# Patient Record
Sex: Female | Born: 1960
Health system: Southern US, Community
[De-identification: ages and names within clinical notes are randomized; demographics above are authoritative.]

## PROBLEM LIST (undated history)

## (undated) DIAGNOSIS — F418 Other specified anxiety disorders: Secondary | ICD-10-CM

## (undated) DIAGNOSIS — K219 Gastro-esophageal reflux disease without esophagitis: Secondary | ICD-10-CM

## (undated) DIAGNOSIS — M069 Rheumatoid arthritis, unspecified: Secondary | ICD-10-CM

## (undated) DIAGNOSIS — J309 Allergic rhinitis, unspecified: Secondary | ICD-10-CM

## (undated) DIAGNOSIS — L719 Rosacea, unspecified: Secondary | ICD-10-CM

## (undated) DIAGNOSIS — I4510 Unspecified right bundle-branch block: Secondary | ICD-10-CM

## (undated) HISTORY — PX: BREAST EXCISIONAL BIOPSY: SUR124

## (undated) HISTORY — DX: Gastro-esophageal reflux disease without esophagitis: K21.9

## (undated) HISTORY — PX: OTHER SURGICAL HISTORY: SHX169

## (undated) HISTORY — DX: Rheumatoid arthritis, unspecified: M06.9

## (undated) HISTORY — DX: Rosacea, unspecified: L71.9

## (undated) HISTORY — DX: Unspecified right bundle-branch block: I45.10

## (undated) HISTORY — DX: Allergic rhinitis, unspecified: J30.9

## (undated) HISTORY — DX: Other specified anxiety disorders: F41.8

---

## 1998-09-10 ENCOUNTER — Other Ambulatory Visit: Admission: RE | Admit: 1998-09-10 | Discharge: 1998-09-10 | Payer: Self-pay | Admitting: Obstetrics and Gynecology

## 1999-06-29 ENCOUNTER — Ambulatory Visit (HOSPITAL_COMMUNITY): Admission: RE | Admit: 1999-06-29 | Discharge: 1999-06-29 | Payer: Self-pay | Admitting: Family Medicine

## 1999-06-29 ENCOUNTER — Encounter: Payer: Self-pay | Admitting: Family Medicine

## 1999-11-19 ENCOUNTER — Ambulatory Visit (HOSPITAL_COMMUNITY): Admission: RE | Admit: 1999-11-19 | Discharge: 1999-11-19 | Payer: Self-pay | Admitting: Obstetrics and Gynecology

## 1999-11-19 ENCOUNTER — Encounter (INDEPENDENT_AMBULATORY_CARE_PROVIDER_SITE_OTHER): Payer: Self-pay

## 2000-08-30 ENCOUNTER — Other Ambulatory Visit: Admission: RE | Admit: 2000-08-30 | Discharge: 2000-08-30 | Payer: Self-pay | Admitting: Obstetrics and Gynecology

## 2002-02-19 ENCOUNTER — Other Ambulatory Visit: Admission: RE | Admit: 2002-02-19 | Discharge: 2002-02-19 | Payer: Self-pay | Admitting: Obstetrics and Gynecology

## 2003-01-24 ENCOUNTER — Ambulatory Visit (HOSPITAL_COMMUNITY): Admission: RE | Admit: 2003-01-24 | Discharge: 2003-01-24 | Payer: Self-pay | Admitting: Obstetrics and Gynecology

## 2003-01-24 ENCOUNTER — Encounter: Payer: Self-pay | Admitting: Obstetrics and Gynecology

## 2003-03-14 ENCOUNTER — Inpatient Hospital Stay (HOSPITAL_COMMUNITY): Admission: AD | Admit: 2003-03-14 | Discharge: 2003-03-14 | Payer: Self-pay | Admitting: Obstetrics and Gynecology

## 2003-03-24 ENCOUNTER — Encounter: Payer: Self-pay | Admitting: Obstetrics and Gynecology

## 2003-03-24 ENCOUNTER — Ambulatory Visit (HOSPITAL_COMMUNITY): Admission: RE | Admit: 2003-03-24 | Discharge: 2003-03-24 | Payer: Self-pay | Admitting: Obstetrics and Gynecology

## 2003-05-01 ENCOUNTER — Inpatient Hospital Stay (HOSPITAL_COMMUNITY): Admission: AD | Admit: 2003-05-01 | Discharge: 2003-05-01 | Payer: Self-pay | Admitting: Obstetrics and Gynecology

## 2003-05-01 ENCOUNTER — Encounter: Payer: Self-pay | Admitting: Obstetrics and Gynecology

## 2003-06-07 ENCOUNTER — Inpatient Hospital Stay (HOSPITAL_COMMUNITY): Admission: AD | Admit: 2003-06-07 | Discharge: 2003-06-09 | Payer: Self-pay | Admitting: Obstetrics and Gynecology

## 2003-10-06 ENCOUNTER — Other Ambulatory Visit: Admission: RE | Admit: 2003-10-06 | Discharge: 2003-10-06 | Payer: Self-pay | Admitting: Obstetrics and Gynecology

## 2004-11-30 ENCOUNTER — Other Ambulatory Visit: Admission: RE | Admit: 2004-11-30 | Discharge: 2004-11-30 | Payer: Self-pay | Admitting: Obstetrics and Gynecology

## 2004-12-21 ENCOUNTER — Ambulatory Visit (HOSPITAL_COMMUNITY): Admission: RE | Admit: 2004-12-21 | Discharge: 2004-12-21 | Payer: Self-pay | Admitting: Obstetrics and Gynecology

## 2006-02-21 ENCOUNTER — Other Ambulatory Visit: Admission: RE | Admit: 2006-02-21 | Discharge: 2006-02-21 | Payer: Self-pay | Admitting: Obstetrics and Gynecology

## 2006-03-22 ENCOUNTER — Ambulatory Visit (HOSPITAL_COMMUNITY): Admission: RE | Admit: 2006-03-22 | Discharge: 2006-03-22 | Payer: Self-pay | Admitting: Obstetrics and Gynecology

## 2006-10-06 ENCOUNTER — Ambulatory Visit (HOSPITAL_COMMUNITY): Admission: RE | Admit: 2006-10-06 | Discharge: 2006-10-06 | Payer: Self-pay | Admitting: *Deleted

## 2006-10-25 ENCOUNTER — Ambulatory Visit (HOSPITAL_COMMUNITY): Admission: RE | Admit: 2006-10-25 | Discharge: 2006-10-25 | Payer: Self-pay | Admitting: Gastroenterology

## 2006-11-06 ENCOUNTER — Encounter (INDEPENDENT_AMBULATORY_CARE_PROVIDER_SITE_OTHER): Payer: Self-pay | Admitting: *Deleted

## 2006-11-06 ENCOUNTER — Ambulatory Visit (HOSPITAL_COMMUNITY): Admission: RE | Admit: 2006-11-06 | Discharge: 2006-11-06 | Payer: Self-pay | Admitting: Gastroenterology

## 2008-02-08 ENCOUNTER — Ambulatory Visit (HOSPITAL_COMMUNITY): Admission: RE | Admit: 2008-02-08 | Discharge: 2008-02-08 | Payer: Self-pay | Admitting: Obstetrics and Gynecology

## 2009-12-29 ENCOUNTER — Ambulatory Visit (HOSPITAL_COMMUNITY): Admission: RE | Admit: 2009-12-29 | Discharge: 2009-12-29 | Payer: Self-pay | Admitting: Obstetrics and Gynecology

## 2010-01-03 ENCOUNTER — Emergency Department (HOSPITAL_COMMUNITY): Admission: EM | Admit: 2010-01-03 | Discharge: 2010-01-03 | Payer: Self-pay | Admitting: Emergency Medicine

## 2010-01-12 ENCOUNTER — Encounter: Admission: RE | Admit: 2010-01-12 | Discharge: 2010-01-12 | Payer: Self-pay | Admitting: Obstetrics and Gynecology

## 2010-02-09 ENCOUNTER — Ambulatory Visit (HOSPITAL_COMMUNITY): Admission: RE | Admit: 2010-02-09 | Discharge: 2010-02-09 | Payer: Self-pay | Admitting: Family Medicine

## 2010-11-21 ENCOUNTER — Encounter: Payer: Self-pay | Admitting: Obstetrics and Gynecology

## 2011-03-18 NOTE — Op Note (Signed)
NAMEAMELIA, BURGARD               ACCOUNT NO.:  [id number]   MEDICAL RECORD NO.:  [id number]          PATIENT TYPE:  AMB   LOCATION:  ENDO                         FACILITY:  [facility]   PHYSICIAN:  Petra Kuba, M.D.    DATE OF BIRTH:  1961/09/06   DATE OF PROCEDURE:  11/06/2006  DATE OF DISCHARGE:                               OPERATIVE REPORT   SURGEON:  Petra Kuba, M.D.   PROCEDURE:  Esophagogastroduodenoscopy with biopsy.   INDICATIONS:  Longstanding reflux.   CONSENT:  Consent was signed after risks, benefits, methods and options  were thoroughly discussed in the office.   MEDICINES USED:  Fentanyl 50, Versed 5.   DESCRIPTION OF PROCEDURE:  The video endoscope was inserted by direct  vision.  The esophagus was normal.  Maybe she had a tiny hiatal hernia,  but the esophagus was normal.  The scope passed into the stomach,  advanced through a normal antrum, normal pylorus into a normal duodenal  bulb and around the C loop to a normal second portion of the duodenum.  The scope was withdrawn back to the bulb, and a good look there ruled  out abnormalities in that location.  The scope was withdrawn back into  the stomach and retroflexed.  The cardia, fundus, angularis, lesser and  greater curve were all normal, except for a few fundic gland polyps or  hyperplastic-appearing polyps in the proximal stomach, all of which were  cold biopsied.  I believe there were 2 small and 3 tiny.  Straight  visualization of the stomach did not reveal any additional findings.  Air was suctioned.  The scope was slowly withdrawn, and, again, a good  look at the esophagus was normal.  The scope was removed.  The patient  tolerated the procedure well.  There was no obvious immediate  complication.   ENDOSCOPIC DIAGNOSES:  1. Questionable tiny hiatal hernia.  2. A few proximal gastric proximal, status post biopsy.  3. Otherwise, normal esophagogastroduodenoscopy.   PLAN:  1. Continue Protonix,  and change her to p.m. dosing, since most of her      symptoms are first thing in the morning.  2. Await pathology.  3. Call me p.r.n., otherwise, follow up in 2 months.  4. Possibly a nighttime dose of Reglan only would be hopeful.  5. Have her call me soon p.r.n.           ______________________________  Petra Kuba, M.D.     MEM/MEDQ  D:  11/06/2006  T:  11/06/2006  Job:  [id number]   cc:   Holley Bouche, M.D.

## 2011-03-18 NOTE — H&P (Signed)
NAME:  Gina Mitchell, Gina Mitchell                         ACCOUNT NO.:  [id number]   MEDICAL RECORD NO.:  [id number]                   PATIENT TYPE:  INP   LOCATION:  9162                                 FACILITY:  [facility]   PHYSICIAN:  Hal Morales, M.D.             DATE OF BIRTH:  09/03/1961   DATE OF ADMISSION:  06/07/2003  DATE OF DISCHARGE:                                HISTORY & PHYSICAL   HISTORY OF PRESENT ILLNESS:  Ms. Dutson is a 50 year old, married, Asian  female, G4, P2-0-1-2 at 38-5/7 weeks by ultrasound who presents complaining  of uterine contractions every five minutes throughout the night.  She denies  any leaking or vaginal bleeding.  She reports positive fetal movement.  She  denies any nausea, vomiting, headaches or visual disturbances.  Her  pregnancy has been followed at [facility] by the M.D. service  and has been at risk for advanced maternal age with no amniocentesis,  history of macrosomia with a previous delivery and a history of cervical  polyp.  Her Group B Streptococcus is negative with this pregnancy.  She  desires an epidural for labor with this pregnancy.   PAST OBSTETRICAL HISTORY:  She is a G4, P2-0-1-2 who delivered a viable  female infant in May 1996, who weighed 7 pounds 14 ounces at 40 weeks'  gestation following a 24-hour labor.  In December 1997, she delivered  another viable female infant that weighed 9 pounds 6 ounces at 40 weeks'  gestation following an 18-hour labor.  It is noted that she had some  shoulder dystocia with that delivery, but the patient does not remember  anything being a problem with the delivery.  In January 2001, she reports a  miscarriage with no complications.   PAST GYNECOLOGICAL HISTORY:  She has a history of cervical polyps.  No other  issues.   ALLERGIES:  CRAB gives her swelling.  IBUPROFEN gives her a rash.   PAST MEDICAL HISTORY:  1. She reports having had the usual childhood diseases.  2. She has  occasional urinary tract infection.  3. History of reflux and hiatal hernia.  4. Hospitalizations have been for childbirth only.   PAST SURGICAL HISTORY:  1. Right breast cyst removed in 1993.  2. D&E in 2001.   FAMILY HISTORY:  Significant for father with MI.  Mother with chronic  hypertension and varicosities.  Sister with thyroid dysfunction, otherwise  negative.   GENETIC HISTORY:  Negative, except the patient is age 59 with this  pregnancy.   PRENATAL LABORATORY DATA:  Blood type B positive, antibody screen negative.  Syphilis nonreactive.  Rubella immune.  Hepatitis B surface antigen is  negative.  GC and Chlamydia are both negative.  Her one-hour Glucola was  140.  Her three-hour GTT was within normal range.  Her 36-week beta strep  was negative.   PHYSICAL EXAMINATION:  VITAL SIGNS:  Stable.  She is afebrile.  HEENT:  Grossly within normal limits.  HEART:  Regular rate and rhythm.  CHEST:  Clear.  BREASTS:  Soft and nontender.  ABDOMEN:  Gravid with uterine contractions every five to six minutes.  Her  fetal heart rate is reactive and reassuring.  PELVIC:  Her pelvic exam is 4, 90%, vertex, -1 and bulging membranes.  EXTREMITIES:  Within normal limits.   ASSESSMENT:  1. Intrauterine pregnancy at term.  2. Early active labor.  3. Negative Group B Streptococcus.  4. The patient plans epidural for labor.   PLAN:  1. Admit to labor and delivery to follow routine physician orders.  2. Dr. Pennie Rushing has been notified of the patient's admission and will follow     the patient.  3. The patient may have an epidural when she desires.     Concha Pyo. Duplantis, C.N.M.              Hal Morales, M.D.    SJD/MEDQ  D:  06/07/2003  T:  06/07/2003  Job:  [id number]

## 2011-03-18 NOTE — Op Note (Signed)
[facility]  Patient:    Gina Mitchell                         MRN: [id number] Proc. Date: 11/19/99 Adm. Date:  [id number] Disc. Date: [id number] Attending:  Leonard Schwartz                           Operative Report  PREOPERATIVE DIAGNOSIS:       Missed abortion at 9 weeks.  POSTOPERATIVE DIAGNOSIS:      Missed abortion at 9 weeks.  PROCEDURE:                    Suction, dilatation and evacuation.  SURGEON:                      Janine Limbo, M.D.  ANESTHESIA:                   IV sedation and paracervical block.  DISPOSITION:                  Ms. Berhe is a 50 year old who presents at 9-weeks gestation with abnormal quantitative beta hCG values and with no IUP on ultrasound. Patient understands the indications for her procedure and she accepts the risks of, but not limited to, anesthetic complications, bleeding, infections and possible  damage to the surrounding organs.  FINDINGS:                     The patient is Rh-positive.  A moderate amount of  products of conception were removed from within a 10-week-size uterus.  DESCRIPTION OF PROCEDURE:     The patient was taken to the operating room where she was given medication through her IV line.  The patients abdomen, perineum and vagina were prepped with multiple layers of Betadine.  She was sterilely draped. Examination under anesthesia was performed.  A paracervical block was placed using 10 cc of 1% Xylocaine.  The uterus sounded to 11 cm.  The cervix was dilated. he uterine cavity was evacuated using a #10 suction curette and then a medium sharp curette.  The cavity was felt to be clean at the end of our procedure.  The patient tolerated her procedure well.  There was some bleeding from the cervix and a figure-of-eight suture of 0 Vicryl was placed.  Hemostasis was then felt to be adequate.  Sponge, needle and instrument counts were correct.  The  estimated blood loss was 10 cc.  Patient tolerated her procedure well.  She was taken to the recovery room in stable condition. DD:  11/19/99 TD:  11/23/99 Job: [id number]

## 2011-04-12 ENCOUNTER — Other Ambulatory Visit (HOSPITAL_COMMUNITY): Payer: Self-pay | Admitting: Obstetrics and Gynecology

## 2011-04-12 DIAGNOSIS — Z1231 Encounter for screening mammogram for malignant neoplasm of breast: Secondary | ICD-10-CM

## 2011-04-20 ENCOUNTER — Ambulatory Visit (HOSPITAL_COMMUNITY)
Admission: RE | Admit: 2011-04-20 | Discharge: 2011-04-20 | Disposition: A | Discharge status: Home / Self Care | Payer: Commercial Managed Care - PPO | Source: Ambulatory Visit | Attending: Obstetrics and Gynecology | Admitting: Obstetrics and Gynecology

## 2011-04-20 DIAGNOSIS — Z1231 Encounter for screening mammogram for malignant neoplasm of breast: Secondary | ICD-10-CM

## 2011-05-24 ENCOUNTER — Ambulatory Visit (HOSPITAL_COMMUNITY)
Admission: RE | Admit: 2011-05-24 | Discharge: 2011-05-24 | Disposition: A | Discharge status: Home / Self Care | Payer: 59 | Source: Ambulatory Visit | Attending: Family Medicine | Admitting: Family Medicine

## 2011-05-24 ENCOUNTER — Other Ambulatory Visit (HOSPITAL_COMMUNITY): Payer: Self-pay | Admitting: Family Medicine

## 2011-05-24 DIAGNOSIS — D259 Leiomyoma of uterus, unspecified: Secondary | ICD-10-CM | POA: Insufficient documentation

## 2011-05-24 DIAGNOSIS — N83209 Unspecified ovarian cyst, unspecified side: Secondary | ICD-10-CM | POA: Insufficient documentation

## 2011-05-24 DIAGNOSIS — R1032 Left lower quadrant pain: Secondary | ICD-10-CM | POA: Insufficient documentation

## 2011-05-24 DIAGNOSIS — N926 Irregular menstruation, unspecified: Secondary | ICD-10-CM | POA: Insufficient documentation

## 2012-09-24 ENCOUNTER — Other Ambulatory Visit: Payer: Self-pay | Admitting: Obstetrics and Gynecology

## 2012-09-24 DIAGNOSIS — Z1231 Encounter for screening mammogram for malignant neoplasm of breast: Secondary | ICD-10-CM

## 2012-10-01 ENCOUNTER — Ambulatory Visit (HOSPITAL_COMMUNITY)
Admission: RE | Admit: 2012-10-01 | Discharge: 2012-10-01 | Disposition: A | Discharge status: Home / Self Care | Payer: 59 | Source: Ambulatory Visit | Attending: Obstetrics and Gynecology | Admitting: Obstetrics and Gynecology

## 2012-10-01 DIAGNOSIS — Z1231 Encounter for screening mammogram for malignant neoplasm of breast: Secondary | ICD-10-CM | POA: Insufficient documentation

## 2012-10-04 ENCOUNTER — Encounter: Payer: Self-pay | Admitting: Obstetrics and Gynecology

## 2012-12-15 ENCOUNTER — Other Ambulatory Visit: Payer: Self-pay

## 2013-09-05 ENCOUNTER — Other Ambulatory Visit: Payer: Self-pay

## 2013-11-25 ENCOUNTER — Encounter: Payer: Self-pay | Admitting: Sports Medicine

## 2013-11-25 ENCOUNTER — Ambulatory Visit (INDEPENDENT_AMBULATORY_CARE_PROVIDER_SITE_OTHER): Payer: Commercial Managed Care - PPO | Admitting: Sports Medicine

## 2013-11-25 VITALS — BP 126/87 | HR 57 | Wt 119.0 lb

## 2013-11-25 DIAGNOSIS — M722 Plantar fascial fibromatosis: Secondary | ICD-10-CM

## 2013-11-25 MED ORDER — MELOXICAM 15 MG PO TABS: ORAL_TABLET | ORAL | Status: DC

## 2013-11-25 NOTE — Assessment & Plan Note (Signed)
We will start conservatively with Mobic, home rehabilitation, she will come back for custom orthotics.

## 2013-11-25 NOTE — Progress Notes (Signed)
  Subjective:    CC: Bilateral heel pain  HPI:  For the past several weeks this pleasant 52 year old female has noted pain that she localizes at both heels, worse in the morning, moderate, persistent without radiation. No trauma.  Past medical history, Surgical history, Family history not pertinant except as noted below, Social history, Allergies, and medications have been entered into the medical record, reviewed, and no changes needed.   Review of Systems: No headache, visual changes, nausea, vomiting, diarrhea, constipation, dizziness, abdominal pain, skin rash, fevers, chills, night sweats, swollen lymph nodes, weight loss, chest pain, body aches, joint swelling, muscle aches, shortness of breath, mood changes, visual or auditory hallucinations.  Objective:    General: Well Developed, well nourished, and in no acute distress.  Neuro: Alert and oriented x3, extra-ocular muscles intact, sensation grossly intact.  HEENT: Normocephalic, atraumatic, pupils equal round reactive to light, neck supple, no masses, no lymphadenopathy, thyroid nonpalpable.  Skin: Warm and dry, no rashes noted.  Cardiac: Regular rate and rhythm, no murmurs rubs or gallops.  Respiratory: Clear to auscultation bilaterally. Not using accessory muscles, speaking in full sentences.  Abdominal: Soft, nontender, nondistended, positive bowel sounds, no masses, no organomegaly.  Bilateral Foot: No visible erythema or swelling. Range of motion is full in all directions. Strength is 5/5 in all directions. No hallux valgus. No pes cavus or pes planus. Bilateral pes cavus.  No abnormal callus noted. No pain over the navicular prominence, or base of fifth metatarsal. Tender to palpation of the calcaneal insertion of plantar fascia. No pain at the Achilles insertion. No pain over the calcaneal bursa. No pain of the retrocalcaneal bursa. No tenderness to palpation over the tarsals, metatarsals, or phalanges. No hallux  rigidus or limitus. No tenderness palpation over interphalangeal joints. No pain with compression of the metatarsal heads. Neurovascularly intact distally.   Impression and Recommendations:    The patient was counselled, risk factors were discussed, anticipatory guidance given.

## 2013-11-27 ENCOUNTER — Other Ambulatory Visit: Payer: Self-pay | Admitting: Obstetrics and Gynecology

## 2013-11-27 DIAGNOSIS — Z1231 Encounter for screening mammogram for malignant neoplasm of breast: Secondary | ICD-10-CM

## 2013-12-02 ENCOUNTER — Ambulatory Visit (HOSPITAL_COMMUNITY): Payer: 59

## 2013-12-05 ENCOUNTER — Ambulatory Visit (HOSPITAL_COMMUNITY)
Admission: RE | Admit: 2013-12-05 | Discharge: 2013-12-05 | Disposition: A | Discharge status: Home / Self Care | Payer: 59 | Source: Ambulatory Visit | Attending: Obstetrics and Gynecology | Admitting: Obstetrics and Gynecology

## 2013-12-05 DIAGNOSIS — Z1231 Encounter for screening mammogram for malignant neoplasm of breast: Secondary | ICD-10-CM

## 2013-12-06 ENCOUNTER — Ambulatory Visit (INDEPENDENT_AMBULATORY_CARE_PROVIDER_SITE_OTHER): Payer: 59 | Admitting: Sports Medicine

## 2013-12-06 ENCOUNTER — Encounter: Payer: Self-pay | Admitting: Sports Medicine

## 2013-12-06 VITALS — BP 114/75 | HR 58 | Ht 62.0 in | Wt 118.0 lb

## 2013-12-06 DIAGNOSIS — M722 Plantar fascial fibromatosis: Secondary | ICD-10-CM

## 2013-12-06 NOTE — Progress Notes (Signed)
    Patient was fitted for a : standard, cushioned, semi-rigid orthotic. The orthotic was heated and afterward the patient stood on the orthotic blank positioned on the orthotic stand. The patient was positioned in subtalar neutral position and 10 degrees of ankle dorsiflexion in a weight bearing stance. After completion of molding, a stable base was applied to the orthotic blank. The blank was ground to a stable position for weight bearing. Size:6 Base: Blue EVA Additional Posting and Padding: None The patient ambulated these, and they were very comfortable.  I spent 40 minutes with this patient, greater than 50% was face-to-face time counseling regarding the below diagnosis.   

## 2013-12-06 NOTE — Assessment & Plan Note (Signed)
Custom orthotics as above. Return to see me in about a month, continue rehabilitation exercises and NSAIDs for plantar fasciitis.

## 2014-01-06 ENCOUNTER — Ambulatory Visit: Payer: 59 | Admitting: Sports Medicine

## 2014-04-03 ENCOUNTER — Ambulatory Visit (INDEPENDENT_AMBULATORY_CARE_PROVIDER_SITE_OTHER): Payer: 59 | Admitting: Cardiovascular Disease

## 2014-04-03 ENCOUNTER — Encounter: Payer: Self-pay | Admitting: Cardiovascular Disease

## 2014-04-03 VITALS — BP 142/92 | HR 60 | Ht 62.0 in | Wt 114.0 lb

## 2014-04-03 DIAGNOSIS — F411 Generalized anxiety disorder: Secondary | ICD-10-CM

## 2014-04-03 DIAGNOSIS — F419 Anxiety disorder, unspecified: Secondary | ICD-10-CM

## 2014-04-03 DIAGNOSIS — R079 Chest pain, unspecified: Secondary | ICD-10-CM

## 2014-04-03 MED ORDER — ALPRAZOLAM 0.25 MG PO TABS: 0.2500 mg | ORAL_TABLET | Freq: Four times a day (QID) | ORAL | Status: DC | PRN

## 2014-04-03 NOTE — Progress Notes (Signed)
     History of Present Illness: 53 yo female with history of GERD here today for evaluation of chest pain. She tells me that she has had chest pain for the last 3 weeks. She saw Dr. Kenton Kingfisher 03/11/14 and her EKG was ok. She describes a pressure in her chest, centrally located. This has also occurred at night while lying in bed. No exertional chest pain. Mild dyspnea with exertion. No near syncope or syncope. She feels lightheaded at times. She has been under much stress. Her father died several days ago. Her sister recently had a diagnosis of breast cancer. No prior heart disease. No FH of CAD.   Primary Care Physician: Kenton Kingfisher  Past Medical History  Diagnosis Date  . Esophageal reflux     Past Surgical History  Procedure Laterality Date  . Breast mass removal      Current Outpatient Prescriptions  Medication Sig Dispense Refill  . amoxicillin (AMOXIL) 875 MG tablet Take 875 mg by mouth 2 (two) times daily. hasn't started yet      . Multiple Vitamins-Minerals (MULTIVITAL PO) Take by mouth.      . pantoprazole (PROTONIX) 40 MG tablet Take 40 mg by mouth daily.       No current facility-administered medications for this visit.    Allergies  Allergen Reactions  . Crab [Shellfish Allergy]     History   Social History  . Marital Status: Married    Spouse Name: N/A    Number of Children: 3  . Years of Education: N/A   Occupational History  . RN [employer]   Social History Main Topics  . Smoking status: Never Smoker   . Smokeless tobacco: Not on file  . Alcohol Use: No  . Drug Use: No  . Sexual Activity: Not on file   Other Topics Concern  . Not on file   Social History Narrative  . No narrative on file    Family History  Problem Relation Age of Onset  . Prostate cancer Father   . Breast cancer Sister   . Hypertension Mother     Review of Systems:  As stated in the HPI and otherwise negative.   BP 142/92  Pulse 60  Ht 5\' 2"  (1.575 m)  Wt 114 lb (51.71 kg)   BMI 20.85 kg/m2  Physical Examination: General: Well developed, well nourished, NAD HEENT: OP clear, mucus membranes moist SKIN: warm, dry. No rashes. Neuro: No focal deficits Musculoskeletal: Muscle strength 5/5 all ext Psychiatric: Mood and affect normal Neck: No JVD, no carotid bruits, no thyromegaly, no lymphadenopathy. Lungs:Clear bilaterally, no wheezes, rhonci, crackles Cardiovascular: Regular rate and rhythm. No murmurs, gallops or rubs. Abdomen:Soft. Bowel sounds present. Non-tender.  Extremities: No lower extremity edema. Pulses are 2 + in the bilateral DP/PT.  EKG: Sinus, rate 60 bpm. Lead III with small R wave. No obvious Qwaves  Assessment and Plan:   1. Chest pain: Her pain is atypical. She also has dizziness. She is very anxious with the death of her father and pending funeral. I think her chest pain is likely related to her GERD. Will get echo to assess LV function, wall motion, exclude pericardial effusion. I do not think ischemic testing is necessary. She will continue Protonix and will start Pepcid as well for likely GERD.   2. Anxiety: Will give her Xanax to use prn for her flight to the [place].

## 2014-04-03 NOTE — Patient Instructions (Signed)
Your physician recommends that you schedule a follow-up appointment in: about 8 weeks.   Your physician has requested that you have an echocardiogram. Echocardiography is a painless test that uses sound waves to create images of your heart. It provides your doctor with information about the size and shape of your heart and how well your heart's chambers and valves are working. This procedure takes approximately one hour. There are no restrictions for this procedure. Scheduled for April 04, 2014 at 8:00 at the [facility] office.

## 2014-04-04 ENCOUNTER — Telehealth: Payer: Self-pay | Admitting: Cardiovascular Disease

## 2014-04-04 ENCOUNTER — Ambulatory Visit (HOSPITAL_COMMUNITY)
Admission: RE | Admit: 2014-04-04 | Discharge: 2014-04-04 | Disposition: A | Discharge status: Home / Self Care | Payer: 59 | Source: Ambulatory Visit | Attending: Cardiovascular Disease | Admitting: Cardiovascular Disease

## 2014-04-04 DIAGNOSIS — R079 Chest pain, unspecified: Secondary | ICD-10-CM | POA: Insufficient documentation

## 2014-04-04 DIAGNOSIS — I369 Nonrheumatic tricuspid valve disorder, unspecified: Secondary | ICD-10-CM

## 2014-04-04 NOTE — Progress Notes (Signed)
2D Echocardiogram Complete.  04/04/2014   Staley Budzinski [place], [place]

## 2014-04-04 NOTE — Telephone Encounter (Signed)
Spoke with pt and answered medication question and reviewed echo results with her.

## 2014-04-04 NOTE — Telephone Encounter (Signed)
New Prob    Pt has some questions regarding medications. Please call.

## 2014-05-05 ENCOUNTER — Institutional Professional Consult (permissible substitution): Payer: 59 | Admitting: Interventional Cardiology

## 2014-06-10 ENCOUNTER — Ambulatory Visit (INDEPENDENT_AMBULATORY_CARE_PROVIDER_SITE_OTHER): Payer: 59 | Admitting: Cardiovascular Disease

## 2014-06-10 ENCOUNTER — Encounter: Payer: Self-pay | Admitting: Cardiovascular Disease

## 2014-06-10 VITALS — BP 144/94 | HR 54 | Ht 61.0 in | Wt 115.1 lb

## 2014-06-10 DIAGNOSIS — R079 Chest pain, unspecified: Secondary | ICD-10-CM

## 2014-06-10 NOTE — Patient Instructions (Signed)
Your physician recommends that you schedule a follow-up appointment  As needed with Dr. Angelena Form  Your physician has requested that you have an exercise tolerance test. For further information please visit [email or web address]. Please also follow instruction sheet, as given.

## 2014-06-10 NOTE — Progress Notes (Signed)
History of Present Illness: 53 yo female with history of GERD here today for cardiac follow up. She was seen as a new patient 04/03/14 for evaluation of chest pain. She saw Dr. Kenton Kingfisher 03/11/14 and her EKG was ok. She described a pressure in her chest, centrally located. This has also occurred at night while lying in bed. No exertional chest pain. Mild dyspnea with exertion. No near syncope or syncope. She feels lightheaded at times. She has been under much stress. Her father died just before her first visit here. Her sister recently had a diagnosis of breast cancer. No prior heart disease. No FH of CAD. I arranged an echo on 04/04/14 which showed normal LV size and function, no significant valve issues. She was continued on PPI for possible GERD.   She is here today for follow up. Still having chest pain when lying in bed at night. Described as mild pressure. No SOB. Anxiety is better. Still having hard time dealing with loss of her father. Just went back to work. Some dizziness.   Primary Care Physician: Kenton Kingfisher  Past Medical History  Diagnosis Date  . Esophageal reflux     Past Surgical History  Procedure Laterality Date  . Breast mass removal      Current Outpatient Prescriptions  Medication Sig Dispense Refill  . famotidine (PEPCID AC) 10 MG chewable tablet Chew 10 mg by mouth daily as needed for heartburn.      . pantoprazole (PROTONIX) 40 MG tablet Take 40 mg by mouth daily.       No current facility-administered medications for this visit.    Allergies  Allergen Reactions  . Crab [Shellfish Allergy]     History   Social History  . Marital Status: Married    Spouse Name: N/A    Number of Children: 3  . Years of Education: N/A   Occupational History  . RN [employer]   Social History Main Topics  . Smoking status: Never Smoker   . Smokeless tobacco: Not on file  . Alcohol Use: No  . Drug Use: No  . Sexual Activity: Not on file   Other Topics Concern  . Not on  file   Social History Narrative  . No narrative on file    Family History  Problem Relation Age of Onset  . Prostate cancer Father   . Breast cancer Sister   . Hypertension Mother     Review of Systems:  As stated in the HPI and otherwise negative.   BP 144/94  Pulse 54  Ht 5\' 1"  (1.549 m)  Wt 115 lb 1.9 oz (52.218 kg)  BMI 21.76 kg/m2  Physical Examination: General: Well developed, well nourished, NAD HEENT: OP clear, mucus membranes moist SKIN: warm, dry. No rashes. Neuro: No focal deficits Musculoskeletal: Muscle strength 5/5 all ext Psychiatric: Mood and affect normal Neck: No JVD, no carotid bruits, no thyromegaly, no lymphadenopathy. Lungs:Clear bilaterally, no wheezes, rhonci, crackles Cardiovascular: Regular rate and rhythm. No murmurs, gallops or rubs. Abdomen:Soft. Bowel sounds present. Non-tender.  Extremities: No lower extremity edema. Pulses are 2 + in the bilateral DP/PT.  Echo 04/04/14: Left ventricle: The cavity size was normal. Wall thickness was normal. Systolic function was normal. The estimated ejection fraction was in the range of 55% to 60%. Wall motion was normal; there were no regional wall motion abnormalities. Left ventricular diastolic function parameters were normal. Impressions: Normal LV function; mild TR.  Assessment and Plan:   1. Chest  pain: Her pain is atypical. I think her chest pain is likely related to her GERD as it occurs mostly at night while lying in bed. Echo with normal LV size and function. Since she is still having chest pain, will arrange exercise stress test to exclude ischemia. Continue Protonix.

## 2014-06-30 ENCOUNTER — Ambulatory Visit (INDEPENDENT_AMBULATORY_CARE_PROVIDER_SITE_OTHER): Payer: 59 | Admitting: Physician Assistant

## 2014-06-30 DIAGNOSIS — R079 Chest pain, unspecified: Secondary | ICD-10-CM

## 2014-06-30 NOTE — Progress Notes (Signed)
thanks

## 2014-06-30 NOTE — Progress Notes (Signed)
Exercise Treadmill Test  Pre-Exercise Testing Evaluation Rhythm: normal sinus  Rate: 65     Test  Exercise Tolerance Test Ordering MD: Murvin Natal, MD  Interpreting MD: Estella Husk, PA-C  Unique Test No: 1  Treadmill:  1  Indication for ETT: chest pain - rule out ischemia  Contraindication to ETT: No   Stress Modality: exercise - treadmill  Cardiac Imaging Performed: non   Protocol: standard Bruce - maximal  Max BP:  211/117 (199/124)  Max MPHR (bpm):  168 85% MPR (bpm):  143  MPHR obtained (bpm):  153 % MPHR obtained:  87  Reached 85% MPHR (min:sec):  8:15 Total Exercise Time (min-sec):  9:00  Workload in METS: 10.1 Borg Scale: 15  Reason ETT Terminated:  hypertension    ST Segment Analysis At Rest: normal ST segments - no evidence of significant ST depression With Exercise: no evidence of significant ST depression  Other Information Arrhythmia:  No Angina during ETT:  absent (0) Quality of ETT:  diagnostic  ETT Interpretation:  normal - no evidence of ischemia by ST analysis  Comments: Mildly hypertensive to start but BP went very high with exercise. Patient thinks it's anxiety. No Chest pain. Some dyspnea on exertion. Good exercise tolerance. Could have gone longer but stopped because of elevated BP. Came down quickly after stopping.  Recommendations: Follow up with primary for HTN.

## 2014-10-08 ENCOUNTER — Ambulatory Visit: Payer: 59 | Admitting: Physician Assistant

## 2014-10-20 ENCOUNTER — Telehealth: Payer: Self-pay | Admitting: *Deleted

## 2014-10-20 NOTE — Telephone Encounter (Signed)
msg left to request records.Marland Kitchen

## 2014-10-20 NOTE — Telephone Encounter (Signed)
Second request via vm for records.Marland KitchenMarland Kitchen

## 2014-10-23 ENCOUNTER — Ambulatory Visit: Payer: 59 | Admitting: Physician Assistant

## 2014-11-06 ENCOUNTER — Ambulatory Visit (INDEPENDENT_AMBULATORY_CARE_PROVIDER_SITE_OTHER): Payer: 59 | Admitting: Cardiology

## 2014-11-06 ENCOUNTER — Encounter: Payer: Self-pay | Admitting: Cardiology

## 2014-11-06 VITALS — BP 118/86 | HR 54 | Ht 61.0 in | Wt 120.1 lb

## 2014-11-06 DIAGNOSIS — R079 Chest pain, unspecified: Secondary | ICD-10-CM

## 2014-11-06 DIAGNOSIS — R9431 Abnormal electrocardiogram [ECG] [EKG]: Secondary | ICD-10-CM

## 2014-11-06 NOTE — Assessment & Plan Note (Signed)
This sounds atypical for angina

## 2014-11-06 NOTE — Progress Notes (Signed)
    11/06/2014 Gina Mitchell   January 24, 1961  [id number]  Primary Physician Shirline Frees, MD Primary Cardiologist: Dr Julianne Handler  HPI:  54 y/o female, works at [employer], referred by her PCP for an abnormal EKG. The pt has been evaluated by Dr Julianne Handler previously for chest pain. She had a normal echo in June and a negative treadmill in August. She gives a history of intermittent chest pain, non exertional, improved with PPI (which she has subsequently stopped). She works out three times a week for an hour- cycling, Zumba, weights. She has no chest pain with these activities. Her EKG looks unchanged from previous tracings.    Current Outpatient Prescriptions  Medication Sig Dispense Refill  . famotidine (PEPCID AC) 10 MG chewable tablet Chew 10 mg by mouth daily as needed for heartburn.    . pantoprazole (PROTONIX) 40 MG tablet Take 40 mg by mouth daily.    Marland Kitchen ALPRAZolam (XANAX) 0.5 MG tablet Take 1 tablet by mouth as needed.  0   No current facility-administered medications for this visit.    Allergies  Allergen Reactions  . Crab [Shellfish Allergy]     History   Social History  . Marital Status: Married    Spouse Name: N/A    Number of Children: 3  . Years of Education: N/A   Occupational History  . RN [employer]   Social History Main Topics  . Smoking status: Never Smoker   . Smokeless tobacco: Not on file  . Alcohol Use: No  . Drug Use: No  . Sexual Activity: Not on file   Other Topics Concern  . Not on file   Social History Narrative     Review of Systems: General: negative for chills, fever, night sweats or weight changes.  Cardiovascular: negative for chest pain, dyspnea on exertion, edema, orthopnea, palpitations, paroxysmal nocturnal dyspnea or shortness of breath Dermatological: negative for rash Respiratory: negative for cough or wheezing Urologic: negative for hematuria Abdominal: negative for nausea, vomiting, diarrhea, bright red blood per rectum, melena,  or hematemesis Neurologic: negative for visual changes, syncope, or dizziness All other systems reviewed and are otherwise negative except as noted above.    Blood pressure 118/86, pulse 54, height 5\' 1"  (1.549 m), weight 120 lb 1.6 oz (54.477 kg).  General appearance: alert, cooperative, no distress and anxious Lungs: clear to auscultation bilaterally Heart: regular rate and rhythm  EKG NSR Q V2  ASSESSMENT AND PLAN:   Abnormal EKG Referred by her PCP for an abnormal EKG  Chest pain This sounds atypical for angina   PLAN  Reassurance. She will let us know if her symptoms change.  [facility] KPA-C 11/06/2014 4:17 PM

## 2014-11-06 NOTE — Assessment & Plan Note (Signed)
Referred by her PCP for an abnormal EKG

## 2014-11-06 NOTE — Patient Instructions (Signed)
Call us if your symptoms change or become more exertional

## 2014-11-07 ENCOUNTER — Ambulatory Visit: Payer: 59 | Admitting: Nurse Practitioner

## 2014-11-10 ENCOUNTER — Encounter: Payer: Self-pay | Admitting: Cardiology

## 2014-11-10 ENCOUNTER — Encounter: Payer: Self-pay | Admitting: *Deleted

## 2015-02-02 ENCOUNTER — Other Ambulatory Visit (HOSPITAL_COMMUNITY): Payer: Self-pay | Admitting: Obstetrics and Gynecology

## 2015-02-02 DIAGNOSIS — Z1231 Encounter for screening mammogram for malignant neoplasm of breast: Secondary | ICD-10-CM

## 2015-02-09 ENCOUNTER — Ambulatory Visit (HOSPITAL_COMMUNITY)
Admission: RE | Admit: 2015-02-09 | Discharge: 2015-02-09 | Disposition: A | Discharge status: Home / Self Care | Payer: 59 | Source: Ambulatory Visit | Attending: Obstetrics and Gynecology | Admitting: Obstetrics and Gynecology

## 2015-02-09 DIAGNOSIS — Z1231 Encounter for screening mammogram for malignant neoplasm of breast: Secondary | ICD-10-CM | POA: Diagnosis present

## 2015-11-19 MED FILL — PANTOPRAZOLE SOD DR 40 MG T: 40 | 90 days supply | Qty: 90 | Fill #0 | Status: TO

## 2015-11-27 MED FILL — AMOXICILLIN 500 MG CAPSULE: 500 | 7 days supply | Qty: 30 | Fill #0

## 2015-12-08 ENCOUNTER — Other Ambulatory Visit (HOSPITAL_COMMUNITY): Payer: Self-pay | Admitting: Family Medicine

## 2015-12-08 DIAGNOSIS — Z0000 Encounter for general adult medical examination without abnormal findings: Secondary | ICD-10-CM | POA: Diagnosis not present

## 2015-12-08 DIAGNOSIS — R1013 Epigastric pain: Secondary | ICD-10-CM | POA: Diagnosis not present

## 2015-12-08 DIAGNOSIS — E7800 Pure hypercholesterolemia, unspecified: Secondary | ICD-10-CM | POA: Diagnosis not present

## 2015-12-08 DIAGNOSIS — K219 Gastro-esophageal reflux disease without esophagitis: Secondary | ICD-10-CM | POA: Diagnosis not present

## 2015-12-08 DIAGNOSIS — Z1211 Encounter for screening for malignant neoplasm of colon: Secondary | ICD-10-CM | POA: Diagnosis not present

## 2015-12-10 ENCOUNTER — Ambulatory Visit (HOSPITAL_COMMUNITY)
Admission: RE | Admit: 2015-12-10 | Discharge: 2015-12-10 | Disposition: A | Discharge status: Home / Self Care | Payer: 59 | Source: Ambulatory Visit | Attending: Family Medicine | Admitting: Family Medicine

## 2015-12-10 DIAGNOSIS — R1013 Epigastric pain: Secondary | ICD-10-CM | POA: Diagnosis not present

## 2016-02-08 DIAGNOSIS — H11121 Conjunctival concretions, right eye: Secondary | ICD-10-CM | POA: Diagnosis not present

## 2016-03-10 MED FILL — PANTOPRAZOLE SOD DR 40 MG T: 40 | 90 days supply | Qty: 90 | Fill #0

## 2016-04-19 ENCOUNTER — Ambulatory Visit (INDEPENDENT_AMBULATORY_CARE_PROVIDER_SITE_OTHER): Payer: 59

## 2016-04-19 ENCOUNTER — Encounter: Payer: Self-pay | Admitting: Sports Medicine

## 2016-04-19 ENCOUNTER — Ambulatory Visit (INDEPENDENT_AMBULATORY_CARE_PROVIDER_SITE_OTHER): Payer: 59 | Admitting: Sports Medicine

## 2016-04-19 VITALS — BP 125/83 | HR 69 | Wt 119.0 lb

## 2016-04-19 DIAGNOSIS — M79672 Pain in left foot: Secondary | ICD-10-CM | POA: Diagnosis not present

## 2016-04-19 DIAGNOSIS — M25572 Pain in left ankle and joints of left foot: Secondary | ICD-10-CM

## 2016-04-19 DIAGNOSIS — M84375A Stress fracture, left foot, initial encounter for fracture: Secondary | ICD-10-CM

## 2016-04-19 NOTE — Progress Notes (Signed)
   Subjective:    I'm seeing this patient as a consultation for:  Dr. 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 For the past several weeks she's had increasing pain and swelling at she localizes on the dorsal forefoot on the left side, specifically over the third metatarsal shaft distally. Pain is moderate, persistent without radiation. Worse with weightbearing.  Past medical history, Surgical history, Family history not pertinant except as noted below, Social history, Allergies, and medications have been entered into the medical record, reviewed, and no changes needed.   Review of Systems: No headache, visual changes, nausea, vomiting, diarrhea, constipation, dizziness, abdominal pain, skin rash, fevers, chills, night sweats, weight loss, swollen lymph nodes, body aches, joint swelling, muscle aches, chest pain, shortness of breath, mood changes, visual or auditory hallucinations.   Objective:   General: Well Developed, well nourished, and in no acute distress.  Neuro/Psych: Alert and oriented x3, extra-ocular muscles intact, able to move all 4 extremities, sensation grossly intact. Skin: Warm and dry, no rashes noted.  Respiratory: Not using accessory muscles, speaking in full sentences, trachea midline.  Cardiovascular: Pulses palpable, no extremity edema. Abdomen: Does not appear distended. Left Foot: Visibly swollen over the forefoot with loss of landmarks Range of motion is full in all directions. Strength is 5/5 in all directions. No hallux valgus. No pes cavus or pes planus. No abnormal callus noted. No pain over the navicular prominence, or base of fifth metatarsal. No tenderness to palpation of the calcaneal insertion of plantar fascia. No pain at the Achilles insertion. No pain over the calcaneal bursa. No pain of the retrocalcaneal bursa. Tender to palpation over  the distal third metatarsal No hallux rigidus or limitus. No tenderness palpation over interphalangeal joints. No pain with compression of the metatarsal heads. Neurovascularly intact distally.  Impression and Recommendations:   This case required medical decision making of moderate complexity.

## 2016-04-19 NOTE — Assessment & Plan Note (Signed)
Pain over the left third metatarsal shafts distally. There is swelling over the dorsal forefoot highly suggestive of stress injury. 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 CAM boot, x-rays, calcium and vitamin D supplement, return to see me in one month, MRI if no better.

## 2016-04-21 ENCOUNTER — Telehealth: Payer: Self-pay

## 2016-04-21 NOTE — Telephone Encounter (Signed)
Gina Mitchell is not interested in the knee scooter. She states there are no nonweightbearing jobs she can do. She would like to be written out of work for the rest of the week.

## 2016-04-21 NOTE — Telephone Encounter (Signed)
Letter is in box. 

## 2016-04-21 NOTE — Telephone Encounter (Signed)
Patient advised of recommendations.  

## 2016-04-21 NOTE — Telephone Encounter (Signed)
There is likely a stress fracture, if she still has pain in the boot then she should be nonweightbearing, if she can work with a rolling knee scooter I can order one of those, otherwise she should do seated duty for the next few weeks.

## 2016-04-21 NOTE — Telephone Encounter (Signed)
Gina Mitchell called and ask if it is ok to work 12 hour shifts on her feet. She is still having a lot of pain. Please advise.

## 2016-04-25 ENCOUNTER — Telehealth: Payer: Self-pay

## 2016-04-25 NOTE — Telephone Encounter (Signed)
I am going to switch to a postop shoe, she should return in a nurse visit for the postop shoe.

## 2016-04-25 NOTE — Telephone Encounter (Signed)
Gina Mitchell called and left a message stating the cam boot is causing her to have knee soreness. Is there a different boot/wrap she could use?

## 2016-04-26 ENCOUNTER — Ambulatory Visit (INDEPENDENT_AMBULATORY_CARE_PROVIDER_SITE_OTHER): Payer: 59 | Admitting: Sports Medicine

## 2016-04-26 VITALS — BP 141/91 | HR 63

## 2016-04-26 DIAGNOSIS — M84375A Stress fracture, left foot, initial encounter for fracture: Secondary | ICD-10-CM

## 2016-04-26 NOTE — Progress Notes (Signed)
Patient came into clinic today for fitting of post op shoe. Pt was fitted comfortably in XS post op shoe, no further questions/concerns. Pt advised to keep follow up appt as scheduled.

## 2016-04-26 NOTE — Telephone Encounter (Signed)
Patient advised and scheduled.  

## 2016-05-02 ENCOUNTER — Telehealth: Payer: Self-pay

## 2016-05-02 NOTE — Telephone Encounter (Signed)
I have added a tentative date of 05/19/2016 for return to work, we can always change this at a follow-up visit if needed.

## 2016-05-02 NOTE — Telephone Encounter (Signed)
Pt left VM asking for a definitive date on her FMLA paperwork for her stress fracture. Please assist.

## 2016-05-04 NOTE — Telephone Encounter (Signed)
Pt.notified

## 2016-05-09 ENCOUNTER — Other Ambulatory Visit: Payer: Self-pay | Admitting: Obstetrics and Gynecology

## 2016-05-09 DIAGNOSIS — Z1231 Encounter for screening mammogram for malignant neoplasm of breast: Secondary | ICD-10-CM

## 2016-05-13 ENCOUNTER — Encounter: Payer: Self-pay | Admitting: Sports Medicine

## 2016-05-13 ENCOUNTER — Ambulatory Visit (INDEPENDENT_AMBULATORY_CARE_PROVIDER_SITE_OTHER): Payer: 59 | Admitting: Sports Medicine

## 2016-05-13 VITALS — BP 132/84 | HR 64 | Resp 18 | Wt 120.7 lb

## 2016-05-13 DIAGNOSIS — M84375D Stress fracture, left foot, subsequent encounter for fracture with routine healing: Secondary | ICD-10-CM | POA: Diagnosis not present

## 2016-05-13 NOTE — Progress Notes (Signed)
  Subjective:    CC: Paperwork  HPI: This is a pleasant 55 year old female with a left metatarsal stress fracture, she's been in a postop shoe now for 3 weeks and is approximately 80% better, she does have some metatarsalgia type symptoms.  Past medical history, Surgical history, Family history not pertinant except as noted below, Social history, Allergies, and medications have been entered into the medical record, reviewed, and no changes needed.   Review of Systems: No fevers, chills, night sweats, weight loss, chest pain, or shortness of breath.   Objective:    General: Well Developed, well nourished, and in no acute distress.  Neuro: Alert and oriented x3, extra-ocular muscles intact, sensation grossly intact.  HEENT: Normocephalic, atraumatic, pupils equal round reactive to light, neck supple, no masses, no lymphadenopathy, thyroid nonpalpable.  Skin: Warm and dry, no rashes. Cardiac: Regular rate and rhythm, no murmurs rubs or gallops, no lower extremity edema.  Respiratory: Clear to auscultation bilaterally. Not using accessory muscles, speaking in full sentences.  Metatarsal pad placed in postop shoe.  A great deal of time was spent filling out short-term disability paperwork  Impression and Recommendations:    I spent 25 minutes with this patient, greater than 50% was face-to-face time counseling regarding the above diagnoses

## 2016-05-13 NOTE — Assessment & Plan Note (Signed)
80% better, added metatarsal pad, she was having some metatarsalgia. Continue postop shoe she will return to work on July 24 in the seated duty for another 3 weeks. Short-term disability forms filled out today.  Return to see me in one month.

## 2016-05-17 ENCOUNTER — Ambulatory Visit: Payer: 59 | Admitting: Sports Medicine

## 2016-05-18 ENCOUNTER — Ambulatory Visit
Admission: RE | Admit: 2016-05-18 | Discharge: 2016-05-18 | Disposition: A | Discharge status: Home / Self Care | Payer: 59 | Source: Ambulatory Visit | Attending: Obstetrics and Gynecology | Admitting: Obstetrics and Gynecology

## 2016-05-18 DIAGNOSIS — Z1231 Encounter for screening mammogram for malignant neoplasm of breast: Secondary | ICD-10-CM

## 2016-05-24 DIAGNOSIS — Z6822 Body mass index (BMI) 22.0-22.9, adult: Secondary | ICD-10-CM | POA: Diagnosis not present

## 2016-05-24 DIAGNOSIS — M5412 Radiculopathy, cervical region: Secondary | ICD-10-CM | POA: Diagnosis not present

## 2016-05-24 DIAGNOSIS — F419 Anxiety disorder, unspecified: Secondary | ICD-10-CM | POA: Diagnosis not present

## 2016-05-24 DIAGNOSIS — F411 Generalized anxiety disorder: Secondary | ICD-10-CM | POA: Diagnosis not present

## 2016-05-24 DIAGNOSIS — M545 Low back pain: Secondary | ICD-10-CM | POA: Diagnosis not present

## 2016-05-24 DIAGNOSIS — R5383 Other fatigue: Secondary | ICD-10-CM | POA: Diagnosis not present

## 2016-05-24 DIAGNOSIS — Z124 Encounter for screening for malignant neoplasm of cervix: Secondary | ICD-10-CM | POA: Diagnosis not present

## 2016-05-24 DIAGNOSIS — M542 Cervicalgia: Secondary | ICD-10-CM | POA: Diagnosis not present

## 2016-05-24 DIAGNOSIS — Z01419 Encounter for gynecological examination (general) (routine) without abnormal findings: Secondary | ICD-10-CM | POA: Diagnosis not present

## 2016-05-24 MED FILL — ALPRAZolam 0.25 MG TABS: 0.25 | 10 days supply | Qty: 10 | Fill #0

## 2016-05-24 MED FILL — MELOXICAM 15 MG TABLET: 15 | 30 days supply | Qty: 30 | Fill #0

## 2016-05-25 DIAGNOSIS — Z124 Encounter for screening for malignant neoplasm of cervix: Secondary | ICD-10-CM | POA: Diagnosis not present

## 2016-06-10 ENCOUNTER — Encounter: Payer: Self-pay | Admitting: Sports Medicine

## 2016-06-10 ENCOUNTER — Ambulatory Visit (INDEPENDENT_AMBULATORY_CARE_PROVIDER_SITE_OTHER): Payer: 59 | Admitting: Sports Medicine

## 2016-06-10 DIAGNOSIS — M8580 Other specified disorders of bone density and structure, unspecified site: Secondary | ICD-10-CM | POA: Diagnosis not present

## 2016-06-10 DIAGNOSIS — M84375D Stress fracture, left foot, subsequent encounter for fracture with routine healing: Secondary | ICD-10-CM | POA: Diagnosis not present

## 2016-06-10 NOTE — Progress Notes (Signed)
  Subjective:    CC: Follow-up  HPI: Gina Mitchell returns, she's been on seated duty for almost 2 months now, any postop shoe. She tells me her pain is essentially gone. She would like to go back to work.  Past medical history, Surgical history, Family history not pertinant except as noted below, Social history, Allergies, and medications have been entered into the medical record, reviewed, and no changes needed.   Review of Systems: No fevers, chills, night sweats, weight loss, chest pain, or shortness of breath.   Objective:    General: Well Developed, well nourished, and in no acute distress.  Neuro: Alert and oriented x3, extra-ocular muscles intact, sensation grossly intact.  HEENT: Normocephalic, atraumatic, pupils equal round reactive to light, neck supple, no masses, no lymphadenopathy, thyroid nonpalpable.  Skin: Warm and dry, no rashes. Cardiac: Regular rate and rhythm, no murmurs rubs or gallops, no lower extremity edema.  Respiratory: Clear to auscultation bilaterally. Not using accessory muscles, speaking in full sentences. Left Foot: Only minimally swollen Range of motion is full in all directions. Strength is 5/5 in all directions. No hallux valgus. No pes cavus or pes planus. No abnormal callus noted. No pain over the navicular prominence, or base of fifth metatarsal. No tenderness to palpation of the calcaneal insertion of plantar fascia. No pain at the Achilles insertion. No pain over the calcaneal bursa. No pain of the retrocalcaneal bursa. No longer tender over the metatarsals No hallux rigidus or limitus. No tenderness palpation over interphalangeal joints. No pain with compression of the metatarsal heads. Neurovascularly intact distally.  Impression and Recommendations:    Metatarsal stress fracture of left foot Essentially pain-free now after almost 2 months in the postop shoe. She will transition back into her regular shoe with orthotics and return to  work. I have recommended a calcium and vitamin D supplement twice a day.  I spent 25 minutes with this patient, greater than 50% was face-to-face time counseling regarding the above diagnoses

## 2016-06-10 NOTE — Assessment & Plan Note (Signed)
Essentially pain-free now after almost 2 months in the postop shoe. She will transition back into her regular shoe with orthotics and return to work. I have recommended a calcium and vitamin D supplement twice a day.

## 2016-06-22 DIAGNOSIS — H11131 Conjunctival pigmentations, right eye: Secondary | ICD-10-CM | POA: Diagnosis not present

## 2016-08-01 ENCOUNTER — Encounter: Payer: Self-pay | Admitting: Sports Medicine

## 2016-08-01 ENCOUNTER — Ambulatory Visit (INDEPENDENT_AMBULATORY_CARE_PROVIDER_SITE_OTHER): Payer: 59 | Admitting: Sports Medicine

## 2016-08-01 DIAGNOSIS — M7742 Metatarsalgia, left foot: Secondary | ICD-10-CM | POA: Diagnosis not present

## 2016-08-01 DIAGNOSIS — M7740 Metatarsalgia, unspecified foot: Secondary | ICD-10-CM | POA: Insufficient documentation

## 2016-08-01 MED ORDER — CITRACAL PLUS PO TABS: 1.0000 | ORAL_TABLET | Freq: Two times a day (BID) | ORAL | 11 refills | Status: DC

## 2016-08-01 MED ORDER — MELOXICAM 15 MG PO TABS: ORAL_TABLET | ORAL | 3 refills | Status: DC

## 2016-08-01 NOTE — Progress Notes (Signed)
  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 Pain is localized over the plantar aspect of the second and third metatarsophalangeal joints. Moderate, persistent without radiation.  Past medical history:  Negative.  See flowsheet/record as well for more information.  Surgical history: Negative.  See flowsheet/record as well for more information.  Family history: Negative.  See flowsheet/record as well for more information.  Social history: Negative.  See flowsheet/record as well for more information.  Allergies, and medications have been entered into the medical record, reviewed, and no changes needed.   Review of Systems: No fevers, chills, night sweats, weight loss, chest pain, or shortness of breath.   Objective:    General: Well Developed, well nourished, and in no acute distress.  Neuro: Alert and oriented x3, extra-ocular muscles intact, sensation grossly intact.  HEENT: Normocephalic, atraumatic, pupils equal round reactive to light, neck supple, no masses, no lymphadenopathy, thyroid nonpalpable.  Skin: Warm and dry, no rashes. Cardiac: Regular rate and rhythm, no murmurs rubs or gallops, no lower extremity edema.  Respiratory: Clear to auscultation bilaterally. Not using accessory muscles, speaking in full sentences. Left foot: Foot inspection and palpation reveals breakdown of the transverse arch and a drop of MT heads. Abnormal callus is present between the second, third, and fourth metatarsal heads. Hammer toes are present. TTP under the metatarsal heads.  Impression and Recommendations:    Metatarsalgia Stress fracture has healed. Now with metatarsalgia, metatarsal pads placed, she  was given another pad to place in her orthotic, she will take meloxicam every day for 2 weeks, and avoid walking barefoot at home.

## 2016-08-01 NOTE — Assessment & Plan Note (Signed)
Stress fracture has healed. Now with metatarsalgia, metatarsal pads placed, she was given another pad to place in her orthotic, she will take meloxicam every day for 2 weeks, and avoid walking barefoot at home.

## 2016-08-19 DIAGNOSIS — M7541 Impingement syndrome of right shoulder: Secondary | ICD-10-CM | POA: Diagnosis not present

## 2016-08-19 DIAGNOSIS — M7581 Other shoulder lesions, right shoulder: Secondary | ICD-10-CM | POA: Diagnosis not present

## 2016-08-19 MED FILL — DICLOFENAC SODIUM 1% GEL: 1 | 30 days supply | Qty: 100 | Fill #0

## 2016-08-20 DIAGNOSIS — M7521 Bicipital tendinitis, right shoulder: Secondary | ICD-10-CM | POA: Diagnosis not present

## 2016-08-20 DIAGNOSIS — M7581 Other shoulder lesions, right shoulder: Secondary | ICD-10-CM | POA: Diagnosis not present

## 2016-08-26 ENCOUNTER — Encounter: Payer: Self-pay | Admitting: Sports Medicine

## 2016-08-26 ENCOUNTER — Ambulatory Visit (INDEPENDENT_AMBULATORY_CARE_PROVIDER_SITE_OTHER): Payer: 59 | Admitting: Sports Medicine

## 2016-08-26 DIAGNOSIS — M19031 Primary osteoarthritis, right wrist: Secondary | ICD-10-CM

## 2016-08-26 DIAGNOSIS — M67911 Unspecified disorder of synovium and tendon, right shoulder: Secondary | ICD-10-CM

## 2016-08-26 DIAGNOSIS — M7742 Metatarsalgia, left foot: Secondary | ICD-10-CM | POA: Diagnosis not present

## 2016-08-26 NOTE — Assessment & Plan Note (Signed)
Injection as above, continue meloxicam, return in one month.

## 2016-08-26 NOTE — Progress Notes (Signed)
Subjective:    I'm seeing this patient as a consultation for:  Dr. 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  For the past several weeks she's had pain in her right shoulder, localized over the deltoid and worse with overhead activities. She was given an unguided subacromial and bicipital sheath injection, unfortunately her pain has improved significantly.  Also has some pain that she localizes at the base of the right thumb, severe, persistent, no radiation. And has not been effective so far.  Past medical history:  Negative.  See flowsheet/record as well for more information.  Surgical history: Negative.  See flowsheet/record as well for more information.  Family history: Negative.  See flowsheet/record as well for more information.  Social history: Negative.  See flowsheet/record as well for more information.  Allergies, and medications have been entered into the medical record, reviewed, and no changes needed.   Review of Systems: No headache, visual changes, nausea, vomiting, diarrhea, constipation, dizziness, abdominal pain, skin rash, fevers, chills, night sweats, weight loss, swollen lymph nodes, body aches, joint swelling, muscle aches, chest pain, shortness of breath, mood changes, visual or auditory hallucinations.   Objective:   General: Well Developed, well nourished, and in no acute distress.  Neuro/Psych: Alert and oriented x3, extra-ocular muscles intact, able to move all 4 extremities, sensation grossly intact. Skin: Warm and dry, no rashes noted.  Respiratory: Not using accessory muscles, speaking in full sentences, trachea midline.  Cardiovascular: Pulses palpable, no extremity edema. Abdomen: Does not appear distended. Right Shoulder: Inspection reveals no abnormalities,  atrophy or asymmetry. Palpation is normal with no tenderness over AC joint or bicipital groove. ROM is full in all planes. Rotator cuff strength weak abduction No signs of impingement with negative Neer and Hawkin's tests, empty can. Speeds and Yergason's tests normal. No labral pathology noted with negative Obrien's, negative crank, negative clunk, and good stability. Normal scapular function observed. No painful arc and no drop arm sign. No apprehension sign Right Wrist: Tenderness, with fullness and a squared off appearance of the first carpometacarpal joint ROM smooth and normal with good flexion and extension and ulnar/radial deviation that is symmetrical with opposite wrist. Palpation is normal over metacarpals, navicular, lunate, and TFCC; tendons without tenderness/ swelling No snuffbox tenderness. No tenderness over Canal of Guyon. Strength 5/5 in all directions without pain. Negative Finkelstein, tinel's and phalens. Negative Watson's test.  Procedure: Real-time Ultrasound Guided Injection of right trapeziometacarpal joint Device: GE Logiq E  Verbal informed consent obtained.  Time-out conducted.  Noted no overlying erythema, induration, or other signs of local infection.  Skin prepped in a sterile fashion.  Local anesthesia: Topical Ethyl chloride.  With sterile technique and under real time ultrasound guidance:  Using a 30-gauge needle advanced into the joint, and injected 1/2 mL kenalog 40, 1/2 mL lidocaine. Completed without difficulty  Pain immediately resolved suggesting accurate placement of the medication.  Advised to call if fevers/chills, erythema, induration, drainage, or persistent bleeding.  Images permanently stored and available for review in the ultrasound unit.  Impression: Technically successful ultrasound guided injection.  Impression and Recommendations:   This case required medical decision making of moderate complexity.  Rotator cuff dysfunction,  right Has already had what sounds to be subacromial and bicipital injections at [facility] urgent care. Pain is improved, we  are going to proceed with aggressive formal physical therapy.  Localized primary carpometacarpal osteoarthritis, right Injection as above, continue meloxicam, return in one month.  Metatarsalgia Stress fracture is pain-free, metatarsalgia has improved with metatarsal pads. Return as needed for this.

## 2016-08-26 NOTE — Assessment & Plan Note (Signed)
Has already had what sounds to be subacromial and bicipital injections at [facility] urgent care. Pain is improved, we are going to proceed with aggressive formal physical therapy.

## 2016-08-26 NOTE — Assessment & Plan Note (Signed)
Stress fracture is pain-free, metatarsalgia has improved with metatarsal pads. Return as needed for this.

## 2016-08-29 ENCOUNTER — Ambulatory Visit: Payer: 59 | Admitting: Sports Medicine

## 2016-08-30 ENCOUNTER — Ambulatory Visit: Payer: 59 | Attending: Sports Medicine | Admitting: Physical Therapy

## 2016-08-30 DIAGNOSIS — M25511 Pain in right shoulder: Secondary | ICD-10-CM | POA: Diagnosis present

## 2016-08-30 DIAGNOSIS — M25512 Pain in left shoulder: Secondary | ICD-10-CM | POA: Diagnosis present

## 2016-08-30 DIAGNOSIS — M6281 Muscle weakness (generalized): Secondary | ICD-10-CM

## 2016-08-30 NOTE — Therapy (Signed)
[facility] [address] [place], [place], [address] Phone: [phone]   Fax:  [phone]  Physical Therapy Evaluation  Patient Details  Name: HIEDI TALLUTO MRN: [id number] Date of Birth: 05-25-1961 Referring Provider: Aundria Mems MD  Encounter Date: 08/30/2016      PT End of Session - 08/30/16 1332    Visit Number 1   Number of Visits 12   Date for PT Re-Evaluation 10/30/16   PT Start Time 1230   PT Stop Time 1320   PT Time Calculation (min) 50 min   Activity Tolerance Patient tolerated treatment well   Behavior During Therapy [facility] for tasks assessed/performed      Past Medical History:  Diagnosis Date  . Allergic rhinitis   . Chest pain radiating to jaw   . Esophageal reflux   . Incomplete right bundle branch block   . Situational anxiety     Past Surgical History:  Procedure Laterality Date  . Breast mass removal      There were no vitals filed for this visit.       Subjective Assessment - 08/30/16 1235    Subjective Pt was seen on 08/19/16 by her PCP for R shoulder pain who recommended ice and NSAIDS. On 08/20/16 pt ended up in Urgent Care and reported she couldn't lift her R UE. Her physician recommended Physical therapy.    Limitations House hold activities;Lifting   How long can you sit comfortably? 15 minutes   How long can you stand comfortably? 10 minutes   How long can you walk comfortably? only limited by stress fx in left metatarsal   Patient Stated Goals return to PLOF, be able to work pain free.    Currently in Pain? Yes   Pain Score 5    Pain Location Shoulder   Pain Orientation Right   Pain Descriptors / Indicators Aching   Pain Onset 1 to 4 weeks ago   Pain Frequency Intermittent   Aggravating Factors  working, reaching household activities   Pain Relieving Factors resting, NSAIDS   Effect of Pain on Daily Activities difficult to work. Pt is a floor PRN nurse at  [employer]   Multiple Pain Sites Yes   Pain Score 5   Pain Location Shoulder   Pain Orientation Left   Pain Descriptors / Indicators Aching   Pain Type Acute pain   Pain Frequency Constant   Aggravating Factors  working,    Pain Relieving Factors resting, ice, NSAIDS   Effect of Pain on Daily Activities difficutly working            [facility] PT Assessment - 08/30/16 0001      Assessment   Medical Diagnosis right and left shoulder pain   Referring Provider Aundria Mems MD   Onset Date/Surgical Date 08/15/16   Hand Dominance Left   Prior Therapy none     Restrictions   Weight Bearing Restrictions No     Balance Screen   Has the patient fallen in the past 6 months No   Is the patient reluctant to leave their home because of a fear of falling?  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    Baseline issued 08/30/16   Time 3   Period Weeks   Status New     PT  SHORT TERM GOAL #2   Title Pt will report pain of less than or equal to  3/10 with ADL's in bilateral UE's.    Time 3   Status New           PT Long Term Goals - 08/30/16 1347      PT LONG TERM GOAL #1   Title Pt will improve her FOTO from 41% limitation to >/= 30 % limitaion.    Time 6   Status New     PT LONG TERM GOAL #2   Title Pt will be able to perform overhead lifting to a shelf of an 5 pound object without difficulty.    Baseline has difficulty and reports increased pain   Time 6   Period Weeks   Status New     PT LONG TERM GOAL #3   Title Pt will improve her R external rotation to 80 degrees.    Baseline currently 65 degrees.    Time 6   Period Weeks   Status New               Plan - 08/30/16 1333    Clinical Impression Statement Pt presents with R shoulder pain and left shoulder pain. Pt with dx of R shoulder Rotator cuff inflammation.  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 Pt reported pain began on the right and has progress to the left side. This episode began the beginning of October. Pt was seen at Urgent Care secondary to not being able to lieft her R UE. Pt reporting pain eased after an injection at the Urgent Care on 08/20/16 in her R shoulder. Pt presenting with decreased Active ROM in her R external rotation and R internal rotation.  Pt is a low complexity evaluation who could benefit from skilled physicla therapy to progrss ROM deficits and help decrease pain. Also plans ro further  edu in postural correction with work activities.    Rehab Potential Excellent   PT Frequency 2x / week   PT Duration 6 weeks   PT Treatment/Interventions Therapeutic activities;Therapeutic exercise;Iontophoresis 4mg /ml Dexamethasone;Electrical Stimulation;Cryotherapy;Vasopneumatic Device;Passive range of motion;Manual techniques;Dry needling;Taping;Functional mobility training;ADLs/Self Care Home  Management;Moist Heat   PT Next Visit Plan manual therapy, ROM, strengthening, IFC, or Iontophoresis if agreeded upon by pt's physician.   PT Home Exercise Plan  cervical retraction, wall angles, IR/ER   Consulted and Agree with Plan of Care Patient      Patient will benefit from skilled therapeutic intervention in order to improve the following deficits and impairments:  Impaired perceived functional ability, Decreased range of motion, Impaired UE functional use, Decreased activity tolerance, Decreased strength, Postural dysfunction, Improper body mechanics, Decreased mobility  Visit Diagnosis: Acute pain of right shoulder  Acute pain of left shoulder  Muscle weakness (generalized)     Problem List Patient Active Problem List   Diagnosis Date Noted  . Localized primary carpometacarpal osteoarthritis, right 08/26/2016  . Rotator cuff dysfunction, right 08/26/2016  . Metatarsalgia 08/01/2016  . Metatarsal stress fracture of left foot 04/19/2016  . Abnormal EKG 11/06/2014  . Chest pain 06/10/2014  . Plantar fasciitis, bilateral 11/25/2013    Oretha Caprice, MPT  08/30/2016, 1:55 PM  [facility] [address] [place], [place], [address] Phone: [phone]   Fax:  [phone]  Name: BRANIAH KAPLOWITZ MRN: [id number] Date of Birth: November 06, 1960

## 2016-08-30 NOTE — Patient Instructions (Addendum)
       CERVICAL CHIN TUCK  AND RETRACTION - SUPINE WITH TOWEL  While lying on your back with a small folded up towel under your head, tuck your chin towards your chest. Also, focus on putting pressure on the towel with the back of your head.   Maintain contact of head with the towel the entire time.   Hold 3-5 seconds   .    Keeping your buttock, head and shoulder against the wall, slide your elbows up and down the wall.      AAROM Shoulder Internal Rotation  Holding on to a belt or towel with your affected arm behind your back, pull the opposite end up as pictured moving your arm into more internal rotation behind your back. Do this exercise in a relatively pain-free range of motion.        Perform each exercise 10 times, twice daily

## 2016-08-31 ENCOUNTER — Telehealth: Payer: Self-pay

## 2016-08-31 NOTE — Telephone Encounter (Signed)
Pt called triage regarding letter. Advised this would be addressed tomorrow when Provider is back in office. Wants to make sure it has restrictions regarding pushing/pulling. Pt would like to pick up letter.

## 2016-08-31 NOTE — Telephone Encounter (Signed)
Pt left VM asking for a work restrictions letter. Please assist.

## 2016-09-01 ENCOUNTER — Encounter: Payer: Self-pay | Admitting: Sports Medicine

## 2016-09-01 NOTE — Telephone Encounter (Signed)
Pt advised, will pick up letter today.

## 2016-09-01 NOTE — Telephone Encounter (Signed)
Letter in box. 

## 2016-09-06 DIAGNOSIS — M542 Cervicalgia: Secondary | ICD-10-CM | POA: Diagnosis not present

## 2016-09-06 DIAGNOSIS — M25512 Pain in left shoulder: Secondary | ICD-10-CM | POA: Diagnosis not present

## 2016-09-06 DIAGNOSIS — M25511 Pain in right shoulder: Secondary | ICD-10-CM | POA: Diagnosis not present

## 2016-09-06 DIAGNOSIS — R0789 Other chest pain: Secondary | ICD-10-CM | POA: Diagnosis not present

## 2016-09-06 DIAGNOSIS — K219 Gastro-esophageal reflux disease without esophagitis: Secondary | ICD-10-CM | POA: Diagnosis not present

## 2016-09-07 ENCOUNTER — Ambulatory Visit
Admission: RE | Admit: 2016-09-07 | Discharge: 2016-09-07 | Disposition: A | Discharge status: Home / Self Care | Payer: 59 | Source: Ambulatory Visit | Attending: Family Medicine | Admitting: Family Medicine

## 2016-09-07 ENCOUNTER — Ambulatory Visit: Payer: 59 | Attending: Sports Medicine

## 2016-09-07 ENCOUNTER — Other Ambulatory Visit: Payer: Self-pay | Admitting: Family Medicine

## 2016-09-07 DIAGNOSIS — M542 Cervicalgia: Secondary | ICD-10-CM

## 2016-09-07 DIAGNOSIS — M25511 Pain in right shoulder: Secondary | ICD-10-CM | POA: Diagnosis present

## 2016-09-07 DIAGNOSIS — M50321 Other cervical disc degeneration at C4-C5 level: Secondary | ICD-10-CM | POA: Diagnosis not present

## 2016-09-07 DIAGNOSIS — R252 Cramp and spasm: Secondary | ICD-10-CM | POA: Diagnosis present

## 2016-09-07 DIAGNOSIS — M25512 Pain in left shoulder: Secondary | ICD-10-CM | POA: Insufficient documentation

## 2016-09-07 DIAGNOSIS — R52 Pain, unspecified: Secondary | ICD-10-CM

## 2016-09-07 DIAGNOSIS — M6281 Muscle weakness (generalized): Secondary | ICD-10-CM | POA: Insufficient documentation

## 2016-09-07 NOTE — Therapy (Signed)
[facility] [address], [place], [place], [address] Phone: [phone]   Fax:  [phone]  Physical Therapy Treatment  Patient Details  Name: Gina Mitchell MRN: [id number] Date of Birth: 08-May-1961 Referring Provider: Aundria Mems MD  Encounter Date: 09/07/2016      PT End of Session - 09/07/16 1521    Visit Number 2   Date for PT Re-Evaluation 10/30/16   PT Start Time 1446   PT Stop Time 1527   PT Time Calculation (min) 41 min   Activity Tolerance Patient tolerated treatment well   Behavior During Therapy [facility] for tasks assessed/performed      Past Medical History:  Diagnosis Date  . Allergic rhinitis   . Chest pain radiating to jaw   . Esophageal reflux   . Incomplete right bundle branch block   . Situational anxiety     Past Surgical History:  Procedure Laterality Date  . Breast mass removal      There were no vitals filed for this visit.      Subjective Assessment - 09/07/16 1446    Subjective Pt had x-ray of neck and shoulders today due to increased pain yesterday.   Currently in Pain? Yes   Pain Score 3    Pain Location Shoulder   Pain Orientation Right;Left   Pain Descriptors / Indicators Aching   Pain Type Chronic pain   Pain Onset 1 to 4 weeks ago   Pain Frequency Intermittent   Aggravating Factors  working, reaching, household activities   Pain Relieving Factors resting, medication                         [facility] Adult PT Treatment/Exercise - 09/07/16 0001      Exercises   Exercises Shoulder;Neck     Neck Exercises: Machines for Strengthening   UBE (Upper Arm Bike) Level 0 x 6 minutes (3/3)     Neck Exercises: Theraband   Rows 20 reps;Other (comment)  yellow theraband   Horizontal ABduction --     Neck Exercises: Supine   Neck Retraction 20 reps;5 secs     Shoulder Exercises: Supine   Horizontal ABduction Strengthening;Both;20 reps;Theraband   Theraband Level (Shoulder Horizontal ABduction) Level 1 (Yellow)   Horizontal ABduction Limitations on foam roll   External Rotation Strengthening;Both;20 reps;Theraband   Theraband Level (Shoulder External Rotation) Level 1 (Yellow)   External Rotation Limitations on foam roll   Flexion Strengthening;Both;20 reps;Theraband   Theraband Level (Shoulder Flexion) Level 1 (Yellow)   Flexion Limitations on foam roll     Shoulder Exercises: Standing   Other Standing Exercises snow angels on wall x 10     Shoulder Exercises: Pulleys   Flexion 3 minutes                  PT Short Term Goals - 09/07/16 1449      PT SHORT TERM GOAL #1   Title pt will be independent with her HEP.    Time 3   Period Weeks   Status On-going     PT SHORT TERM GOAL #2   Title Pt will report pain of less than or equal to  3/10 with ADL's in bilateral UE's.    Time 3   Period Weeks   Status On-going           PT Long Term Goals - 08/30/16 1347      PT LONG TERM  GOAL #1   Title Pt will improve her FOTO from 41% limitation to >/= 30 % limitaion.    Time 6   Status New     PT LONG TERM GOAL #2   Title Pt will be able to perform overhead lifting to a shelf of an 5 pound object without difficulty.    Baseline has difficulty and reports increased pain   Time 6   Period Weeks   Status New     PT LONG TERM GOAL #3   Title Pt will improve her R external rotation to 80 degrees.    Baseline currently 65 degrees.    Time 6   Period Weeks   Status New               Plan - 09/07/16 1449    Clinical Impression Statement Pt with only 1 session after evaluation.  Pt had x-ray yesterday due to increasesed pain in the shoulders.  Pt is independent and compliant in HEP for postural strength and flexibility.  Pt will continue to benefit from skilled PT for postural strength, endurance, manual and modalities as needed to reduce pain.     Rehab Potential Excellent   PT Frequency 2x / week   PT  Duration 6 weeks   PT Treatment/Interventions Therapeutic activities;Therapeutic exercise;Iontophoresis 4mg /ml Dexamethasone;Electrical Stimulation;Cryotherapy;Vasopneumatic Device;Passive range of motion;Manual techniques;Dry needling;Taping;Functional mobility training;ADLs/Self Care Home Management;Moist Heat   PT Next Visit Plan manual therapy, ROM, strengthening, or Iontophoresis to Lt shoulder (pt has already had injection into Rt shoulder recently), postural strength-add theraband to HEP   Consulted and Agree with Plan of Care Patient      Patient will benefit from skilled therapeutic intervention in order to improve the following deficits and impairments:  Impaired perceived functional ability, Decreased range of motion, Impaired UE functional use, Decreased activity tolerance, Decreased strength, Postural dysfunction, Improper body mechanics, Decreased mobility  Visit Diagnosis: Acute pain of right shoulder  Acute pain of left shoulder  Muscle weakness (generalized)     Problem List Patient Active Problem List   Diagnosis Date Noted  . Localized primary carpometacarpal osteoarthritis, right 08/26/2016  . Rotator cuff dysfunction, right 08/26/2016  . Metatarsalgia 08/01/2016  . Metatarsal stress fracture of left foot 04/19/2016  . Abnormal EKG 11/06/2014  . Chest pain 06/10/2014  . Plantar fasciitis, bilateral 11/25/2013     Sigurd Sos, PT 09/07/16 3:22 PM  [facility] [address], [place], [place], [address] Phone: [phone]   Fax:  [phone]  Name: Gina Mitchell MRN: [id number] Date of Birth: 1961/08/12

## 2016-09-08 ENCOUNTER — Encounter: Payer: 59 | Admitting: Physical Therapy

## 2016-09-09 MED FILL — PANTOPRAZOLE SOD DR 40 MG T: 40 | 90 days supply | Qty: 90 | Fill #0

## 2016-09-13 ENCOUNTER — Ambulatory Visit (INDEPENDENT_AMBULATORY_CARE_PROVIDER_SITE_OTHER): Payer: 59 | Admitting: Sports Medicine

## 2016-09-13 ENCOUNTER — Encounter: Payer: Self-pay | Admitting: Sports Medicine

## 2016-09-13 DIAGNOSIS — M5030 Other cervical disc degeneration, unspecified cervical region: Secondary | ICD-10-CM | POA: Diagnosis not present

## 2016-09-13 DIAGNOSIS — M19031 Primary osteoarthritis, right wrist: Secondary | ICD-10-CM

## 2016-09-13 MED ORDER — MAGNESIUM OXIDE 400 MG PO TABS: 800.0000 mg | ORAL_TABLET | Freq: Every day | ORAL | 3 refills | Status: DC

## 2016-09-13 MED ORDER — ACETAMINOPHEN ER 650 MG PO TBCR: 650.0000 mg | EXTENDED_RELEASE_TABLET | Freq: Three times a day (TID) | ORAL | 3 refills | Status: DC | PRN

## 2016-09-13 MED FILL — MAGNESIUM OXIDE 400 MG TAB: 400 | 30 days supply | Qty: 60 | Fill #0

## 2016-09-13 NOTE — Assessment & Plan Note (Signed)
Overall doing well after injection.

## 2016-09-13 NOTE — Progress Notes (Signed)
   Subjective:    I'm seeing this patient as a consultation for:  Dr. Shirline Frees  CC: Follow-up multiple issues  HPI: Right trapeziometacarpal joint arthritis: Resolved after injection  Neck and shoulder pain: X-ray showed multilevel degenerative changes, has pain radiating around the left scapula. Oral NSAIDs are not effective at this point. Has not yet done physical therapy for the next specifically.  Past medical history:  Negative.  See flowsheet/record as well for more information.  Surgical history: Negative.  See flowsheet/record as well for more information.  Family history: Negative.  See flowsheet/record as well for more information.  Social history: Negative.  See flowsheet/record as well for more information.  Allergies, and medications have been entered into the medical record, reviewed, and no changes needed.   Review of Systems: No headache, visual changes, nausea, vomiting, diarrhea, constipation, dizziness, abdominal pain, skin rash, fevers, chills, night sweats, weight loss, swollen lymph nodes, body aches, joint swelling, muscle aches, chest pain, shortness of breath, mood changes, visual or auditory hallucinations.   Objective:   General: Well Developed, well nourished, and in no acute distress.  Neuro/Psych: Alert and oriented x3, extra-ocular muscles intact, able to move all 4 extremities, sensation grossly intact. Skin: Warm and dry, no rashes noted.  Respiratory: Not using accessory muscles, speaking in full sentences, trachea midline.  Cardiovascular: Pulses palpable, no extremity edema. Abdomen: Does not appear distended. Neck: Negative spurling's Full neck range of motion Grip strength and sensation normal in bilateral hands Strength good C4 to T1 distribution No sensory change to C4 to T1 Reflexes normal  X-ray show moderate multilevel degenerative disc disease with facet arthritis.  Impression and Recommendations:   This case required medical  decision making of moderate complexity.  DDD (degenerative disc disease), cervical Mild to level degenerative disc disease with little if no anterolisthesis. We simply need to add formal physical therapy.  And would also like her to take arthritis strength Tylenol. Adding magnesium oxide at bedtime. If insufficient improvement after one month we will order an MRI.  Localized primary carpometacarpal osteoarthritis, right Overall doing well after injection.

## 2016-09-13 NOTE — Assessment & Plan Note (Addendum)
Mild to level degenerative disc disease with little if no anterolisthesis. We simply need to add formal physical therapy.  And would also like her to take arthritis strength Tylenol. Adding magnesium oxide at bedtime. If insufficient improvement after one month we will order an MRI.

## 2016-09-15 ENCOUNTER — Ambulatory Visit: Payer: 59 | Admitting: Physical Therapy

## 2016-09-15 ENCOUNTER — Encounter: Payer: Self-pay | Admitting: Physical Therapy

## 2016-09-15 DIAGNOSIS — M25512 Pain in left shoulder: Secondary | ICD-10-CM | POA: Diagnosis not present

## 2016-09-15 DIAGNOSIS — M25511 Pain in right shoulder: Secondary | ICD-10-CM

## 2016-09-15 DIAGNOSIS — M542 Cervicalgia: Secondary | ICD-10-CM | POA: Diagnosis not present

## 2016-09-15 DIAGNOSIS — R252 Cramp and spasm: Secondary | ICD-10-CM | POA: Diagnosis not present

## 2016-09-15 DIAGNOSIS — M6281 Muscle weakness (generalized): Secondary | ICD-10-CM

## 2016-09-15 NOTE — Therapy (Signed)
[facility] [address], [place], [place], [address] Phone: [phone]   Fax:  [phone]  Physical Therapy Treatment  Patient Details  Name: Gina Mitchell MRN: [id number] Date of Birth: 23-Oct-1961 Referring Provider: Aundria Mems MD  Encounter Date: 09/15/2016      PT End of Session - 09/15/16 1245    Visit Number 3   Number of Visits 12   Date for PT Re-Evaluation 10/30/16   PT Start Time 1238   PT Stop Time 1328   PT Time Calculation (min) 50 min   Activity Tolerance Patient tolerated treatment well   Behavior During Therapy [facility] for tasks assessed/performed      Past Medical History:  Diagnosis Date  . Allergic rhinitis   . Chest pain radiating to jaw   . Esophageal reflux   . Incomplete right bundle branch block   . Situational anxiety     Past Surgical History:  Procedure Laterality Date  . Breast mass removal      There were no vitals filed for this visit.      Subjective Assessment - 09/15/16 1242    Subjective Pt Xray showed DDD and mild stenosis in C5-C7 per pt. Pt will be being reevaluated for this by a PT at next treatment.    Limitations House hold activities;Lifting   How long can you sit comfortably? 15 minutes   How long can you stand comfortably? 10 minutes   How long can you walk comfortably? only limited by stress fx in left metatarsal   Patient Stated Goals return to PLOF, be able to work pain free.    Currently in Pain? Yes   Pain Score 3    Pain Location Shoulder   Pain Orientation Right;Left   Pain Descriptors / Indicators Aching   Pain Type Chronic pain   Pain Onset 1 to 4 weeks ago   Pain Frequency Intermittent   Aggravating Factors  Working, reaching, household activities   Pain Relieving Factors resting medication   Effect of Pain on Daily Activities difficult to work.    Multiple Pain Sites No                         [facility] Adult PT  Treatment/Exercise - 09/15/16 0001      Neck Exercises: Machines for Strengthening   UBE (Upper Arm Bike) Level 0 x 6 minutes (3/3)     Shoulder Exercises: Standing   External Rotation Strengthening;Both;20 reps;Theraband   Theraband Level (Shoulder External Rotation) Level 2 (Red)   Internal Rotation Strengthening;Both;20 reps;Theraband   Theraband Level (Shoulder Internal Rotation) Level 3 (Green)   Flexion Strengthening;Right;10 reps  #2   ABduction Strengthening;Both;10 reps  #2   Extension Strengthening;Both;20 reps;Theraband   Theraband Level (Shoulder Extension) Level 2 (Red)   Row Strengthening;Both;20 reps   Theraband Level (Shoulder Row) Level 2 (Red)   Other Standing Exercises snow angels on wall x 10   Other Standing Exercises Scaption x10  #2     Shoulder Exercises: Pulleys   Flexion 3 minutes     Modalities   Modalities Electrical Stimulation;Cryotherapy     Cryotherapy   Number Minutes Cryotherapy 15 Minutes   Cryotherapy Location Shoulder   Type of Cryotherapy Ice pack     Electrical Stimulation   Electrical Stimulation Location Rt shoulder   Electrical Stimulation Action IFC   Electrical Stimulation Parameters to tolerance   Electrical Stimulation Goals Pain  PT Short Term Goals - 09/07/16 1449      PT SHORT TERM GOAL #1   Title pt will be independent with her HEP.    Time 3   Period Weeks   Status On-going     PT SHORT TERM GOAL #2   Title Pt will report pain of less than or equal to  3/10 with ADL's in bilateral UE's.    Time 3   Period Weeks   Status On-going           PT Long Term Goals - 08/30/16 1347      PT LONG TERM GOAL #1   Title Pt will improve her FOTO from 41% limitation to >/= 30 % limitaion.    Time 6   Status New     PT LONG TERM GOAL #2   Title Pt will be able to perform overhead lifting to a shelf of an 5 pound object without difficulty.    Baseline has difficulty and reports increased  pain   Time 6   Period Weeks   Status New     PT LONG TERM GOAL #3   Title Pt will improve her R external rotation to 80 degrees.    Baseline currently 65 degrees.    Time 6   Period Weeks   Status New               Plan - 09/15/16 1318    Clinical Impression Statement Pt has been practicing sing better posture and has been compliant with home exercises which pt reports she believes is helping. Pt able to tolerate all strengthening exercises well with no compensations. Pt will continue to benefit from skilled therapy for shoulder strength and stabliity.    Rehab Potential Excellent   PT Frequency 2x / week   PT Duration 6 weeks   PT Treatment/Interventions Therapeutic activities;Therapeutic exercise;Iontophoresis 4mg /ml Dexamethasone;Electrical Stimulation;Cryotherapy;Vasopneumatic Device;Passive range of motion;Manual techniques;Dry needling;Taping;Functional mobility training;ADLs/Self Care Home Management;Moist Heat   PT Next Visit Plan Shoulder strength and stability, ROM, modalities as needed   Consulted and Agree with Plan of Care Patient      Patient will benefit from skilled therapeutic intervention in order to improve the following deficits and impairments:  Impaired perceived functional ability, Decreased range of motion, Impaired UE functional use, Decreased activity tolerance, Decreased strength, Postural dysfunction, Improper body mechanics, Decreased mobility  Visit Diagnosis: Acute pain of right shoulder  Acute pain of left shoulder  Muscle weakness (generalized)     Problem List Patient Active Problem List   Diagnosis Date Noted  . DDD (degenerative disc disease), cervical 09/13/2016  . Localized primary carpometacarpal osteoarthritis, right 08/26/2016  . Rotator cuff dysfunction, right 08/26/2016  . Metatarsalgia 08/01/2016  . Metatarsal stress fracture of left foot 04/19/2016  . Abnormal EKG 11/06/2014  . Chest pain 06/10/2014  . Plantar  fasciitis, bilateral 11/25/2013    Mikle Bosworth PTA 09/15/2016, 1:26 PM  [facility] [address], [place], [place], [address] Phone: [phone]   Fax:  [phone]  Name: Gina Mitchell MRN: [id number] Date of Birth: 07/10/1961

## 2016-09-19 ENCOUNTER — Ambulatory Visit: Payer: 59

## 2016-09-19 DIAGNOSIS — M6281 Muscle weakness (generalized): Secondary | ICD-10-CM | POA: Diagnosis not present

## 2016-09-19 DIAGNOSIS — M25512 Pain in left shoulder: Secondary | ICD-10-CM | POA: Diagnosis not present

## 2016-09-19 DIAGNOSIS — M25511 Pain in right shoulder: Secondary | ICD-10-CM | POA: Diagnosis not present

## 2016-09-19 DIAGNOSIS — R252 Cramp and spasm: Secondary | ICD-10-CM

## 2016-09-19 DIAGNOSIS — M542 Cervicalgia: Secondary | ICD-10-CM

## 2016-09-19 NOTE — Patient Instructions (Addendum)
PERFORM ALL EXERCISES GENTLY AND WITH GOOD POSTURE.    20 SECOND HOLD, 3 REPS TO EACH SIDE. 4-5 TIMES EACH DAY.   AROM: Neck Rotation   Turn head slowly to look over one shoulder, then the other.   AROM: Neck Flexion   Bend head forward.   AROM: Lateral Neck Flexion   Slowly tilt head toward one shoulder, then the other.    Posture - Standing   Good posture is important. Avoid slouching and forward head thrust. Maintain curve in low back and align ears over shoulders, hips over ankles.  Pull your belly button in toward your back bone. Posture Tips DO: - stand tall and erect - keep chin tucked in - keep head and shoulders in alignment - check posture regularly in mirror or large window - pull head back against headrest in car seat;  Change your position often.  Sit with lumbar support. DON'T: - slouch or slump while watching TV or reading - sit, stand or lie in one position  for too long;  Sitting is especially hard on the spine so if you sit at a desk/use the computer, then stand up often! Copyright  [employer]. All rights reserved.  Posture - Sitting  Sit upright, head facing forward. Try using a roll to support lower back. Keep shoulders relaxed, and avoid rounded back. Keep hips level with knees. Avoid crossing legs for long periods. Copyright  [employer]. All rights reserved.  Chronic neck strain can develop because of poor posture and faulty work habits  Postural strain related to slumped sitting and forward head posture is a leading cause of headaches, neck and upper back pain  General strengthening and flexibility exercises are helpful in the treatment of neck pain.  Most importantly, you should learn to correct the posture that may be contributing to chronic pain.   Change positions frequently  Change your work or home environment to improve posture and mechanics.   [facility] [address], [address] [place], [place] [address] Phone # [phone] Fax  [phone] 

## 2016-09-19 NOTE — Therapy (Signed)
[facility] [address] [place], [place], [address] Phone: [phone]   Fax:  [phone]  Physical Therapy Evaluation  Patient Details  Name: Gina Mitchell MRN: [id number] Date of Birth: [birth year] Referring Provider: Aundria Mems MD  Encounter Date: 09/19/2016      PT End of Session - 09/19/16 1010    Visit Number 4   Date for PT Re-Evaluation 10/31/16   PT Start Time 0933   PT Stop Time 1020   PT Time Calculation (min) 47 min   Activity Tolerance Patient tolerated treatment well   Behavior During Therapy [facility] for tasks assessed/performed      Past Medical History:  Diagnosis Date  . Allergic rhinitis   . Chest pain radiating to jaw   . Esophageal reflux   . Incomplete right bundle branch block   . Situational anxiety     Past Surgical History:  Procedure Laterality Date  . Breast mass removal      There were no vitals filed for this visit.       Subjective Assessment - 09/19/16 0937    Subjective Pt Xray showed DDD and mild stenosis in C5-C7 per pt. Pt being evaluated for this today.  No incident or injury.   Currently in Pain? Yes   Pain Score 0-No pain   Pain Orientation Right;Left   Pain Descriptors / Indicators Aching   Pain Type Chronic pain   Pain Onset More than a month ago   Pain Frequency Intermittent   Aggravating Factors  work, reaching, household activities   Pain Relieving Factors Meloxicam, rest   Pain Score 3   Pain Location Neck   Pain Orientation Right;Left   Pain Descriptors / Indicators Sore   Pain Type Chronic pain   Pain Onset More than a month ago   Pain Frequency Intermittent   Aggravating Factors  morning when stiff, turning head   Pain Relieving Factors medication, after moving around            [facility] PT Assessment - 09/19/16 0001      Assessment   Medical Diagnosis right and left shoulder pain, cervical DDD   Onset Date/Surgical Date  08/15/16  neck pain 3 month history   Hand Dominance Left   Next MD Visit 09/2016     Restrictions   Weight Bearing Restrictions No     Balance Screen   Has the patient fallen in the past 6 months No   Has the patient had a decrease in activity level because of a fear of falling?  No   Is the patient reluctant to leave their home because of a fear of falling?  No     Home Environment   Living Environment Private residence   Living Arrangements Spouse/significant other;Children   Available Help at Discharge Family   Type of [place] to enter   Entrance Stairs-Number of Steps 5   Entrance Stairs-Rails Right;Left;Can reach both   [place] One level   [place] None     Prior Function   Level of Independence Independent   Vocation Full time employment  pt is a Air traffic controller at [employer], turning, walking    Leisure walking, working out     [employer] Status Within Functional Limits for tasks assessed     Observation/Other Assessments   Focus on Therapeutic Outcomes (FOTO)  31% limitation  neck     Posture/Postural Control   Posture/Postural Control Postural limitations   Postural Limitations Rounded Shoulders     ROM / Strength   AROM / PROM / Strength AROM;PROM;Strength     AROM   Overall AROM  Deficits   Overall AROM Comments Cervical AROM: flexion is full, extension limited by 25%, Lt sidenbending limited by 25% with Rt upper trap pain, rotation and Rt sidebending full with pain reported at end range of each cervical A/ROM.     AROM Assessment Site Shoulder;Cervical   Right/Left Shoulder Right;Left     Strength   Overall Strength Deficits   Overall Strength Comments 4 to 4+/5 bil. UE strength      Palpation   Palpation comment Pt with trigger points and palpable tenderness over Rt upper trap, cervical paraspinals and suboccipitals                   [facility] Adult PT  Treatment/Exercise - 09/19/16 0001      Modalities   Modalities Electrical Stimulation;Moist Heat     Moist Heat Therapy   Number Minutes Moist Heat 15 Minutes   Moist Heat Location Cervical     Electrical Stimulation   Electrical Stimulation Location neck   Electrical Stimulation Action IFC   Electrical Stimulation Parameters 15 minutes   Electrical Stimulation Goals Pain                PT Education - 09/19/16 0956    Education provided Yes   Education Details posture and HEP for neck flexibility   Person(s) Educated Patient   Methods Explanation;Demonstration;Handout   Comprehension Verbalized understanding;Returned demonstration          PT Short Term Goals - 09/07/16 1449      PT SHORT TERM GOAL #1   Title pt will be independent with her HEP.    Time 3   Period Weeks   Status On-going     PT SHORT TERM GOAL #2   Title Pt will report pain of less than or equal to  3/10 with ADL's in bilateral UE's.    Time 3   Period Weeks   Status On-going           PT Long Term Goals - 09/19/16 0940      PT LONG TERM GOAL #1   Title Pt will improve her FOTO (shoulder) from 41% limitation to >/= 30 % limitaion.    Time 6   Period Weeks   Status On-going     PT LONG TERM GOAL #2   Title Pt will be able to perform overhead lifting to a shelf of an 5 pound object without difficulty.    Period Weeks   Status On-going     PT LONG TERM GOAL #3   Title Pt will improve her Rt shoulder external rotation to 80 degrees.    Time 6   Period Weeks   Status On-going     PT LONG TERM GOAL #4   Title reduce FOTO (neck) to < or = to 26% limitation   Time 6   Period Weeks   Status New     PT LONG TERM GOAL #5   Title report a 60% reduction in neck pain with daily tasks   Time 6   Period Weeks   Status New     Additional Long Term Goals   Additional Long Term Goals Yes     PT LONG TERM GOAL #6  Title demonstrate full cervical A/ROM to the Lt to improve  driving   Time 6   Period Weeks   Status New               Plan - 09/19/16 [id number]    Clinical Impression Statement Pt here for evaluation for neck pain.  Recent x-ray showed cervical DDD.  Pt demonstrates limited cervical AROM to the Lt and Rt neck pain with all cervical AROM.  Pt demonstrates trigger points in the Rt>Lt upper trap and cervical paraspinals/suboccipitals.  Pt reports pain as 3/10 especially in the morning and with turning head with driving.  Pt will continue to benefit from skilled PT for shoulder AROM and strength in addition to modalites for neck and shoulders as needed.  Pt will beneift from PT for neck to include dry needling, cervcial mobs and traction as needed.  Pt is a low complexity evaluation due to lack of comorbidities, stable condition and 1 body part assessed.     Rehab Potential Good   PT Frequency 2x / week   PT Duration 6 weeks   PT Treatment/Interventions Therapeutic activities;Therapeutic exercise;Iontophoresis 4mg /ml Dexamethasone;Electrical Stimulation;Cryotherapy;Vasopneumatic Device;Passive range of motion;Manual techniques;Dry needling;Taping;Functional mobility training;ADLs/Self Care Home Management;Moist Heat;Traction;Ultrasound;Neuromuscular re-education;Patient/family education   PT Next Visit Plan Dry needling to neck and upper traps, cervical flexibility, manual/modalities for pain/trigger points, resume treatment for shoulders   Consulted and Agree with Plan of Care Patient      Patient will benefit from skilled therapeutic intervention in order to improve the following deficits and impairments:  Impaired perceived functional ability, Decreased range of motion, Impaired UE functional use, Decreased activity tolerance, Decreased strength, Postural dysfunction, Improper body mechanics, Decreased mobility, Increased muscle spasms  Visit Diagnosis: Acute pain of right shoulder - Plan: PT plan of care cert/re-cert  Acute pain of left shoulder -  Plan: PT plan of care cert/re-cert  Muscle weakness (generalized) - Plan: PT plan of care cert/re-cert  Cervicalgia - Plan: PT plan of care cert/re-cert  Cramp and spasm - Plan: PT plan of care cert/re-cert     Problem List Patient Active Problem List   Diagnosis Date Noted  . DDD (degenerative disc disease), cervical 09/13/2016  . Localized primary carpometacarpal osteoarthritis, right 08/26/2016  . Rotator cuff dysfunction, right 08/26/2016  . Metatarsalgia 08/01/2016  . Metatarsal stress fracture of left foot 04/19/2016  . Abnormal EKG 11/06/2014  . Chest pain 06/10/2014  . Plantar fasciitis, bilateral 11/25/2013     Sigurd Sos, PT 09/19/16 10:13 AM  [facility] [address] [place], [place], [address] Phone: [phone]   Fax:  [phone]  Name: Gina Mitchell MRN: [id number] Date of Birth: [birth year]

## 2016-09-26 ENCOUNTER — Ambulatory Visit: Payer: 59

## 2016-09-26 ENCOUNTER — Ambulatory Visit: Payer: 59 | Admitting: Sports Medicine

## 2016-09-26 DIAGNOSIS — M25512 Pain in left shoulder: Secondary | ICD-10-CM

## 2016-09-26 DIAGNOSIS — M6281 Muscle weakness (generalized): Secondary | ICD-10-CM | POA: Diagnosis not present

## 2016-09-26 DIAGNOSIS — M542 Cervicalgia: Secondary | ICD-10-CM

## 2016-09-26 DIAGNOSIS — R252 Cramp and spasm: Secondary | ICD-10-CM | POA: Diagnosis not present

## 2016-09-26 DIAGNOSIS — M25511 Pain in right shoulder: Secondary | ICD-10-CM

## 2016-09-26 NOTE — Therapy (Signed)
[facility] [address], [place], [place], [address] Phone: [phone]   Fax:  [phone]  Physical Therapy Treatment  Patient Details  Name: Gina Mitchell MRN: [id number] Date of Birth: Nov 18, 1960 Referring Provider: Aundria Mems MD  Encounter Date: 09/26/2016      PT End of Session - 09/26/16 1309    Visit Number 5   Date for PT Re-Evaluation 10/31/16   PT Start Time 1236  late   PT Stop Time 1325   PT Time Calculation (min) 49 min   Activity Tolerance Patient tolerated treatment well   Behavior During Therapy [facility] for tasks assessed/performed      Past Medical History:  Diagnosis Date  . Allergic rhinitis   . Chest pain radiating to jaw   . Esophageal reflux   . Incomplete right bundle branch block   . Situational anxiety     Past Surgical History:  Procedure Laterality Date  . Breast mass removal      There were no vitals filed for this visit.      Subjective Assessment - 09/26/16 1243    Subjective Bil shoulders hurt today.  Pt has been trying to do neck stretches.     Currently in Pain? Yes   Pain Score 5    Pain Location Shoulder   Pain Orientation Right;Left   Pain Descriptors / Indicators Aching   Pain Type Acute pain   Pain Onset More than a month ago   Pain Frequency Intermittent   Aggravating Factors  work, reaching, household activity   Pain Relieving Factors Medication, rest   Pain Score 3   Pain Location Neck   Pain Orientation Right;Left   Pain Descriptors / Indicators Sore   Pain Type Chronic pain   Pain Onset More than a month ago   Pain Frequency Intermittent   Aggravating Factors  morning when stiff, turning head   Pain Relieving Factors medication, after moving                         [facility] Adult PT Treatment/Exercise - 09/26/16 0001      Neck Exercises: Machines for Strengthening   UBE (Upper Arm Bike) Level 1 x 6 minutes (3/3)      Shoulder Exercises: Standing   Extension Strengthening;Both;20 reps;Theraband   Theraband Level (Shoulder Extension) Level 2 (Red)   Row Strengthening;Both;20 reps   Theraband Level (Shoulder Row) Level 2 (Red)     Shoulder Exercises: Pulleys   Flexion 3 minutes     Moist Heat Therapy   Number Minutes Moist Heat 15 Minutes   Moist Heat Location Cervical     Electrical Stimulation   Electrical Stimulation Location neck   Electrical Stimulation Action IFC   Electrical Stimulation Parameters 15 minutes   Electrical Stimulation Goals Pain     Neck Exercises: Stretches   Upper Trapezius Stretch 3 reps;20 seconds   Other Neck Stretches cervical flexion and rotation 3x20 seconds each                  PT Short Term Goals - 09/26/16 1239      PT SHORT TERM GOAL #1   Title pt will be independent with her HEP.    Status Achieved     PT SHORT TERM GOAL #2   Title Pt will report pain of less than or equal to  3/10 with ADL's in bilateral UE's.    Time 3  Period Weeks   Status On-going           PT Long Term Goals - 09/26/16 1240      PT LONG TERM GOAL #1   Title Pt will improve her FOTO (shoulder) from 41% limitation to >/= 30 % limitaion.    Time 6   Period Weeks   Status On-going     PT LONG TERM GOAL #2   Title Pt will be able to perform overhead lifting to a shelf of an 5 pound object without difficulty.    Time 6   Period Weeks   Status On-going     PT LONG TERM GOAL #4   Title reduce FOTO (neck) to < or = to 26% limitation   Time 6   Period Weeks   Status On-going     PT LONG TERM GOAL #5   Title report a 60% reduction in neck pain with daily tasks   Time 6   Period Weeks   Status On-going     PT LONG TERM GOAL #6   Title demonstrate full cervical A/ROM to the Lt to improve driving   Time 6   Period Weeks               Plan - 09/26/16 1245    Clinical Impression Statement Pt is not sure if she would like to do dry needling for her  neck.  Pt with more bil. shoulder pain than neck pain today.  Pt with trigger points in Rt>Lt neck and will consider dry needling in future appointments as pt was provided with handouts.  Pt will continue to benefit from skilled PT for manual, postural strength, modalities, and flexiblity.   Rehab Potential Good   PT Frequency 2x / week   PT Duration 6 weeks   PT Treatment/Interventions Therapeutic activities;Therapeutic exercise;Iontophoresis 4mg /ml Dexamethasone;Electrical Stimulation;Cryotherapy;Vasopneumatic Device;Passive range of motion;Manual techniques;Dry needling;Taping;Functional mobility training;ADLs/Self Care Home Management;Moist Heat;Traction;Ultrasound;Neuromuscular re-education;Patient/family education   PT Next Visit Plan Dry needling to neck and upper traps if pt wants to do it, cervical flexibility, manual/modalities for pain/trigger points, resume treatment for shoulders   Consulted and Agree with Plan of Care Patient      Patient will benefit from skilled therapeutic intervention in order to improve the following deficits and impairments:  Impaired perceived functional ability, Decreased range of motion, Impaired UE functional use, Decreased activity tolerance, Decreased strength, Postural dysfunction, Improper body mechanics, Decreased mobility, Increased muscle spasms  Visit Diagnosis: Acute pain of right shoulder  Acute pain of left shoulder  Muscle weakness (generalized)  Cervicalgia  Cramp and spasm     Problem List Patient Active Problem List   Diagnosis Date Noted  . DDD (degenerative disc disease), cervical 09/13/2016  . Localized primary carpometacarpal osteoarthritis, right 08/26/2016  . Rotator cuff dysfunction, right 08/26/2016  . Metatarsalgia 08/01/2016  . Metatarsal stress fracture of left foot 04/19/2016  . Abnormal EKG 11/06/2014  . Chest pain 06/10/2014  . Plantar fasciitis, bilateral 11/25/2013     Sigurd Sos, PT 09/26/16 1:11  PM  [facility] [address], [place], [place], [address] Phone: [phone]   Fax:  [phone]  Name: Gina Mitchell MRN: [id number] Date of Birth: 11-30-1960

## 2016-10-06 ENCOUNTER — Ambulatory Visit: Payer: 59 | Attending: Sports Medicine | Admitting: Physical Therapy

## 2016-10-06 DIAGNOSIS — R252 Cramp and spasm: Secondary | ICD-10-CM | POA: Diagnosis present

## 2016-10-06 DIAGNOSIS — M542 Cervicalgia: Secondary | ICD-10-CM | POA: Insufficient documentation

## 2016-10-06 NOTE — Therapy (Signed)
[facility] [address] [place], [place], [address] Phone: [phone]   Fax:  [phone]  Physical Therapy Treatment  Patient Details  Name: Gina Mitchell MRN: [id number] Date of Birth: [birth year] Referring Provider: Aundria Mems MD  Encounter Date: 10/06/2016      PT End of Session - 10/06/16 1143    Visit Number 6   Number of Visits 12   Date for PT Re-Evaluation 10/31/16   PT Start Time 1106   PT Stop Time 1155   PT Time Calculation (min) 49 min   Activity Tolerance Patient tolerated treatment well      Past Medical History:  Diagnosis Date  . Allergic rhinitis   . Chest pain radiating to jaw   . Esophageal reflux   . Incomplete right bundle branch block   . Situational anxiety     Past Surgical History:  Procedure Laterality Date  . Breast mass removal      There were no vitals filed for this visit.      Subjective Assessment - 10/06/16 1108    Subjective I think PT is helping.  I"ve been doing my exercises but it's been busy this week.  Worked out at [employer].     Currently in Pain? Yes   Pain Score 3    Pain Location Neck   Pain Orientation Right;Left   Pain Type Acute pain                         [facility] Adult PT Treatment/Exercise - 10/06/16 0001      Moist Heat Therapy   Number Minutes Moist Heat 15 Minutes   Moist Heat Location Cervical     Electrical Stimulation   Electrical Stimulation Location neck   Electrical Stimulation Action IFC   Electrical Stimulation Parameters 15 min 6 ma to tolerance   Electrical Stimulation Goals Pain     Manual Therapy   Manual Therapy Soft tissue mobilization;Myofascial release;Muscle Energy Technique   Soft tissue mobilization bilateral upper traps, levators and bilateral cervical paraspinals   Myofascial Release upper trap triggerpoint releases   Muscle Energy Technique upper trap contract relax          Trigger  Point Dry Needling - 10/06/16 1141    Consent Given? Yes   Muscles Treated Upper Body Upper trapezius;Levator scapulae  bilateral cervical multifidi   Upper Trapezius Response Twitch reponse elicited;Palpable increased muscle length   Levator Scapulae Response Twitch response elicited;Palpable increased muscle length      Performed bilaterally.        PT Education - 10/06/16 1142    Education provided Yes   Education Details dry needling after care   Person(s) Educated Patient   Methods Handout;Explanation   Comprehension Verbalized understanding          PT Short Term Goals - 10/06/16 1204      PT SHORT TERM GOAL #1   Title pt will be independent with her HEP.    Status Achieved     PT SHORT TERM GOAL #2   Title Pt will report pain of less than or equal to  3/10 with ADL's in bilateral UE's.    Time 3   Period Weeks   Status On-going           PT Long Term Goals - 10/06/16 1206      PT LONG TERM GOAL #1   Title Pt will improve  her FOTO (shoulder) from 41% limitation to >/= 30 % limitaion.    Time 6   Period Weeks   Status On-going     PT LONG TERM GOAL #2   Title Pt will be able to perform overhead lifting to a shelf of an 5 pound object without difficulty.    Time 6   Period Weeks   Status On-going     PT LONG TERM GOAL #3   Title Pt will improve her Rt shoulder external rotation to 80 degrees.    Time 6   Period Weeks   Status On-going     PT LONG TERM GOAL #4   Title reduce FOTO (neck) to < or = to 26% limitation   Time 6   Period Weeks   Status On-going     PT LONG TERM GOAL #5   Title report a 60% reduction in neck pain with daily tasks   Time 6   Period Weeks   Status On-going     PT LONG TERM GOAL #6   Title demonstrate full cervical A/ROM to the Lt to improve driving   Time 6   Period Weeks   Status On-going               Plan - 10/06/16 1143    Clinical Impression Statement The patient has multiple tender points in  upper trapezius muscles bilaterally.  She is receptive to dry needling today since these tender points have persisted despite other treatment interventions.  Improved muscle length noted end of treatment session.  Therapist closely monitoring response with all treatment interventions.     PT Next Visit Plan assess response to first dry needling;  manual therapy; scapular and deep cervical flexor strengthening;  e-stim/heat as needed;  taping; shoulder therapy; patient to call back to schedule future appts when she knows her work schedule      Patient will benefit from skilled therapeutic intervention in order to improve the following deficits and impairments:     Visit Diagnosis: Cervicalgia  Cramp and spasm     Problem List Patient Active Problem List   Diagnosis Date Noted  . DDD (degenerative disc disease), cervical 09/13/2016  . Localized primary carpometacarpal osteoarthritis, right 08/26/2016  . Rotator cuff dysfunction, right 08/26/2016  . Metatarsalgia 08/01/2016  . Metatarsal stress fracture of left foot 04/19/2016  . Abnormal EKG 11/06/2014  . Chest pain 06/10/2014  . Plantar fasciitis, bilateral 11/25/2013   Ruben Im, PT 10/06/16 12:09 PM Phone: [phone] Fax: [phone]  Alvera Singh 10/06/2016, 12:08 PM  [facility] [address] [place], [place], [address] Phone: [phone]   Fax:  [phone]  Name: Gina Mitchell MRN: [id number] Date of Birth: 1961/01/26

## 2016-10-06 NOTE — Patient Instructions (Signed)
     Trigger Point Dry Needling  . What is Trigger Point Dry Needling (DN)? o DN is a physical therapy technique used to treat muscle pain and dysfunction. Specifically, DN helps deactivate muscle trigger points (muscle knots).  o A thin filiform needle is used to penetrate the skin and stimulate the underlying trigger point. The goal is for a local twitch response (LTR) to occur and for the trigger point to relax. No medication of any kind is injected during the procedure.   . What Does Trigger Point Dry Needling Feel Like?  o The procedure feels different for each individual patient. Some patients report that they do not actually feel the needle enter the skin and overall the process is not painful. Very mild bleeding may occur. However, many patients feel a deep cramping in the muscle in which the needle was inserted. This is the local twitch response.   . How Will I feel after the treatment? o Soreness is normal, and the onset of soreness may not occur for a few hours. Typically this soreness does not last longer than two days.  o Bruising is uncommon, however; ice can be used to decrease any possible bruising.  o In rare cases feeling tired or nauseous after the treatment is normal. In addition, your symptoms may get worse before they get better, this period will typically not last longer than 24 hours.   . What Can I do After My Treatment? o Increase your hydration by drinking more water for the next 24 hours. o You may place ice or heat on the areas treated that have become sore, however, do not use heat on inflamed or bruised areas. Heat often brings more relief post needling. o You can continue your regular activities, but vigorous activity is not recommended initially after the treatment for 24 hours. o DN is best combined with other physical therapy such as strengthening, stretching, and other therapies.    Stacy Simpson PT [facility] [address], [address] [place], [place] [address] Phone # [phone] Fax [phone] 

## 2016-10-11 ENCOUNTER — Ambulatory Visit: Payer: 59 | Admitting: Sports Medicine

## 2016-10-11 DIAGNOSIS — J0100 Acute maxillary sinusitis, unspecified: Secondary | ICD-10-CM | POA: Diagnosis not present

## 2016-10-11 MED FILL — AMOXICILLIN 875 MG TABLET: 875 | 10 days supply | Qty: 20 | Fill #0

## 2016-10-17 ENCOUNTER — Ambulatory Visit: Payer: 59 | Admitting: Sports Medicine

## 2016-10-21 ENCOUNTER — Ambulatory Visit: Payer: 59 | Admitting: Physical Therapy

## 2016-10-21 ENCOUNTER — Encounter: Payer: Self-pay | Admitting: Physical Therapy

## 2016-10-21 DIAGNOSIS — R252 Cramp and spasm: Secondary | ICD-10-CM

## 2016-10-21 DIAGNOSIS — M542 Cervicalgia: Secondary | ICD-10-CM

## 2016-10-21 NOTE — Therapy (Addendum)
[facility] [address], [place], [place], [address] Phone: [phone]   Fax:  [phone]  Physical Therapy Treatment  Patient Details  Name: Gina Mitchell MRN: [id number] Date of Birth: 1961-01-04 Referring Provider: Aundria Mems MD  Encounter Date: 10/21/2016      PT End of Session - 10/21/16 0937    Visit Number 7   Number of Visits 12   Date for PT Re-Evaluation 10/31/16   PT Start Time 0932   PT Stop Time 1029   PT Time Calculation (min) 57 min   Activity Tolerance Patient tolerated treatment well   Behavior During Therapy [facility] for tasks assessed/performed      Past Medical History:  Diagnosis Date  . Allergic rhinitis   . Chest pain radiating to jaw   . Esophageal reflux   . Incomplete right bundle branch block   . Situational anxiety     Past Surgical History:  Procedure Laterality Date  . Breast mass removal      There were no vitals filed for this visit.      Subjective Assessment - 10/21/16 0935    Subjective Pt reports doing well after needling, no significant change with mucle tone but feels like it helped. Neck feeling sore today, thinks she slept in a bad position.    Limitations House hold activities;Lifting   How long can you sit comfortably? 15 minutes   How long can you stand comfortably? 10 minutes   How long can you walk comfortably? only limited by stress fx in left metatarsal   Patient Stated Goals return to PLOF, be able to work pain free.    Pain Score 4    Pain Location Neck   Pain Orientation Right;Left   Pain Descriptors / Indicators Aching   Pain Type Acute pain   Pain Onset More than a month ago   Pain Frequency Intermittent   Aggravating Factors  work, reaching, household activity   Pain Relieving Factors Medication, rest   Effect of Pain on Daily Activities Difficult to work   Multiple Pain Sites No                         [facility]  Adult PT Treatment/Exercise - 10/21/16 0001      Neck Exercises: Machines for Strengthening   UBE (Upper Arm Bike) Level 1 x 6 minutes (3/3)  Therapist present to discuss treatment     Shoulder Exercises: Prone   Extension Strengthening;Both;20 reps  #2   Horizontal ABduction 1 Strengthening;Both;20 reps  #2   Other Prone Exercises --     Shoulder Exercises: Standing   Horizontal ABduction Strengthening;Both;10 reps  yellow   Extension Strengthening;Both;20 reps  #20   Row Strengthening;Both;20 reps  #20   Retraction Strengthening;Both;10 reps  yellow   Other Standing Exercises D1 extension, D2 extension  yellow     Moist Heat Therapy   Number Minutes Moist Heat 15 Minutes   Moist Heat Location Cervical     Electrical Stimulation   Electrical Stimulation Location neck   Electrical Stimulation Action IFC   Electrical Stimulation Parameters 15 minutes to tolerance   Electrical Stimulation Goals Pain     Manual Therapy   Manual Therapy Soft tissue mobilization   Soft tissue mobilization Rt upper trap   Muscle Energy Technique IASTM     Neck Exercises: Stretches   Upper Trapezius Stretch 3 reps;20 seconds   Other Neck Stretches  cervical flexion and rotation 3x20 seconds each                  PT Short Term Goals - 10/06/16 1204      PT SHORT TERM GOAL #1   Title pt will be independent with her HEP.    Status Achieved     PT SHORT TERM GOAL #2   Title Pt will report pain of less than or equal to  3/10 with ADL's in bilateral UE's.    Time 3   Period Weeks   Status On-going           PT Long Term Goals - 10/06/16 1206      PT LONG TERM GOAL #1   Title Pt will improve her FOTO (shoulder) from 41% limitation to >/= 30 % limitaion.    Time 6   Period Weeks   Status On-going     PT LONG TERM GOAL #2   Title Pt will be able to perform overhead lifting to a shelf of an 5 pound object without difficulty.    Time 6   Period Weeks   Status On-going      PT LONG TERM GOAL #3   Title Pt will improve her Rt shoulder external rotation to 80 degrees.    Time 6   Period Weeks   Status On-going     PT LONG TERM GOAL #4   Title reduce FOTO (neck) to < or = to 26% limitation   Time 6   Period Weeks   Status On-going     PT LONG TERM GOAL #5   Title report a 60% reduction in neck pain with daily tasks   Time 6   Period Weeks   Status On-going     PT LONG TERM GOAL #6   Title demonstrate full cervical A/ROM to the Lt to improve driving   Time 6   Period Weeks   Status On-going               Plan - 10/21/16 0945    Clinical Impression Statement Pt reports exercises have helped with posture especially at work. Pt has been very busy and has not been doing home exercsies. Responded well to dry needling but reports no significant change. Pt able to tolerate all exercises well. Reports feeling muscle soreness with exercsies and stretches. Pt will continue to benefit from skilled therapy for postural strength and stability.    Rehab Potential Good   PT Frequency 2x / week   PT Duration 6 weeks   PT Treatment/Interventions Therapeutic activities;Therapeutic exercise;Iontophoresis 96m/ml Dexamethasone;Electrical Stimulation;Cryotherapy;Vasopneumatic Device;Passive range of motion;Manual techniques;Dry needling;Taping;Functional mobility training;ADLs/Self Care Home Management;Moist Heat;Traction;Ultrasound;Neuromuscular re-education;Patient/family education   PT Next Visit Plan continue scapular strengthening, dry needling      Patient will benefit from skilled therapeutic intervention in order to improve the following deficits and impairments:  Impaired perceived functional ability, Decreased range of motion, Impaired UE functional use, Decreased activity tolerance, Decreased strength, Postural dysfunction, Improper body mechanics, Decreased mobility, Increased muscle spasms  Visit Diagnosis: Cervicalgia  Cramp and  spasm     Problem List Patient Active Problem List   Diagnosis Date Noted  . DDD (degenerative disc disease), cervical 09/13/2016  . Localized primary carpometacarpal osteoarthritis, right 08/26/2016  . Rotator cuff dysfunction, right 08/26/2016  . Metatarsalgia 08/01/2016  . Metatarsal stress fracture of left foot 04/19/2016  . Abnormal EKG 11/06/2014  . Chest pain 06/10/2014  . Plantar fasciitis, bilateral 11/25/2013  Mikle Bosworth PTA 10/21/2016, 10:25 AM PHYSICAL THERAPY DISCHARGE SUMMARY  Visits from Start of Care: 7  Current functional level related to goals / functional outcomes: Pt attended 7 PT sessions and didn't return after visit on 10/21/16.  See above for most current status.     Remaining deficits: See above for most current status.     Education / Equipment: HEP, posture Plan: Patient agrees to discharge.  Patient goals were partially met. Patient is being discharged due to not returning since the last visit.  ?????        Sigurd Sos, PT 11/21/16 3:03 PM  [facility] [address], [place], [place], [address] Phone: [phone]   Fax:  [phone]  Name: Gina Mitchell MRN: [id number] Date of Birth: May 30, 1961

## 2016-10-28 ENCOUNTER — Ambulatory Visit (INDEPENDENT_AMBULATORY_CARE_PROVIDER_SITE_OTHER): Payer: 59 | Admitting: Sports Medicine

## 2016-10-28 ENCOUNTER — Encounter: Payer: Self-pay | Admitting: Sports Medicine

## 2016-10-28 DIAGNOSIS — M722 Plantar fascial fibromatosis: Secondary | ICD-10-CM

## 2016-10-28 DIAGNOSIS — M19031 Primary osteoarthritis, right wrist: Secondary | ICD-10-CM | POA: Diagnosis not present

## 2016-10-28 DIAGNOSIS — M5030 Other cervical disc degeneration, unspecified cervical region: Secondary | ICD-10-CM | POA: Diagnosis not present

## 2016-10-28 NOTE — Assessment & Plan Note (Signed)
Multilevel degenerative disc disease with only trace anterolisthesis at several levels, she is doing extremely well with formal physical therapy. Continue arthritis strength Tylenol, return as needed for this, MRI for interventional planning if no better.

## 2016-10-28 NOTE — Progress Notes (Signed)
  Subjective:    CC: Follow-up  HPI: Right trapeziometacarpal joint arthritis: Overall doing well after injection.  Foot pain/stress fracture: Resolved  Plantar fasciitis: Starting to have a recurrence of pain, despite custom orthotics.  Cervical degenerative disc disease: Resolved with physical therapy.  Past medical history:  Negative.  See flowsheet/record as well for more information.  Surgical history: Negative.  See flowsheet/record as well for more information.  Family history: Negative.  See flowsheet/record as well for more information.  Social history: Negative.  See flowsheet/record as well for more information.  Allergies, and medications have been entered into the medical record, reviewed, and no changes needed.   Review of Systems: No fevers, chills, night sweats, weight loss, chest pain, or shortness of breath.   Objective:    General: Well Developed, well nourished, and in no acute distress.  Neuro: Alert and oriented x3, extra-ocular muscles intact, sensation grossly intact.  HEENT: Normocephalic, atraumatic, pupils equal round reactive to light, neck supple, no masses, no lymphadenopathy, thyroid nonpalpable.  Skin: Warm and dry, no rashes. Cardiac: Regular rate and rhythm, no murmurs rubs or gallops, no lower extremity edema.  Respiratory: Clear to auscultation bilaterally. Not using accessory muscles, speaking in full sentences.  Impression and Recommendations:    Plantar fasciitis, bilateral Doing extremely well with custom orthotics, I added some gel cups as she was having a bit of increasing pain. She is going to have Dr. Oneida Alar make her another set of orthotics. Return as needed for this.  Localized primary carpometacarpal osteoarthritis, right Overall doing well post injection, I added a short thumb spica brace.   DDD (degenerative disc disease), cervical Multilevel degenerative disc disease with only trace anterolisthesis at several levels, she is  doing extremely well with formal physical therapy. Continue arthritis strength Tylenol, return as needed for this, MRI for interventional planning if no better.

## 2016-10-28 NOTE — Assessment & Plan Note (Signed)
Doing extremely well with custom orthotics, I added some gel cups as she was having a bit of increasing pain. She is going to have Dr. Oneida Alar make her another set of orthotics. Return as needed for this.

## 2016-10-28 NOTE — Assessment & Plan Note (Signed)
Overall doing well post injection, I added a short thumb spica brace.

## 2016-12-26 DIAGNOSIS — H5213 Myopia, bilateral: Secondary | ICD-10-CM | POA: Diagnosis not present

## 2016-12-26 DIAGNOSIS — H524 Presbyopia: Secondary | ICD-10-CM | POA: Diagnosis not present

## 2017-03-03 DIAGNOSIS — H00015 Hordeolum externum left lower eyelid: Secondary | ICD-10-CM | POA: Diagnosis not present

## 2017-03-03 MED FILL — NEO/POLYMYXIN/DEXAMETH DROP: 3.5-10000-0 | 10 days supply | Qty: 5 | Fill #0

## 2017-03-06 DIAGNOSIS — M25511 Pain in right shoulder: Secondary | ICD-10-CM | POA: Diagnosis not present

## 2017-03-06 MED FILL — MELOXICAM 15 MG TABLET: 15 | 30 days supply | Qty: 30 | Fill #0

## 2017-04-20 DIAGNOSIS — R0789 Other chest pain: Secondary | ICD-10-CM | POA: Diagnosis not present

## 2017-04-20 DIAGNOSIS — M26629 Arthralgia of temporomandibular joint, unspecified side: Secondary | ICD-10-CM | POA: Diagnosis not present

## 2017-04-20 DIAGNOSIS — R6884 Jaw pain: Secondary | ICD-10-CM | POA: Diagnosis not present

## 2017-04-26 MED FILL — MELOXICAM 15 MG TABLET: 15 | 30 days supply | Qty: 30 | Fill #0

## 2017-05-08 ENCOUNTER — Other Ambulatory Visit: Payer: Self-pay | Admitting: Obstetrics and Gynecology

## 2017-05-08 DIAGNOSIS — Z1231 Encounter for screening mammogram for malignant neoplasm of breast: Secondary | ICD-10-CM

## 2017-05-16 DIAGNOSIS — M25542 Pain in joints of left hand: Secondary | ICD-10-CM | POA: Diagnosis not present

## 2017-05-16 DIAGNOSIS — M25541 Pain in joints of right hand: Secondary | ICD-10-CM | POA: Diagnosis not present

## 2017-05-17 ENCOUNTER — Ambulatory Visit
Admission: RE | Admit: 2017-05-17 | Discharge: 2017-05-17 | Disposition: A | Discharge status: Home / Self Care | Payer: 59 | Source: Ambulatory Visit | Attending: Family Medicine | Admitting: Family Medicine

## 2017-05-17 ENCOUNTER — Other Ambulatory Visit: Payer: Self-pay | Admitting: Family Medicine

## 2017-05-17 DIAGNOSIS — M19042 Primary osteoarthritis, left hand: Secondary | ICD-10-CM | POA: Diagnosis not present

## 2017-05-17 DIAGNOSIS — M25542 Pain in joints of left hand: Secondary | ICD-10-CM

## 2017-05-22 ENCOUNTER — Ambulatory Visit
Admission: RE | Admit: 2017-05-22 | Discharge: 2017-05-22 | Disposition: A | Discharge status: Home / Self Care | Payer: 59 | Source: Ambulatory Visit | Attending: Obstetrics and Gynecology | Admitting: Obstetrics and Gynecology

## 2017-05-22 DIAGNOSIS — Z1231 Encounter for screening mammogram for malignant neoplasm of breast: Secondary | ICD-10-CM | POA: Diagnosis not present

## 2017-05-29 DIAGNOSIS — Z01419 Encounter for gynecological examination (general) (routine) without abnormal findings: Secondary | ICD-10-CM | POA: Diagnosis not present

## 2017-05-29 DIAGNOSIS — M25439 Effusion, unspecified wrist: Secondary | ICD-10-CM | POA: Diagnosis not present

## 2017-05-29 DIAGNOSIS — Z124 Encounter for screening for malignant neoplasm of cervix: Secondary | ICD-10-CM | POA: Diagnosis not present

## 2017-05-29 DIAGNOSIS — Z1211 Encounter for screening for malignant neoplasm of colon: Secondary | ICD-10-CM | POA: Diagnosis not present

## 2017-05-29 DIAGNOSIS — R109 Unspecified abdominal pain: Secondary | ICD-10-CM | POA: Diagnosis not present

## 2017-05-30 DIAGNOSIS — M25512 Pain in left shoulder: Secondary | ICD-10-CM | POA: Diagnosis not present

## 2017-05-30 DIAGNOSIS — K219 Gastro-esophageal reflux disease without esophagitis: Secondary | ICD-10-CM | POA: Diagnosis not present

## 2017-05-30 MED FILL — predniSONE 10 MG TABS: 10 | 6 days supply | Qty: 21 | Fill #0

## 2017-06-20 ENCOUNTER — Ambulatory Visit: Payer: 59 | Admitting: Sports Medicine

## 2017-06-22 MED FILL — PANTOPRAZOLE SOD DR 40 MG T: 40 | 90 days supply | Qty: 90 | Fill #0

## 2017-06-26 ENCOUNTER — Ambulatory Visit (INDEPENDENT_AMBULATORY_CARE_PROVIDER_SITE_OTHER): Payer: 59 | Admitting: Sports Medicine

## 2017-06-26 ENCOUNTER — Telehealth: Payer: Self-pay | Admitting: Family Medicine

## 2017-06-26 ENCOUNTER — Ambulatory Visit (INDEPENDENT_AMBULATORY_CARE_PROVIDER_SITE_OTHER): Payer: 59

## 2017-06-26 DIAGNOSIS — M7731 Calcaneal spur, right foot: Secondary | ICD-10-CM | POA: Diagnosis not present

## 2017-06-26 DIAGNOSIS — M79642 Pain in left hand: Secondary | ICD-10-CM | POA: Diagnosis not present

## 2017-06-26 DIAGNOSIS — M25471 Effusion, right ankle: Secondary | ICD-10-CM

## 2017-06-26 DIAGNOSIS — M2550 Pain in unspecified joint: Secondary | ICD-10-CM | POA: Diagnosis not present

## 2017-06-26 DIAGNOSIS — M069 Rheumatoid arthritis, unspecified: Secondary | ICD-10-CM | POA: Insufficient documentation

## 2017-06-26 DIAGNOSIS — M25542 Pain in joints of left hand: Secondary | ICD-10-CM

## 2017-06-26 DIAGNOSIS — M25541 Pain in joints of right hand: Secondary | ICD-10-CM | POA: Diagnosis not present

## 2017-06-26 DIAGNOSIS — M189 Osteoarthritis of first carpometacarpal joint, unspecified: Secondary | ICD-10-CM | POA: Diagnosis not present

## 2017-06-26 DIAGNOSIS — M059 Rheumatoid arthritis with rheumatoid factor, unspecified: Secondary | ICD-10-CM | POA: Diagnosis not present

## 2017-06-26 DIAGNOSIS — M7742 Metatarsalgia, left foot: Secondary | ICD-10-CM | POA: Diagnosis not present

## 2017-06-26 DIAGNOSIS — M25571 Pain in right ankle and joints of right foot: Secondary | ICD-10-CM | POA: Diagnosis not present

## 2017-06-26 LAB — CBC WITH DIFFERENTIAL/PLATELET
Basophils Absolute: 104 cells/uL (ref 0–200)
Basophils Relative: 1 %
Eosinophils Absolute: 208 cells/uL (ref 15–500)
Eosinophils Relative: 2 %
HCT: 39.1 % (ref 35.0–45.0)
Hemoglobin: 13.3 g/dL (ref 11.7–15.5)
Lymphocytes Relative: 24 %
Lymphs Abs: 2496 cells/uL (ref 850–3900)
MCH: 30.9 pg (ref 27.0–33.0)
MCHC: 34 g/dL (ref 32.0–36.0)
MCV: 90.9 fL (ref 80.0–100.0)
MPV: 9.5 fL (ref 7.5–12.5)
Monocytes Absolute: 728 cells/uL (ref 200–950)
Monocytes Relative: 7 %
Neutro Abs: 6864 cells/uL (ref 1500–7800)
Neutrophils Relative %: 66 %
Platelets: 272 10*3/uL (ref 140–400)
RBC: 4.3 MIL/uL (ref 3.80–5.10)
RDW: 13.4 % (ref 11.0–15.0)
WBC: 10.4 10*3/uL (ref 3.8–10.8)

## 2017-06-26 LAB — SEDIMENTATION RATE: Sed Rate: 21 mm/hr (ref 0–30)

## 2017-06-26 NOTE — Progress Notes (Addendum)
Subjective:    I'm seeing this patient as a consultation for:  Dr. 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 She also developed swelling of her left thumb interphalangeal joint, and widespread polyarthralgias. Labs showed what sounds to be a mild elevation in the ESR as well as rheumatoid factor. She was referred to rheumatology. Appointment is not for a long time. She is here for further evaluation of the pain in her right ankle, swelling. As well as her polyarthralgia. Symptoms are moderate, persistent.  Past medical history, Surgical history, Family history not pertinant except as noted below, Social history, Allergies, and medications have been entered into the medical record, reviewed, and no changes needed.   Review of Systems: No headache, visual changes, nausea, vomiting, diarrhea, constipation, dizziness, abdominal pain, skin rash, fevers, chills, night sweats, weight loss, swollen lymph nodes, body aches, joint swelling, muscle aches, chest pain, shortness of breath, mood changes, visual or auditory hallucinations.   Objective:   General: Well Developed, well nourished, and in no acute distress.  Neuro:  Extra-ocular muscles intact, able to move all 4 extremities, sensation grossly intact.  Deep tendon reflexes tested were normal. Psych: Alert and oriented, mood congruent with affect. ENT:  Ears and nose appear unremarkable.  Hearing grossly normal. Neck: Unremarkable overall appearance, trachea midline.  No visible thyroid enlargement. Eyes: Conjunctivae and lids appear unremarkable.  Pupils equal and round. Skin: Warm and dry, no rashes noted.  Cardiovascular: Pulses palpable, no extremity edema. Right Ankle: Visibly swollen at the posterior ankle Range of motion is full in all  directions. Strength is 5/5 in all directions. Stable lateral and medial ligaments; squeeze test and kleiger test unremarkable; Talar dome nontender; No pain at base of 5th MT; No tenderness over cuboid; No tenderness over N spot or navicular prominence No tenderness on posterior aspects of lateral and medial malleolus No sign of peroneal tendon subluxations; Negative tarsal tunnel tinel's Able to walk 4 steps. Hands: No evidence of synovitis in the proximal, distal interphalangeal joints are the MCPs. She does have some synovitis and swelling in the left thumb interphalangeal joint. No rheumatoid nodules, no ulnar deviation of the chest. Left Foot: No visible erythema or swelling. Range of motion is full in all directions. Strength is 5/5 in all directions. No hallux valgus. No pes cavus or pes planus. No abnormal callus noted. No pain over the navicular prominence, or base of fifth metatarsal. No tenderness to palpation of the calcaneal insertion of plantar fascia. No pain at the Achilles insertion. No pain over the calcaneal bursa. No pain of the retrocalcaneal bursa. No tenderness to palpation over the tarsals, metatarsals, or phalanges. No hallux rigidus or limitus. Third MTP metatarsalgia No pain with compression of the metatarsal heads. Neurovascularly intact distally.  Impression and Recommendations:   This case required medical decision making of moderate complexity.  Right ankle swelling There is currently significant synovitis. She been treated 2 months ago by her PCP with conservative measures including steroids were provided temporary relief. She does have significant swelling now, so we are going to proceed with x-ray and MRI. Also when to increase her ibuprofen to 800 mg 3 times a day.  Metatarsalgia Right-sided, did not bring her tennis shoes or orthotics, simply wearing sandals. When she brings her orthotics I can place a  metatarsal pad.  Rheumatoid  arthritis (HCC) Question rheumatoid arthritis. . It sounds as though she had a mildly elevated sedimentation rate and mildly elevated rheumatoid factor. I'm going to recheck this but include CCP.  We're also going to x-ray her hands.  CCP and rheumatoid factor are both elevated, combined with her symptoms this is pathognomonic for rheumatoid arthritis, referral to Dr. Amil Amen as requested.

## 2017-06-26 NOTE — Telephone Encounter (Signed)
Yes, I think it would be good to get a second data point, and she also told me that her rheumatoid factor was equivocal so we should probably do it again.

## 2017-06-26 NOTE — Assessment & Plan Note (Signed)
There is currently significant synovitis. She been treated 2 months ago by her PCP with conservative measures including steroids were provided temporary relief. She does have significant swelling now, so we are going to proceed with x-ray and MRI. Also when to increase her ibuprofen to 800 mg 3 times a day.

## 2017-06-26 NOTE — Telephone Encounter (Signed)
Pt called. She states she had CBC, CMET, Sed Rate and Rheumatoid Factor done several weeks ago and wants to know if she need to repeat all of these tests.  Thank you.

## 2017-06-26 NOTE — Assessment & Plan Note (Signed)
Right-sided, did not bring her tennis shoes or orthotics, simply wearing sandals. When she brings her orthotics I can place a metatarsal pad.

## 2017-06-26 NOTE — Assessment & Plan Note (Addendum)
Question rheumatoid arthritis. . It sounds as though she had a mildly elevated sedimentation rate and mildly elevated rheumatoid factor. I'm going to recheck this but include CCP.  We're also going to x-ray her hands.  CCP and rheumatoid factor are both elevated, combined with her symptoms this is pathognomonic for rheumatoid arthritis, referral to Dr. Amil Amen as requested.

## 2017-06-27 LAB — COMPREHENSIVE METABOLIC PANEL
ALT: 9 U/L (ref 6–29)
AST: 22 U/L (ref 10–35)
Albumin: 4.1 g/dL (ref 3.6–5.1)
Alkaline Phosphatase: 79 U/L (ref 33–130)
BUN: 15 mg/dL (ref 7–25)
CO2: 24 mmol/L (ref 20–32)
Calcium: 9.1 mg/dL (ref 8.6–10.4)
Chloride: 102 mmol/L (ref 98–110)
Creat: 0.82 mg/dL (ref 0.50–1.05)
Glucose, Bld: 89 mg/dL (ref 65–99)
Potassium: 4.1 mmol/L (ref 3.5–5.3)
Sodium: 139 mmol/L (ref 135–146)
Total Bilirubin: 0.5 mg/dL (ref 0.2–1.2)
Total Protein: 6.8 g/dL (ref 6.1–8.1)

## 2017-06-27 LAB — URIC ACID: Uric Acid, Serum: 5 mg/dL (ref 2.5–7.0)

## 2017-06-27 LAB — CK: Total CK: 62 U/L (ref 29–143)

## 2017-06-27 LAB — CYCLIC CITRUL PEPTIDE ANTIBODY, IGG: Cyclic Citrullin Peptide Ab: 165 Units — ABNORMAL HIGH

## 2017-06-27 LAB — RHEUMATOID FACTOR: Rheumatoid fact SerPl-aCnc: 24 [IU]/mL — ABNORMAL HIGH (ref <14)

## 2017-06-27 NOTE — Addendum Note (Signed)
Addended by: Silverio Decamp on: 06/27/2017 02:30 PM   Modules accepted: Orders

## 2017-06-29 ENCOUNTER — Ambulatory Visit: Payer: 59 | Admitting: Sports Medicine

## 2017-06-30 ENCOUNTER — Ambulatory Visit: Payer: 59 | Admitting: Sports Medicine

## 2017-07-06 DIAGNOSIS — H538 Other visual disturbances: Secondary | ICD-10-CM | POA: Diagnosis not present

## 2017-07-06 DIAGNOSIS — G4489 Other headache syndrome: Secondary | ICD-10-CM | POA: Diagnosis not present

## 2017-07-06 DIAGNOSIS — R200 Anesthesia of skin: Secondary | ICD-10-CM | POA: Diagnosis not present

## 2017-07-06 DIAGNOSIS — K120 Recurrent oral aphthae: Secondary | ICD-10-CM | POA: Diagnosis not present

## 2017-07-07 ENCOUNTER — Other Ambulatory Visit: Payer: Self-pay | Admitting: Family Medicine

## 2017-07-07 DIAGNOSIS — G4489 Other headache syndrome: Secondary | ICD-10-CM

## 2017-07-10 ENCOUNTER — Ambulatory Visit (INDEPENDENT_AMBULATORY_CARE_PROVIDER_SITE_OTHER): Payer: 59

## 2017-07-10 DIAGNOSIS — G4489 Other headache syndrome: Secondary | ICD-10-CM | POA: Diagnosis not present

## 2017-07-10 DIAGNOSIS — R42 Dizziness and giddiness: Secondary | ICD-10-CM

## 2017-07-10 DIAGNOSIS — H538 Other visual disturbances: Secondary | ICD-10-CM

## 2017-07-10 DIAGNOSIS — R51 Headache: Secondary | ICD-10-CM | POA: Diagnosis not present

## 2017-07-16 ENCOUNTER — Other Ambulatory Visit: Payer: 59

## 2017-07-18 DIAGNOSIS — M25561 Pain in right knee: Secondary | ICD-10-CM | POA: Diagnosis not present

## 2017-07-18 DIAGNOSIS — M2550 Pain in unspecified joint: Secondary | ICD-10-CM | POA: Diagnosis not present

## 2017-07-18 MED FILL — predniSONE 10 MG TABS: 10 | 12 days supply | Qty: 30 | Fill #0

## 2017-08-02 DIAGNOSIS — M2550 Pain in unspecified joint: Secondary | ICD-10-CM | POA: Diagnosis not present

## 2017-08-02 DIAGNOSIS — R5382 Chronic fatigue, unspecified: Secondary | ICD-10-CM | POA: Diagnosis not present

## 2017-08-02 DIAGNOSIS — M0579 Rheumatoid arthritis with rheumatoid factor of multiple sites without organ or systems involvement: Secondary | ICD-10-CM | POA: Diagnosis not present

## 2017-08-02 DIAGNOSIS — Z6822 Body mass index (BMI) 22.0-22.9, adult: Secondary | ICD-10-CM | POA: Diagnosis not present

## 2017-08-02 MED FILL — BD TB SYRINGE 27GX1/2: 27G X 1/2" | 84 days supply | Qty: 12 | Fill #0

## 2017-08-02 MED FILL — BD TB SYRINGE 27GX1/2": 27G X 1/2" | 84 days supply | Qty: 12 | Fill #0

## 2017-08-02 MED FILL — METHOTREXATE 25 MG/ML VIAL: 50 | 84 days supply | Qty: 8 | Fill #0

## 2017-08-02 MED FILL — FOLIC ACID 1 MG TABLET: 1 | 30 days supply | Qty: 30 | Fill #0 | Status: TO

## 2017-08-04 NOTE — Progress Notes (Signed)
Office Visit Note  Patient: Gina Mitchell             Date of Birth: 1961/08/31           MRN: [id number]             PCP: Shirline Frees, MD Referring: Shirline Frees, MD Visit Date: 08/14/2017 Occupation: RN ICU    Subjective:  New Patient (Initial Visit) (Bil hand and ankle pain, swelling)   History of Present Illness: Gina Mitchell is a 56 y.o. female seen for evaluation of possible rheumatoid arthritis. According to patient her symptoms is started couple of months ago with pain in multiple joints. She was seen by her PCP and had 2 courses of steroids. She states that she took steroids her symptoms improved and then recurred. Her last episode started about 1 month ago. She states the swelling started in her right knee and then her right ankle joint. She's been also having swelling in her left thumb. She's having pain in her bilateral CMC joints for ome time. She was recently seen by Dr. Dianah Field who did labs on her which were positive for rheumatoid arthritis. He did MRI of her right ankle joint which revealed synovitis and subtalar effusion.she finished her last prednisone taper about 3 weeks ago.  Activities of Daily Living:  Patient reports morning stiffness for  minutes.   Patient Reports nocturnal pain.  Difficulty dressing/grooming: Denies Difficulty climbing stairs: Reports Difficulty getting out of chair: Reports Difficulty using hands for taps, buttons, cutlery, and/or writing: Denies   Review of Systems  Constitutional: Negative for fatigue, night sweats, weight gain, weight loss and weakness.  HENT: Negative for mouth sores, trouble swallowing, trouble swallowing, mouth dryness and nose dryness.   Eyes: Positive for dryness. Negative for pain, redness and visual disturbance.  Respiratory: Negative for cough, shortness of breath and difficulty breathing.   Cardiovascular: Negative for chest pain, palpitations, hypertension, irregular heartbeat and swelling in  legs/feet.  Gastrointestinal: Negative for blood in stool, constipation and diarrhea.  Endocrine: Negative for increased urination.  Genitourinary: Negative for vaginal dryness.  Musculoskeletal: Positive for arthralgias, joint pain, joint swelling and morning stiffness. Negative for myalgias, muscle weakness, muscle tenderness and myalgias.  Skin: Negative for color change, rash, hair loss, skin tightness, ulcers and sensitivity to sunlight.  Allergic/Immunologic: Negative for susceptible to infections.  Neurological: Negative for dizziness, memory loss and night sweats.  Hematological: Negative for swollen glands.  Psychiatric/Behavioral: Negative for depressed mood and sleep disturbance. The patient is not nervous/anxious.     PMFS History:  Patient Active Problem List   Diagnosis Date Noted  . Right ankle swelling 06/26/2017  . Rheumatoid arthritis ([place]) 06/26/2017  . DDD (degenerative disc disease), cervical 09/13/2016  . Localized primary carpometacarpal osteoarthritis, right 08/26/2016  . Rotator cuff dysfunction, right 08/26/2016  . Metatarsalgia 08/01/2016  . Metatarsal stress fracture of left foot 04/19/2016  . Abnormal EKG 11/06/2014  . Chest pain 06/10/2014  . Plantar fasciitis, bilateral 11/25/2013    Past Medical History:  Diagnosis Date  . Allergic rhinitis   . Chest pain radiating to jaw   . Esophageal reflux   . Incomplete right bundle branch block   . Situational anxiety     Family History  Problem Relation Age of Onset  . Prostate cancer Father   . Arrhythmia Father   . Breast cancer Sister   . Hypertension Mother    Past Surgical History:  Procedure Laterality Date  . Breast  mass removal     Social History   Social History Narrative  . No narrative on file     Objective: Vital Signs: BP 136/86 (BP Location: Left Arm, Patient Position: Sitting, Cuff Size: Normal)   Pulse 70   Resp 14   Ht 5' 2.5" (1.588 m)   Wt 124 lb (56.2 kg)   BMI 22.32  kg/m    Physical Exam  Constitutional: She is oriented to person, place, and time. She appears well-developed and well-nourished.  HENT:  Head: Normocephalic and atraumatic.  Eyes: Conjunctivae and EOM are normal.  Neck: Normal range of motion.  Cardiovascular: Normal rate, regular rhythm, normal heart sounds and intact distal pulses.   Pulmonary/Chest: Effort normal and breath sounds normal.  Abdominal: Soft. Bowel sounds are normal.  Lymphadenopathy:    She has no cervical adenopathy.  Neurological: She is alert and oriented to person, place, and time.  Skin: Skin is warm and dry. Capillary refill takes less than 2 seconds.  Psychiatric: She has a normal mood and affect. Her behavior is normal.  Nursing note and vitals reviewed.    Musculoskeletal Exam: C-spine and thoracic lumbar spine good range of motion. Shoulder joints, elbow joints, wrist joints are good range of motion. She had no synovitis over MCPs or PIPs of right hand. Left hand first PIP had swelling and synovitis. Hip joints knee joints are good range of motion with no swelling. Her right ankle joint is swollen and painful. She also had tenderness across her MTPs.  CDAI Exam: CDAI Homunculus Exam:   Tenderness:  Left hand: 1st PIP RLE: tibiotalar  Swelling:  Left hand: 1st PIP RLE: tibiotalar  Joint Counts:  CDAI Tender Joint count: 1 CDAI Swollen Joint count: 1  Global Assessments:  Patient Global Assessment: 5 Provider Global Assessment: 5  CDAI Calculated Score: 12    Investigation: Findings:  05/16/2017 CBC normal, CMP normal, Sed Rate Elevated 24, Rheumatoid Factor elevated 20.4 TSH normal, and  LDL 657 8/46/96 Cyclic citrul peptide antibody, IgG 165,  Rheumatoid factor 24, CK normal, Sed Rate normal, and Uric Acid normal     CBC Latest Ref Rng & Units 06/26/2017  WBC 3.8 - 10.8 K/uL 10.4  Hemoglobin 11.7 - 15.5 g/dL 13.3  Hematocrit 35.0 - 45.0 % 39.1  Platelets 140 - 400 K/uL 272   CMP  Latest Ref Rng & Units 06/26/2017  Glucose 65 - 99 mg/dL 89  BUN 7 - 25 mg/dL 15  Creatinine 0.50 - 1.05 mg/dL 0.82  Sodium 135 - 146 mmol/L 139  Potassium 3.5 - 5.3 mmol/L 4.1  Chloride 98 - 110 mmol/L 102  CO2 20 - 32 mmol/L 24  Calcium 8.6 - 10.4 mg/dL 9.1  Total Protein 6.1 - 8.1 g/dL 6.8  Total Bilirubin 0.2 - 1.2 mg/dL 0.5  Alkaline Phos 33 - 130 U/L 79  AST 10 - 35 U/L 22  ALT 6 - 29 U/L 9     Imaging: Xr Foot 2 Views Left  Result Date: 08/14/2017 PIP/DIP narrowing was noted. No MTP joint narrowing or erosive changes were noted. No intertarsal joint space narrowing was noted. A small calcaneal spur was noted.  Xr Foot 2 Views Right  Result Date: 08/14/2017 PIP/DIP narrowing was noted. No MTP joint narrowing or erosive changes were noted. No intertarsal joint space narrowing was noted. A small calcaneal spur was noted.  Xr Hand 2 View Left  Result Date: 08/14/2017 Juxta articular osteopenia noted. Mild PIP narrowing was noted.No  intercarpal or radiocarpal joint space narrowing was noted. No erosive changes were noted.  Xr Hand 2 View Right  Result Date: 08/14/2017 Juxta articular osteopenia noted no significant MCP joint space narrowing noted. Mild PIP joint narrowing noted. No intercarpal joint space or radiocarpal joint space narrowing was noted. No erosive changes were noted.  Xr Knee 3 View Left  Result Date: 08/14/2017 Mild medial compartment narrowing was noted. No chondrocalcinosis was noted. Mild patellofemoral narrowing was noted. Impression:These findings are consistent with mild osteoarthritis of the knee joint and mild chondromalacia patella  Xr Knee 3 View Right  Result Date: 08/14/2017 Mild medial compartment narrowing was noted. No chondrocalcinosis was noted. Mild patellofemoral narrowing was noted. Impression:These findings are consistent with mild osteoarthritis of the knee joint and mild chondromalacia patella   Speciality Comments: No  specialty comments available.    Procedures:  No procedures performed Allergies: Crab [shellfish allergy]   Assessment / Plan:     Visit Diagnoses: Rheumatoid arthritis involving multiple sites with positive rheumatoid factor + CCP ([place]) -she has active synovitis on examination as described above. She was accompanied by her husband today.we had detailed discussion regarding rheumatoid arthritis. Detailed counseling regarding different treatment options and side effects were discussed.after reviewing indications side effects contraindications we decided to proceed with subcutaneous methotrexate. I will obtain following labs and x-rays today.. Plan: Urinalysis, Routine w reflex microscopic, ANA, Hepatitis panel, acute, HIV antibody, Quantiferon tb gold assay (blood), Serum protein electrophoresis with reflex, IgG, IgA, IgM, DG Chest 2 View. Once we have results available the plan is to start her on methotrexate 0.6 mL subcutaneous every week and increase it to 0.8 ML subcutaneous every week after 2 weeks if labs are stable. We will also add folic acid 2 mg by mouth daily. Labs will be checked every 2 weeks 2 and then every 2 months.as she is having a lot of discomfort I will also give her a prednisone taper she will take 10 mg for one week, 7.5 mg for one week, 5 mg for one week, 2.5 mg per week. This will work as a bridging therapy until methotrexate gets into her system.  Rotator cuff dysfunction, right: Chronic pain.  Bilateral hand pain - Plan: XR Hand 2 View Right, XR Hand 2 View Left,x-rays today were unremarkable except for mild juxta articular osteopenia.   Chronic pain of both knees - Plan: XR KNEE 3 VIEW LEFT, XR KNEE 3 VIEW RIGHT. X-ray showed mild osteoarthritis .  Bilateral foot pain - Plan: XR Foot 2 Views Left, XR Foot 2 Views Right. X-rays were unremarkable.  High risk medication use - Plan: COMPLETE METABOLIC PANEL WITH GFR, CBC with Differential/Platelet  Localized primary  carpometacarpal osteoarthritis, right  Plantar fasciitis, bilateral  DDD (degenerative disc disease), cervical  History of gastroesophageal reflux (GERD)  History of hyperlipidemia  Osteopenia of multiple sites   History of immunosuppression - Plan: DG Chest 2 View    Orders: Orders Placed This Encounter  Procedures  . XR Hand 2 View Right  . XR Hand 2 View Left  . XR KNEE 3 VIEW LEFT  . XR KNEE 3 VIEW RIGHT  . XR Foot 2 Views Left  . XR Foot 2 Views Right  . DG Chest 2 View  . Urinalysis, Routine w reflex microscopic  . ANA  . Hepatitis panel, acute  . HIV antibody  . Quantiferon tb gold assay (blood)  . Serum protein electrophoresis with reflex  . IgG, IgA, IgM  .  COMPLETE METABOLIC PANEL WITH GFR  . CBC with Differential/Platelet   Meds ordered this encounter  Medications  . folic acid (FOLVITE) 1 MG tablet    Sig: Take 2 tablets (2 mg total) by mouth daily.    Dispense:  180 tablet    Refill:  3  . predniSONE (DELTASONE) 5 MG tablet    Sig: 10 mg daily x 1 week, 7.5 mg  Daily x 1 week, 5 mg daily x 1 week, 2.5 mg  Daily x 1 week    Dispense:  35 tablet    Refill:  0  . sharps container    Sig: 1 each by Does not apply route as needed.    Dispense:  1 each    Refill:  6    Face-to-face time spent with patient was 60 minutes.greater than 50% of time was spent in counseling and coordination of care.  Follow-Up Instructions: Return for Rheumatoid arthritis.   Bo Merino, MD  Note - This record has been created using Editor, commissioning.  Chart creation errors have been sought, but may not always  have been located. Such creation errors do not reflect on  the standard of medical care.

## 2017-08-07 MED FILL — predniSONE 5 MG TABS: 5 | 15 days supply | Qty: 30 | Fill #0

## 2017-08-14 ENCOUNTER — Encounter: Payer: Self-pay | Admitting: Rheumatology

## 2017-08-14 ENCOUNTER — Ambulatory Visit (INDEPENDENT_AMBULATORY_CARE_PROVIDER_SITE_OTHER): Payer: 59

## 2017-08-14 ENCOUNTER — Ambulatory Visit (INDEPENDENT_AMBULATORY_CARE_PROVIDER_SITE_OTHER): Payer: 59 | Admitting: Rheumatology

## 2017-08-14 ENCOUNTER — Ambulatory Visit (INDEPENDENT_AMBULATORY_CARE_PROVIDER_SITE_OTHER): Payer: Self-pay

## 2017-08-14 VITALS — BP 136/86 | HR 70 | Resp 14 | Ht 62.5 in | Wt 124.0 lb

## 2017-08-14 DIAGNOSIS — Z9225 Personal history of immunosupression therapy: Secondary | ICD-10-CM

## 2017-08-14 DIAGNOSIS — M79672 Pain in left foot: Secondary | ICD-10-CM

## 2017-08-14 DIAGNOSIS — M79642 Pain in left hand: Secondary | ICD-10-CM | POA: Diagnosis not present

## 2017-08-14 DIAGNOSIS — M25561 Pain in right knee: Secondary | ICD-10-CM

## 2017-08-14 DIAGNOSIS — M722 Plantar fascial fibromatosis: Secondary | ICD-10-CM | POA: Diagnosis not present

## 2017-08-14 DIAGNOSIS — M0579 Rheumatoid arthritis with rheumatoid factor of multiple sites without organ or systems involvement: Secondary | ICD-10-CM | POA: Diagnosis not present

## 2017-08-14 DIAGNOSIS — M25562 Pain in left knee: Secondary | ICD-10-CM

## 2017-08-14 DIAGNOSIS — Z79899 Other long term (current) drug therapy: Secondary | ICD-10-CM

## 2017-08-14 DIAGNOSIS — M19031 Primary osteoarthritis, right wrist: Secondary | ICD-10-CM

## 2017-08-14 DIAGNOSIS — M5030 Other cervical disc degeneration, unspecified cervical region: Secondary | ICD-10-CM | POA: Diagnosis not present

## 2017-08-14 DIAGNOSIS — Z8719 Personal history of other diseases of the digestive system: Secondary | ICD-10-CM | POA: Diagnosis not present

## 2017-08-14 DIAGNOSIS — G8929 Other chronic pain: Secondary | ICD-10-CM

## 2017-08-14 DIAGNOSIS — M79641 Pain in right hand: Secondary | ICD-10-CM | POA: Diagnosis not present

## 2017-08-14 DIAGNOSIS — Z8639 Personal history of other endocrine, nutritional and metabolic disease: Secondary | ICD-10-CM

## 2017-08-14 DIAGNOSIS — M8589 Other specified disorders of bone density and structure, multiple sites: Secondary | ICD-10-CM | POA: Diagnosis not present

## 2017-08-14 DIAGNOSIS — M67911 Unspecified disorder of synovium and tendon, right shoulder: Secondary | ICD-10-CM

## 2017-08-14 DIAGNOSIS — M79671 Pain in right foot: Secondary | ICD-10-CM | POA: Diagnosis not present

## 2017-08-14 MED ORDER — PREDNISONE 5 MG PO TABS: ORAL_TABLET | ORAL | 0 refills | Status: DC

## 2017-08-14 MED ORDER — SHARPS CONTAINER MISC: 1.0000 | 6 refills | Status: DC | PRN

## 2017-08-14 MED ORDER — FOLIC ACID 1 MG PO TABS: 2.0000 mg | ORAL_TABLET | Freq: Every day | ORAL | 3 refills | Status: DC

## 2017-08-14 MED FILL — predniSONE 5 MG TABS: 5 | 28 days supply | Qty: 35 | Fill #0

## 2017-08-14 NOTE — Patient Instructions (Addendum)
Standing Labs We placed an order today for your standing lab work.    Please come back and get your standing labs in  2 Weeks 2 and then every 2 months  Please go to [facility] for chest x-ray, Radiology Department.   We have open lab Monday through Friday from 8:30-11:30 AM and 1:30-4 PM at the office of Dr. Bo Merino.   The office is located at [address] [place], [place], [place] [address] No appointment is necessary.   Labs are drawn by [facility].  You may receive a bill from [facility] for your lab work. If you have any questions regarding directions or hours of operation,  please call [phone].   Methotrexate injection What is this medicine? METHOTREXATE (METH oh TREX ate) is a chemotherapy drug used to treat cancer including breast cancer, leukemia, and lymphoma. This medicine can also be used to treat psoriasis and certain kinds of arthritis. This medicine may be used for other purposes; ask your health care provider or pharmacist if you have questions. What should I tell my health care provider before I take this medicine? They need to know if you have any of these conditions: -fluid in the stomach area or lungs -if you often drink alcohol -infection or immune system problems -kidney disease -liver disease -low blood counts, like low white cell, platelet, or red cell counts -lung disease -radiation therapy -stomach ulcers -ulcerative colitis -an unusual or allergic reaction to methotrexate, other medicines, foods, dyes, or preservatives -pregnant or trying to get pregnant -breast-feeding How should I use this medicine? This medicine is for infusion into a vein or for injection into muscle or into the spinal fluid (whichever applies). It is usually given by a health care professional in a hospital or clinic setting. In rare cases, you might get this medicine at home. You will be taught how to give this medicine. Use exactly as directed. Take your medicine at  regular intervals. Do not take your medicine more often than directed. If this medicine is used for arthritis or psoriasis, it should be taken weekly, NOT daily. It is important that you put your used needles and syringes in a special sharps container. Do not put them in a trash can. If you do not have a sharps container, call your pharmacist or healthcare provider to get one. Talk to your pediatrician regarding the use of this medicine in children. While this drug may be prescribed for children as young as 2 years for selected conditions, precautions do apply. Overdosage: If you think you have taken too much of this medicine contact a poison control center or emergency room at once. NOTE: This medicine is only for you. Do not share this medicine with others. What if I miss a dose? It is important not to miss your dose. Call your doctor or health care professional if you are unable to keep an appointment. If you give yourself the medicine and you miss a dose, talk with your doctor or health care professional. Do not take double or extra doses. What may interact with this medicine? This medicine may interact with the following medications: -acitretin -aspirin or aspirin-like medicines including salicylates -azathioprine -certain antibiotics like chloramphenicol, penicillin, tetracycline -certain medicines for stomach problems like esomeprazole, omeprazole, pantoprazole -cyclosporine -gold -hydroxychloroquine -live virus vaccines -mercaptopurine -NSAIDs, medicines for pain and inflammation, like ibuprofen or naproxen -other cytotoxic agents -penicillamine -phenylbutazone -phenytoin -probenacid -retinoids such as isotretinoin and tretinoin -steroid medicines like prednisone or cortisone -sulfonamides like sulfasalazine and  trimethoprim/sulfamethoxazole -theophylline This list may not describe all possible interactions. Give your health care provider a list of all the medicines, herbs,  non-prescription drugs, or dietary supplements you use. Also tell them if you smoke, drink alcohol, or use illegal drugs. Some items may interact with your medicine. What should I watch for while using this medicine? Avoid alcoholic drinks. In some cases, you may be given additional medicines to help with side effects. Follow all directions for their use. This medicine can make you more sensitive to the sun. Keep out of the sun. If you cannot avoid being in the sun, wear protective clothing and use sunscreen. Do not use sun lamps or tanning beds/booths. You may get drowsy or dizzy. Do not drive, use machinery, or do anything that needs mental alertness until you know how this medicine affects you. Do not stand or sit up quickly, especially if you are an older patient. This reduces the risk of dizzy or fainting spells. You may need blood work done while you are taking this medicine. Call your doctor or health care professional for advice if you get a fever, chills or sore throat, or other symptoms of a cold or flu. Do not treat yourself. This drug decreases your body's ability to fight infections. Try to avoid being around people who are sick. This medicine may increase your risk to bruise or bleed. Call your doctor or health care professional if you notice any unusual bleeding. Check with your doctor or health care professional if you get an attack of severe diarrhea, nausea and vomiting, or if you sweat a lot. The loss of too much body fluid can make it dangerous for you to take this medicine. Talk to your doctor about your risk of cancer. You may be more at risk for certain types of cancers if you take this medicine. Both men and women must use effective birth control with this medicine. Do not become pregnant while taking this medicine or until at least 1 normal menstrual cycle has occurred after stopping it. Women should inform their doctor if they wish to become pregnant or think they might be  pregnant. Men should not father a child while taking this medicine and for 3 months after stopping it. There is a potential for serious side effects to an unborn child. Talk to your health care professional or pharmacist for more information. Do not breast-feed an infant while taking this medicine. What side effects may I notice from receiving this medicine? Side effects that you should report to your doctor or health care professional as soon as possible: -allergic reactions like skin rash, itching or hives, swelling of the face, lips, or tongue -back pain -breathing problems or shortness of breath -confusion -diarrhea -dry, nonproductive cough -low blood counts - this medicine may decrease the number of white blood cells, red blood cells and platelets. You may be at increased risk of infections and bleeding -mouth sores -redness, blistering, peeling or loosening of the skin, including inside the mouth -seizures -severe headaches -signs of infection - fever or chills, cough, sore throat, pain or difficulty passing urine -signs and symptoms of bleeding such as bloody or black, tarry stools; red or dark-brown urine; spitting up blood or brown material that looks like coffee grounds; red spots on the skin; unusual bruising or bleeding from the eye, gums, or nose -signs and symptoms of kidney injury like trouble passing urine or change in the amount of urine -signs and symptoms of liver injury like dark  yellow or brown urine; general ill feeling or flu-like symptoms; light-colored stools; loss of appetite; nausea; right upper belly pain; unusually weak or tired; yellowing of the eyes or skin -stiff neck -vomiting Side effects that usually do not require medical attention (report to your doctor or health care professional if they continue or are bothersome): -dizziness -hair loss -headache -stomach pain -upset stomach This list may not describe all possible side effects. Call your doctor for  medical advice about side effects. You may report side effects to FDA at 1-800-FDA-1088. Where should I keep my medicine? If you are using this medicine at home, you will be instructed on how to store this medicine. Throw away any unused medicine after the expiration date on the label. NOTE: This sheet is a summary. It may not cover all possible information. If you have questions about this medicine, talk to your doctor, pharmacist, or health care provider.  2018 Elsevier/Gold Standard (2015-02-05 12:36:41)

## 2017-08-17 ENCOUNTER — Ambulatory Visit (HOSPITAL_COMMUNITY)
Admission: RE | Admit: 2017-08-17 | Discharge: 2017-08-17 | Disposition: A | Discharge status: Home / Self Care | Payer: 59 | Source: Ambulatory Visit | Attending: Rheumatology | Admitting: Rheumatology

## 2017-08-17 DIAGNOSIS — R079 Chest pain, unspecified: Secondary | ICD-10-CM | POA: Diagnosis not present

## 2017-08-17 DIAGNOSIS — M0579 Rheumatoid arthritis with rheumatoid factor of multiple sites without organ or systems involvement: Secondary | ICD-10-CM | POA: Diagnosis not present

## 2017-08-17 DIAGNOSIS — Z9225 Personal history of immunosupression therapy: Secondary | ICD-10-CM

## 2017-08-17 LAB — URINALYSIS, ROUTINE W REFLEX MICROSCOPIC
Bilirubin Urine: NEGATIVE
Glucose, UA: NEGATIVE
Hgb urine dipstick: NEGATIVE
Ketones, ur: NEGATIVE
Leukocytes, UA: NEGATIVE
Nitrite: NEGATIVE
Protein, ur: NEGATIVE
Specific Gravity, Urine: 1.006 (ref 1.001–1.03)
pH: 6.5 (ref 5.0–8.0)

## 2017-08-17 LAB — HEPATITIS PANEL, ACUTE
Hep A IgM: NONREACTIVE
Hep B C IgM: NONREACTIVE
Hepatitis B Surface Ag: NONREACTIVE
Hepatitis C Ab: NONREACTIVE
SIGNAL TO CUT-OFF: 0.01 (ref <1.00)

## 2017-08-17 LAB — IGG, IGA, IGM
IgG (Immunoglobin G), Serum: 1207 mg/dL (ref 694–1618)
IgM, Serum: 106 mg/dL (ref 48–271)
Immunoglobulin A: 227 mg/dL (ref 81–463)

## 2017-08-17 LAB — PROTEIN ELECTROPHORESIS, SERUM, WITH REFLEX
Albumin ELP: 4.5 g/dL (ref 3.8–4.8)
Alpha 1: 0.4 g/dL — ABNORMAL HIGH (ref 0.2–0.3)
Alpha 2: 0.9 g/dL (ref 0.5–0.9)
Beta 2: 0.4 g/dL (ref 0.2–0.5)
Beta Globulin: 0.5 g/dL (ref 0.4–0.6)
Gamma Globulin: 1.1 g/dL (ref 0.8–1.7)
Total Protein: 7.7 g/dL (ref 6.1–8.1)

## 2017-08-17 LAB — QUANTIFERON TB GOLD ASSAY (BLOOD)
Mitogen-Nil: 7.75 IU/mL
QUANTIFERON(R)-TB GOLD: NEGATIVE
Quantiferon Nil Value: 0.06 IU/mL
Quantiferon Tb Ag Minus Nil Value: 0.02 IU/mL

## 2017-08-17 LAB — ANTI-NUCLEAR AB-TITER (ANA TITER): ANA Titer 1: 1:80 {titer} — ABNORMAL HIGH

## 2017-08-17 LAB — ANA: Anti Nuclear Antibody(ANA): POSITIVE — AB

## 2017-08-17 LAB — HIV ANTIBODY (ROUTINE TESTING W REFLEX): HIV 1&2 Ab, 4th Generation: NONREACTIVE

## 2017-08-17 MED FILL — ALPRAZolam 0.25 MG TABS: 0.25 | 10 days supply | Qty: 10 | Fill #0

## 2017-08-17 NOTE — Progress Notes (Signed)
Labs are within normal limits. Okay to start on methotrexate.

## 2017-08-17 NOTE — Progress Notes (Signed)
Please notify patient and for the results to her PCP.

## 2017-08-18 ENCOUNTER — Telehealth: Payer: Self-pay

## 2017-08-18 NOTE — Telephone Encounter (Signed)
Patient was returning call to [facility].

## 2017-08-21 MED ORDER — "TUBERCULIN-ALLERGY SYRINGES 27G X 1/2"" 1 ML MISC": 3 refills | Status: DC

## 2017-08-21 MED ORDER — METHOTREXATE SODIUM CHEMO INJECTION 50 MG/2ML
INTRAMUSCULAR | 0 refills | Status: DC
Start: 2017-08-21 — End: 2018-01-04

## 2017-08-21 NOTE — Addendum Note (Signed)
Addended by: Carole Binning on: 08/21/2017 12:18 PM   Modules accepted: Orders

## 2017-08-21 NOTE — Telephone Encounter (Signed)
Patient advised of lab results and chest x-ray results. Prescription for MTX sent to the pharmacy.

## 2017-08-24 DIAGNOSIS — R9389 Abnormal findings on diagnostic imaging of other specified body structures: Secondary | ICD-10-CM | POA: Diagnosis not present

## 2017-08-24 DIAGNOSIS — M05771 Rheumatoid arthritis with rheumatoid factor of right ankle and foot without organ or systems involvement: Secondary | ICD-10-CM | POA: Diagnosis not present

## 2017-08-31 ENCOUNTER — Telehealth: Payer: Self-pay | Admitting: *Deleted

## 2017-08-31 NOTE — Telephone Encounter (Signed)
Zantac is a preferred drug  in combination with methotrexate. Protonix skin increased methotrexate levels. Although if she needs to take protonic she can and we can continue to monitor her labs.

## 2017-08-31 NOTE — Telephone Encounter (Signed)
Patient would like to know if she can take her Protonix while on MTX or what she should take?

## 2017-09-01 NOTE — Telephone Encounter (Signed)
Patient advised Zantac is preferred.

## 2017-09-04 ENCOUNTER — Other Ambulatory Visit: Payer: Self-pay | Admitting: Rheumatology

## 2017-09-04 ENCOUNTER — Other Ambulatory Visit: Payer: Self-pay

## 2017-09-04 DIAGNOSIS — Z79899 Other long term (current) drug therapy: Secondary | ICD-10-CM

## 2017-09-04 LAB — CBC WITH DIFFERENTIAL/PLATELET
Basophils Absolute: 74 cells/uL (ref 0–200)
Basophils Relative: 0.8 %
Eosinophils Absolute: 121 cells/uL (ref 15–500)
Eosinophils Relative: 1.3 %
HCT: 40.1 % (ref 35.0–45.0)
Hemoglobin: 13.8 g/dL (ref 11.7–15.5)
Lymphs Abs: 2837 cells/uL (ref 850–3900)
MCH: 30.5 pg (ref 27.0–33.0)
MCHC: 34.4 g/dL (ref 32.0–36.0)
MCV: 88.5 fL (ref 80.0–100.0)
MPV: 9.9 fL (ref 7.5–12.5)
Monocytes Relative: 6.2 %
Neutro Abs: 5692 cells/uL (ref 1500–7800)
Neutrophils Relative %: 61.2 %
Platelets: 281 10*3/uL (ref 140–400)
RBC: 4.53 10*6/uL (ref 3.80–5.10)
RDW: 13.2 % (ref 11.0–15.0)
Total Lymphocyte: 30.5 %
WBC mixed population: 577 cells/uL (ref 200–950)
WBC: 9.3 10*3/uL (ref 3.8–10.8)

## 2017-09-04 LAB — COMPLETE METABOLIC PANEL WITH GFR
AG Ratio: 1.6 (calc) (ref 1.0–2.5)
ALT: 6 U/L (ref 6–29)
AST: 15 U/L (ref 10–35)
Albumin: 4.1 g/dL (ref 3.6–5.1)
Alkaline phosphatase (APISO): 68 U/L (ref 33–130)
BUN: 19 mg/dL (ref 7–25)
CO2: 32 mmol/L (ref 20–32)
Calcium: 9.5 mg/dL (ref 8.6–10.4)
Chloride: 101 mmol/L (ref 98–110)
Creat: 0.94 mg/dL (ref 0.50–1.05)
GFR, Est African American: 79 mL/min/{1.73_m2} (ref >=60)
GFR, Est Non African American: 68 mL/min/{1.73_m2} (ref >=60)
Globulin: 2.5 g/dL (calc) (ref 1.9–3.7)
Glucose, Bld: 96 mg/dL (ref 65–99)
Potassium: 4.7 mmol/L (ref 3.5–5.3)
Sodium: 139 mmol/L (ref 135–146)
Total Bilirubin: 0.4 mg/dL (ref 0.2–1.2)
Total Protein: 6.6 g/dL (ref 6.1–8.1)

## 2017-09-05 NOTE — Progress Notes (Signed)
wnl

## 2017-09-07 ENCOUNTER — Telehealth: Payer: Self-pay

## 2017-09-07 NOTE — Telephone Encounter (Signed)
Patient advised to decrease her MTX to 0.6 mL. Patient verbalized understanding.

## 2017-09-07 NOTE — Telephone Encounter (Signed)
Patient called concerning symptoms that she is having from taking the MTX when increasing the dose.  Stated that she has been having tightness in her chest that started yesterday, head feeling very heavy, dizziness yesterday, and very nauseous.  Cb# is [phone].  Please advise.  Thank you.

## 2017-09-07 NOTE — Telephone Encounter (Signed)
Ok to reduce dose of MTX to 0.6 ml sq q wk

## 2017-09-07 NOTE — Telephone Encounter (Signed)
Patient states she increased her MTX to 0.8 mL yesterday. Patient states after taking the increased dose t she has been having tightness in her chest that started yesterday, head feeling very heavy, dizziness yesterday, and very nauseous. Patient states she did experience these symptoms on the lower dose. Patient states the tightness in her chest comes and goes. Patient states she gets a little out of breath with activity. Patient states her children have recently been sick and one has been placed on antibiotics.

## 2017-09-08 DIAGNOSIS — E785 Hyperlipidemia, unspecified: Secondary | ICD-10-CM | POA: Insufficient documentation

## 2017-09-08 DIAGNOSIS — M19072 Primary osteoarthritis, left ankle and foot: Secondary | ICD-10-CM

## 2017-09-08 DIAGNOSIS — M170 Bilateral primary osteoarthritis of knee: Secondary | ICD-10-CM | POA: Insufficient documentation

## 2017-09-08 DIAGNOSIS — M19042 Primary osteoarthritis, left hand: Secondary | ICD-10-CM

## 2017-09-08 DIAGNOSIS — M8589 Other specified disorders of bone density and structure, multiple sites: Secondary | ICD-10-CM | POA: Insufficient documentation

## 2017-09-08 DIAGNOSIS — Z8719 Personal history of other diseases of the digestive system: Secondary | ICD-10-CM | POA: Insufficient documentation

## 2017-09-08 DIAGNOSIS — M19071 Primary osteoarthritis, right ankle and foot: Secondary | ICD-10-CM | POA: Insufficient documentation

## 2017-09-08 DIAGNOSIS — M19041 Primary osteoarthritis, right hand: Secondary | ICD-10-CM | POA: Insufficient documentation

## 2017-09-08 NOTE — Progress Notes (Signed)
Office Visit Note  Patient: Gina Mitchell             Date of Birth: [birth year]           MRN: [id number]             PCP: Shirline Frees, MD Referring: Shirline Frees, MD Visit Date: 09/12/2017 Occupation: @GUAROCC @    Subjective:  Other (doing good overall, right leg/ankle pain, wants to discuss side effects of MTX)   History of Present Illness: Gina Mitchell is a 56 y.o. female with history of sero positive rheumatoid arthritis. She's been on methotrexate for 3 weeks now. She finished prednisone taper last Sunday. She did well on methotrexate 0.6 mL subcutaneous every week. She states when she increased methotrexate 0.8 ML subcutaneous every week she started noticing some nausea and headaches.she was concerned about the side effects of protonic sent discontinued Posey Pronto next. She believes that's causing more of her GI symptoms.she is having pain and swelling in her right ankle.she also has discomfort in her bilateral hands especially in her thumbs. She describes pain in her left foot MTPs.  Activities of Daily Living:  Patient reports morning stiffness for 0 minute.   Patient Denies nocturnal pain.  Difficulty dressing/grooming: Denies Difficulty climbing stairs: Denies Difficulty getting out of chair: Denies Difficulty using hands for taps, buttons, cutlery, and/or writing: Denies   Review of Systems  Constitutional: Positive for fatigue. Negative for night sweats, weight gain, weight loss and weakness.  HENT: Negative for mouth sores, trouble swallowing, trouble swallowing, mouth dryness and nose dryness.   Eyes: Positive for dryness. Negative for pain, redness and visual disturbance.  Respiratory: Negative for cough, shortness of breath and difficulty breathing.   Cardiovascular: Negative for chest pain, palpitations, hypertension, irregular heartbeat and swelling in legs/feet.  Gastrointestinal: Negative for blood in stool, constipation and diarrhea.  Endocrine: Negative  for increased urination.  Genitourinary: Negative for vaginal dryness.  Musculoskeletal: Positive for arthralgias, joint pain, joint swelling and morning stiffness. Negative for myalgias, muscle weakness, muscle tenderness and myalgias.  Skin: Negative for color change, rash, hair loss, skin tightness, ulcers and sensitivity to sunlight.  Allergic/Immunologic: Negative for susceptible to infections.  Neurological: Negative for dizziness, memory loss and night sweats.  Hematological: Negative for swollen glands.  Psychiatric/Behavioral: Negative for depressed mood and sleep disturbance. The patient is not nervous/anxious.     PMFS History:  Patient Active Problem List   Diagnosis Date Noted  . Primary osteoarthritis of both hands 09/08/2017  . Primary osteoarthritis of both feet 09/08/2017  . Primary osteoarthritis of both knees 09/08/2017  . History of gastroesophageal reflux (GERD) 09/08/2017  . Dyslipidemia 09/08/2017  . Osteopenia of multiple sites 09/08/2017  . Right ankle swelling 06/26/2017  . Rheumatoid arthritis ([place]) 06/26/2017  . DDD (degenerative disc disease), cervical 09/13/2016  . Localized primary carpometacarpal osteoarthritis, right 08/26/2016  . Rotator cuff dysfunction, right 08/26/2016  . Metatarsalgia 08/01/2016  . Metatarsal stress fracture of left foot 04/19/2016  . Abnormal EKG 11/06/2014  . Chest pain 06/10/2014  . Plantar fasciitis, bilateral 11/25/2013    Past Medical History:  Diagnosis Date  . Allergic rhinitis   . Chest pain radiating to jaw   . Esophageal reflux   . Incomplete right bundle branch block   . Situational anxiety     Family History  Problem Relation Age of Onset  . Prostate cancer Father   . Arrhythmia Father   . Breast cancer Sister   .  Hypertension Mother    Past Surgical History:  Procedure Laterality Date  . Breast mass removal     Social History   Social History Narrative  . Not on file     Objective: Vital  Signs: BP 107/78 (BP Location: Left Arm, Patient Position: Sitting, Cuff Size: Normal)   Pulse 69   Resp 15   Ht 5\' 2"  (1.575 m)   Wt 122 lb (55.3 kg)   BMI 22.31 kg/m    Physical Exam  Constitutional: She is oriented to person, place, and time. She appears well-developed and well-nourished.  HENT:  Head: Normocephalic and atraumatic.  Eyes: Conjunctivae and EOM are normal.  Neck: Normal range of motion.  Cardiovascular: Normal rate, regular rhythm, normal heart sounds and intact distal pulses.  Pulmonary/Chest: Effort normal and breath sounds normal.  Abdominal: Soft. Bowel sounds are normal.  Lymphadenopathy:    She has no cervical adenopathy.  Neurological: She is alert and oriented to person, place, and time.  Skin: Skin is warm and dry. Capillary refill takes less than 2 seconds.  Psychiatric: She has a normal mood and affect. Her behavior is normal.  Nursing note and vitals reviewed.    Musculoskeletal Exam: C-spine and thoracic lumbar spine good range of motion. Shoulder joints elbow joints wrist joint MCPs PIPs DIPs with good range of motion. She had no synovitis over her wrist joint and MCP joints. She is tenderness over bilateral CMC's and some of her DIP joints. Hip joints knee joints are good range of motion. She is some synovitis over right ankle joint. No tenderness was noted over MTP joints.  CDAI Exam: CDAI Homunculus Exam:   Tenderness:  RUE: carpometacarpal LUE: carpometacarpal Left hand: 5th DIP RLE: tibiotalar  Swelling:  RLE: tibiotalar  Joint Counts:  CDAI Tender Joint count: 0 CDAI Swollen Joint count: 0  Global Assessments:  Patient Global Assessment: 4 Provider Global Assessment: 4  CDAI Calculated Score: 8    Investigation: No additional findings. CBC    Component Value Date/Time   WBC 9.3 09/04/2017 1238   RBC 4.53 09/04/2017 1238   HGB 13.8 09/04/2017 1238   HCT 40.1 09/04/2017 1238   PLT 281 09/04/2017 1238   MCV 88.5  09/04/2017 1238   MCH 30.5 09/04/2017 1238   MCHC 34.4 09/04/2017 1238   RDW 13.2 09/04/2017 1238   LYMPHSABS 2,837 09/04/2017 1238   MONOABS 728 06/26/2017 1123   EOSABS 121 09/04/2017 1238   BASOSABS 74 09/04/2017 1238   CMP     Component Value Date/Time   NA 139 09/04/2017 1238   K 4.7 09/04/2017 1238   CL 101 09/04/2017 1238   CO2 32 09/04/2017 1238   GLUCOSE 96 09/04/2017 1238   BUN 19 09/04/2017 1238   CREATININE 0.94 09/04/2017 1238   CALCIUM 9.5 09/04/2017 1238   PROT 6.6 09/04/2017 1238   ALBUMIN 4.1 06/26/2017 1123   AST 15 09/04/2017 1238   ALT 6 09/04/2017 1238   ALKPHOS 79 06/26/2017 1123   BILITOT 0.4 09/04/2017 1238   GFRNONAA 68 09/04/2017 1238   GFRAA 79 09/04/2017 1238  ANA 1:80NH HIV negative, TB negative, hepatitis panel negative, SPEP negative, immunoglobulins normal  Imaging: Dg Chest 2 View  Result Date: 08/17/2017 CLINICAL DATA:  Acid reflux, rheumatoid arthritis, beginning immunosuppressive therapy EXAM: CHEST  2 VIEW COMPARISON:  None FINDINGS: Minimal enlargement of cardiac silhouette. Mediastinal contours and pulmonary vascularity normal. Lungs hyperinflated but clear. No pulmonary infiltrate, pleural effusion or pneumothorax. Pectus deformity.  No acute osseous findings. IMPRESSION: Minimal enlargement of cardiac silhouette and pulmonary hyperinflation without acute abnormalities. Electronically Signed   By: Lavonia Dana M.D.   On: 08/17/2017 15:44   Xr Foot 2 Views Left  Result Date: 08/14/2017 PIP/DIP narrowing was noted. No MTP joint narrowing or erosive changes were noted. No intertarsal joint space narrowing was noted. A small calcaneal spur was noted.  Xr Foot 2 Views Right  Result Date: 08/14/2017 PIP/DIP narrowing was noted. No MTP joint narrowing or erosive changes were noted. No intertarsal joint space narrowing was noted. A small calcaneal spur was noted.  Xr Hand 2 View Left  Result Date: 08/14/2017 Juxta articular osteopenia  noted. Mild PIP narrowing was noted.No intercarpal or radiocarpal joint space narrowing was noted. No erosive changes were noted.  Xr Hand 2 View Right  Result Date: 08/14/2017 Juxta articular osteopenia noted no significant MCP joint space narrowing noted. Mild PIP joint narrowing noted. No intercarpal joint space or radiocarpal joint space narrowing was noted. No erosive changes were noted.  Xr Knee 3 View Left  Result Date: 08/14/2017 Mild medial compartment narrowing was noted. No chondrocalcinosis was noted. Mild patellofemoral narrowing was noted. Impression:These findings are consistent with mild osteoarthritis of the knee joint and mild chondromalacia patella  Xr Knee 3 View Right  Result Date: 08/14/2017 Mild medial compartment narrowing was noted. No chondrocalcinosis was noted. Mild patellofemoral narrowing was noted. Impression:These findings are consistent with mild osteoarthritis of the knee joint and mild chondromalacia patella   Speciality Comments: No specialty comments available.    Procedures:  No procedures performed Allergies: Crab [shellfish allergy]   Assessment / Plan:     Visit Diagnoses: Rheumatoid arthritis involving multiple sites with positive rheumatoid factor (HCC) - Positive anti-CCP, history of positive synovitis. She is doing better on methotrexate. She is taking only 3 doses of methotrexate so far. An she's finished her prednisone taper. She continues to have some pain and swelling in her right ankle joint. She states she did really well on 0.6 ML off methotrexate but it 0.8 ML she had some GI symptoms and possible chest tightness. She supposed to take her next methotrexate dose today. I will add Zofran 4 mg by mouth every 6 hours when necessary nausea. She also stopped her tonics have advised her to resume protonix. We will monitor her lab work closely. If she has some increased chest tightness or any pulmonary symptoms she supposed to discontinue  methotrexate. I also discussed an option of starting her on Enbrel if she has inadequate response or side effects from methotrexate.handout on Enbrel was given. Patient will notify me if she could not tolerate methotrexate or if she has inadequate response.  High risk medication use - Methotrexate0.8 ml up to every week, folic acid 2 mg by mouth daily. Her labs are stable.  Primary osteoarthritis of both hands: She continues to have some CMC and DIP pain.  Primary osteoarthritis of both feet: She is stiffness in her feet but no synovitis.  Primary osteoarthritis of both knees - mild  DDD (degenerative disc disease), cervical: Currently she is not having much discomfort.  History of gastroesophageal reflux (GERD): Reflux symptoms are worse after getting off Protonix. She will resume her medication.  Dyslipidemia  Osteopenia of multiple sites : She is on calcium and vitamin D.   Orders: No orders of the defined types were placed in this encounter.  No orders of the defined types were placed in this encounter.   Face-to-face  time spent with patient was 30 minutes.greater than 50% of time was spent in counseling and coordination of care.  Follow-Up Instructions: Return in about 3 months (around 12/13/2017) for Rheumatoid arthritis, Osteoarthritis.   Bo Merino, MD  Note - This record has been created using Editor, commissioning.  Chart creation errors have been sought, but may not always  have been located. Such creation errors do not reflect on  the standard of medical care.

## 2017-09-12 ENCOUNTER — Encounter: Payer: Self-pay | Admitting: Rheumatology

## 2017-09-12 ENCOUNTER — Other Ambulatory Visit: Payer: Self-pay

## 2017-09-12 ENCOUNTER — Ambulatory Visit: Payer: 59 | Admitting: Rheumatology

## 2017-09-12 VITALS — BP 107/78 | HR 69 | Resp 15 | Ht 62.0 in | Wt 122.0 lb

## 2017-09-12 DIAGNOSIS — M19071 Primary osteoarthritis, right ankle and foot: Secondary | ICD-10-CM | POA: Diagnosis not present

## 2017-09-12 DIAGNOSIS — E785 Hyperlipidemia, unspecified: Secondary | ICD-10-CM

## 2017-09-12 DIAGNOSIS — M19072 Primary osteoarthritis, left ankle and foot: Secondary | ICD-10-CM | POA: Diagnosis not present

## 2017-09-12 DIAGNOSIS — M0579 Rheumatoid arthritis with rheumatoid factor of multiple sites without organ or systems involvement: Secondary | ICD-10-CM

## 2017-09-12 DIAGNOSIS — Z8719 Personal history of other diseases of the digestive system: Secondary | ICD-10-CM

## 2017-09-12 DIAGNOSIS — M8589 Other specified disorders of bone density and structure, multiple sites: Secondary | ICD-10-CM | POA: Diagnosis not present

## 2017-09-12 DIAGNOSIS — M170 Bilateral primary osteoarthritis of knee: Secondary | ICD-10-CM | POA: Diagnosis not present

## 2017-09-12 DIAGNOSIS — M19042 Primary osteoarthritis, left hand: Secondary | ICD-10-CM | POA: Diagnosis not present

## 2017-09-12 DIAGNOSIS — Z79899 Other long term (current) drug therapy: Secondary | ICD-10-CM | POA: Diagnosis not present

## 2017-09-12 DIAGNOSIS — M19041 Primary osteoarthritis, right hand: Secondary | ICD-10-CM

## 2017-09-12 DIAGNOSIS — M5030 Other cervical disc degeneration, unspecified cervical region: Secondary | ICD-10-CM

## 2017-09-12 MED ORDER — ONDANSETRON HCL 4 MG PO TABS: 4.0000 mg | ORAL_TABLET | Freq: Four times a day (QID) | ORAL | 2 refills | Status: DC | PRN

## 2017-09-12 MED FILL — ONDANSETRON HCL 4 MG TABLET: 4 | 8 days supply | Qty: 30 | Fill #0

## 2017-09-12 NOTE — Patient Instructions (Addendum)
Standing Labs We placed an order today for your standing lab work.    Please come back and get your standing labs in  1 week, February and every 3 months  We have open lab Monday through Friday from 8:30-11:30 AM and 1:30-4 PM at the office of Dr. Bo Merino.   The office is located at [address], [place], [place], [place] [address] No appointment is necessary.   Labs are drawn by [facility].  You may receive a bill from [facility] for your lab work. If you have any questions regarding directions or hours of operation,  please call [phone].    Etanercept injection What is this medicine? ETANERCEPT (et a [employer]) is used for the treatment of rheumatoid arthritis in adults and children. The medicine is also used to treat psoriatic arthritis, ankylosing spondylitis, and psoriasis. This medicine may be used for other purposes; ask your health care provider or pharmacist if you have questions. COMMON BRAND NAME(S): Enbrel What should I tell my health care provider before I take this medicine? They need to know if you have any of these conditions: -blood disorders -cancer -congestive heart failure -diabetes -exposure to chickenpox -immune system problems -infection -multiple sclerosis -seizure disorder -tuberculosis, a positive skin test for tuberculosis or have recently been in close contact with someone who has tuberculosis -Wegener's granulomatosis -an unusual or allergic reaction to etanercept, latex, other medicines, foods, dyes, or preservatives -pregnant or trying to get pregnant -breast-feeding How should I use this medicine? The medicine is given by injection under the skin. You will be taught how to prepare and give this medicine. Use exactly as directed. Take your medicine at regular intervals. Do not take your medicine more often than directed. It is important that you put your used needles and syringes in a special sharps container. Do not put them in a trash can.  If you do not have a sharps container, call your pharmacist or healthcare provider to get one. A special MedGuide will be given to you by the pharmacist with each prescription and refill. Be sure to read this information carefully each time. Talk to your pediatrician regarding the use of this medicine in children. While this drug may be prescribed for children as young as 21 years of age for selected conditions, precautions do apply. Overdosage: If you think you have taken too much of this medicine contact a poison control center or emergency room at once. NOTE: This medicine is only for you. Do not share this medicine with others. What if I miss a dose? If you miss a dose, contact your health care professional to find out when you should take your next dose. Do not take double or extra doses without advice. What may interact with this medicine? Do not take this medicine with any of the following medications: -anakinra This medicine may also interact with the following medications: -cyclophosphamide -sulfasalazine -vaccines This list may not describe all possible interactions. Give your health care provider a list of all the medicines, herbs, non-prescription drugs, or dietary supplements you use. Also tell them if you smoke, drink alcohol, or use illegal drugs. Some items may interact with your medicine. What should I watch for while using this medicine? Tell your doctor or healthcare professional if your symptoms do not start to get better or if they get worse. You will be tested for tuberculosis (TB) before you start this medicine. If your doctor prescribes any medicine for TB, you should start taking the TB medicine  before starting this medicine. Make sure to finish the full course of TB medicine. Call your doctor or health care professional for advice if you get a fever, chills or sore throat, or other symptoms of a cold or flu. Do not treat yourself. This drug decreases your body's ability to  fight infections. Try to avoid being around people who are sick. What side effects may I notice from receiving this medicine? Side effects that you should report to your doctor or health care professional as soon as possible: -allergic reactions like skin rash, itching or hives, swelling of the face, lips, or tongue -changes in vision -fever, chills or any other sign of infection -numbness or tingling in legs or other parts of the body -red, scaly patches or raised bumps on the skin -shortness of breath or difficulty breathing -swollen lymph nodes in the neck, underarm, or groin areas -unexplained weight loss -unusual bleeding or bruising -unusual swelling or fluid retention in the legs -unusually weak or tired Side effects that usually do not require medical attention (report to your doctor or health care professional if they continue or are bothersome): -dizziness -headache -nausea -redness, itching, or swelling at the injection site -vomiting This list may not describe all possible side effects. Call your doctor for medical advice about side effects. You may report side effects to FDA at 1-800-FDA-1088. Where should I keep my medicine? Keep out of the reach of children. Store between 2 and 8 degrees C (36 and 46 degrees F). Do not freeze or shake. Protect from light. Throw away any unused medicine after the expiration date. You will be instructed on how to store this medicine. NOTE: This sheet is a summary. It may not cover all possible information. If you have questions about this medicine, talk to your doctor, pharmacist, or health care provider.  2018 Elsevier/Gold Standard (2012-04-23 15:33:36)    Natural anti-inflammatories  You can purchase these at [facility], [employer] or online.  . Turmeric (capsules)  . Ginger (ginger root or capsules)  . Omega 3 (Fish, flax seeds, chia seeds, walnuts, almonds)  . Tart cherry (dried or extract)   Patient should be under  the care of a physician while taking these supplements. This may not be reproduced without the permission of Dr. Bo Merino.

## 2017-09-18 ENCOUNTER — Other Ambulatory Visit: Payer: Self-pay

## 2017-09-18 ENCOUNTER — Telehealth: Payer: Self-pay | Admitting: Rheumatology

## 2017-09-18 DIAGNOSIS — Z79899 Other long term (current) drug therapy: Secondary | ICD-10-CM | POA: Diagnosis not present

## 2017-09-18 DIAGNOSIS — H04123 Dry eye syndrome of bilateral lacrimal glands: Secondary | ICD-10-CM | POA: Diagnosis not present

## 2017-09-18 NOTE — Telephone Encounter (Signed)
Patient advised that her next labs are in February 2019. Patient verbalized understanding.

## 2017-09-18 NOTE — Telephone Encounter (Signed)
Patient would like to find out when next lab draw is to be done. Patient had labs today. Please call to advise.

## 2017-09-19 DIAGNOSIS — R0789 Other chest pain: Secondary | ICD-10-CM | POA: Diagnosis not present

## 2017-09-19 DIAGNOSIS — Z79899 Other long term (current) drug therapy: Secondary | ICD-10-CM | POA: Diagnosis not present

## 2017-09-19 DIAGNOSIS — E559 Vitamin D deficiency, unspecified: Secondary | ICD-10-CM | POA: Diagnosis not present

## 2017-09-19 DIAGNOSIS — Z0000 Encounter for general adult medical examination without abnormal findings: Secondary | ICD-10-CM | POA: Diagnosis not present

## 2017-09-19 DIAGNOSIS — M06042 Rheumatoid arthritis without rheumatoid factor, left hand: Secondary | ICD-10-CM | POA: Diagnosis not present

## 2017-09-19 DIAGNOSIS — M06041 Rheumatoid arthritis without rheumatoid factor, right hand: Secondary | ICD-10-CM | POA: Diagnosis not present

## 2017-09-19 DIAGNOSIS — Z1211 Encounter for screening for malignant neoplasm of colon: Secondary | ICD-10-CM | POA: Diagnosis not present

## 2017-09-19 DIAGNOSIS — K219 Gastro-esophageal reflux disease without esophagitis: Secondary | ICD-10-CM | POA: Diagnosis not present

## 2017-09-19 DIAGNOSIS — R1084 Generalized abdominal pain: Secondary | ICD-10-CM | POA: Diagnosis not present

## 2017-09-19 DIAGNOSIS — E7800 Pure hypercholesterolemia, unspecified: Secondary | ICD-10-CM | POA: Diagnosis not present

## 2017-09-19 LAB — COMPLETE METABOLIC PANEL WITH GFR
AG Ratio: 1.6 (calc) (ref 1.0–2.5)
ALT: 6 U/L (ref 6–29)
AST: 16 U/L (ref 10–35)
Albumin: 4.1 g/dL (ref 3.6–5.1)
Alkaline phosphatase (APISO): 64 U/L (ref 33–130)
BUN: 13 mg/dL (ref 7–25)
CO2: 31 mmol/L (ref 20–32)
Calcium: 9.4 mg/dL (ref 8.6–10.4)
Chloride: 103 mmol/L (ref 98–110)
Creat: 0.86 mg/dL (ref 0.50–1.05)
GFR, Est African American: 88 mL/min/{1.73_m2} (ref >=60)
GFR, Est Non African American: 76 mL/min/{1.73_m2} (ref >=60)
Globulin: 2.5 g/dL (calc) (ref 1.9–3.7)
Glucose, Bld: 101 mg/dL — ABNORMAL HIGH (ref 65–99)
Potassium: 4.3 mmol/L (ref 3.5–5.3)
Sodium: 142 mmol/L (ref 135–146)
Total Bilirubin: 0.4 mg/dL (ref 0.2–1.2)
Total Protein: 6.6 g/dL (ref 6.1–8.1)

## 2017-09-19 LAB — CBC WITH DIFFERENTIAL/PLATELET
Basophils Absolute: 72 cells/uL (ref 0–200)
Basophils Relative: 0.9 %
Eosinophils Absolute: 136 cells/uL (ref 15–500)
Eosinophils Relative: 1.7 %
HCT: 37.3 % (ref 35.0–45.0)
Hemoglobin: 12.9 g/dL (ref 11.7–15.5)
Lymphs Abs: 2288 cells/uL (ref 850–3900)
MCH: 30.6 pg (ref 27.0–33.0)
MCHC: 34.6 g/dL (ref 32.0–36.0)
MCV: 88.6 fL (ref 80.0–100.0)
MPV: 9.6 fL (ref 7.5–12.5)
Monocytes Relative: 4.7 %
Neutro Abs: 5128 cells/uL (ref 1500–7800)
Neutrophils Relative %: 64.1 %
Platelets: 244 10*3/uL (ref 140–400)
RBC: 4.21 10*6/uL (ref 3.80–5.10)
RDW: 13.2 % (ref 11.0–15.0)
Total Lymphocyte: 28.6 %
WBC mixed population: 376 cells/uL (ref 200–950)
WBC: 8 10*3/uL (ref 3.8–10.8)

## 2017-09-25 ENCOUNTER — Telehealth (INDEPENDENT_AMBULATORY_CARE_PROVIDER_SITE_OTHER): Payer: Self-pay

## 2017-09-25 NOTE — Telephone Encounter (Signed)
Patient called stating that she is having pain in the ball of her left foot.  Stated that she is not sure if the pain is related to her RA or not.  Would like to know if she needs to see Dr. Estanislado Pandy or a foot specialists.  Cb# is [phone].  Please advise.  Thank you.

## 2017-09-28 NOTE — Telephone Encounter (Signed)
Attempted to contact the patient. No answer and unable to leave a message.

## 2017-10-02 ENCOUNTER — Encounter: Payer: Self-pay | Admitting: Rheumatology

## 2017-10-02 ENCOUNTER — Ambulatory Visit: Payer: 59 | Admitting: Rheumatology

## 2017-10-02 ENCOUNTER — Ambulatory Visit (INDEPENDENT_AMBULATORY_CARE_PROVIDER_SITE_OTHER): Payer: Self-pay

## 2017-10-02 VITALS — BP 114/86 | HR 63 | Resp 12 | Wt 123.0 lb

## 2017-10-02 DIAGNOSIS — M8589 Other specified disorders of bone density and structure, multiple sites: Secondary | ICD-10-CM

## 2017-10-02 DIAGNOSIS — Z79899 Other long term (current) drug therapy: Secondary | ICD-10-CM

## 2017-10-02 DIAGNOSIS — M79672 Pain in left foot: Secondary | ICD-10-CM | POA: Diagnosis not present

## 2017-10-02 DIAGNOSIS — M0579 Rheumatoid arthritis with rheumatoid factor of multiple sites without organ or systems involvement: Secondary | ICD-10-CM

## 2017-10-02 DIAGNOSIS — M19071 Primary osteoarthritis, right ankle and foot: Secondary | ICD-10-CM

## 2017-10-02 DIAGNOSIS — M19042 Primary osteoarthritis, left hand: Secondary | ICD-10-CM

## 2017-10-02 DIAGNOSIS — M170 Bilateral primary osteoarthritis of knee: Secondary | ICD-10-CM | POA: Diagnosis not present

## 2017-10-02 DIAGNOSIS — M5030 Other cervical disc degeneration, unspecified cervical region: Secondary | ICD-10-CM | POA: Diagnosis not present

## 2017-10-02 DIAGNOSIS — M19072 Primary osteoarthritis, left ankle and foot: Secondary | ICD-10-CM | POA: Diagnosis not present

## 2017-10-02 DIAGNOSIS — Z8719 Personal history of other diseases of the digestive system: Secondary | ICD-10-CM

## 2017-10-02 DIAGNOSIS — M19041 Primary osteoarthritis, right hand: Secondary | ICD-10-CM | POA: Diagnosis not present

## 2017-10-02 DIAGNOSIS — E785 Hyperlipidemia, unspecified: Secondary | ICD-10-CM

## 2017-10-02 MED ORDER — DICLOFENAC SODIUM 1 % TD GEL: TRANSDERMAL | 0 refills | Status: DC

## 2017-10-02 NOTE — Patient Instructions (Signed)
Standing Labs We placed an order today for your standing lab work.    Please come back and get your standing labs in February and every 3 months  We have open lab Monday through Friday from 8:30-11:30 AM and 1:30-4 PM at the office of Dr. Lamari Youngers.   The office is located at [address], [address], [place], [place] [address] No appointment is necessary.   Labs are drawn by [facility].  You may receive a bill from [facility] for your lab work. If you have any questions regarding directions or hours of operation,  please call [phone].    

## 2017-10-02 NOTE — Progress Notes (Signed)
Office Visit Note  Patient: Gina Mitchell             Date of Birth: 04/18/1961           MRN: [id number]             PCP: Shirline Frees, MD Referring: Shirline Frees, MD Visit Date: 10/02/2017 Occupation: @GUAROCC @    Subjective:  Pain of the Left Foot (Pain in the ball of the foot, Patient states she has felt a not and burns when she walks. Patient concerned becuse she has a trip planned to [employer] in 1 week which will require a lot of walking. )   History of Present Illness: Gina Mitchell is a 56 y.o. female with history of sero positive rheumatoid arthritis. She had been doing well on methotrexate 0.8 ML subcutaneously per week. She states for the last few weeks she's been having increased pain and discomfort in her left foot between the third at the fourth toe. She states she has discomfort on putting pressure on her foot and also while she is resting. She describes burning sensation in that area. None of the other joints are painful or swollen. Patient reports that she had fractured her left foot metatarsal last year and required cast.  Activities of Daily Living:  Patient reports morning stiffness for0  minute.   Patient Denies nocturnal pain.  Difficulty dressing/grooming: Denies Difficulty climbing stairs: Denies Difficulty getting out of chair: Denies Difficulty using hands for taps, buttons, cutlery, and/or writing: Denies   Review of Systems  Constitutional: Positive for fatigue. Negative for night sweats, weight gain, weight loss and weakness.  HENT: Positive for mouth dryness. Negative for mouth sores, trouble swallowing, trouble swallowing and nose dryness.   Eyes: Positive for dryness. Negative for pain, redness and visual disturbance.  Respiratory: Negative for cough, shortness of breath and difficulty breathing.   Cardiovascular: Negative for chest pain, palpitations, hypertension, irregular heartbeat and swelling in legs/feet.  Gastrointestinal:  Negative for blood in stool, constipation and diarrhea.  Endocrine: Negative for increased urination.  Genitourinary: Negative for vaginal dryness.  Musculoskeletal: Positive for arthralgias and joint pain. Negative for joint swelling, myalgias, muscle weakness, morning stiffness, muscle tenderness and myalgias.  Skin: Negative for color change, rash, hair loss, skin tightness, ulcers and sensitivity to sunlight.  Allergic/Immunologic: Negative for susceptible to infections.  Neurological: Negative for dizziness, memory loss and night sweats.  Hematological: Negative for swollen glands.  Psychiatric/Behavioral: Negative for depressed mood and sleep disturbance. The patient is not nervous/anxious.     PMFS History:  Patient Active Problem List   Diagnosis Date Noted  . Primary osteoarthritis of both hands 09/08/2017  . Primary osteoarthritis of both feet 09/08/2017  . Primary osteoarthritis of both knees 09/08/2017  . History of gastroesophageal reflux (GERD) 09/08/2017  . Dyslipidemia 09/08/2017  . Osteopenia of multiple sites 09/08/2017  . Right ankle swelling 06/26/2017  . Rheumatoid arthritis ([facility]) 06/26/2017  . DDD (degenerative disc disease), cervical 09/13/2016  . Localized primary carpometacarpal osteoarthritis, right 08/26/2016  . Rotator cuff dysfunction, right 08/26/2016  . Metatarsalgia 08/01/2016  . Metatarsal stress fracture of left foot 04/19/2016  . Abnormal EKG 11/06/2014  . Chest pain 06/10/2014  . Plantar fasciitis, bilateral 11/25/2013    Past Medical History:  Diagnosis Date  . Allergic rhinitis   . Chest pain radiating to jaw   . Esophageal reflux   . Incomplete right bundle branch block   . Situational anxiety  Family History  Problem Relation Age of Onset  . Prostate cancer Father   . Arrhythmia Father   . Breast cancer Sister   . Hypertension Mother    Past Surgical History:  Procedure Laterality Date  . Breast mass removal     Social  History   Social History Narrative  . Not on file     Objective: Vital Signs: BP 114/86 (BP Location: Left Arm, Patient Position: Sitting, Cuff Size: Normal)   Pulse 63   Resp 12   Wt 123 lb (55.8 kg)   BMI 22.50 kg/m    Physical Exam  Constitutional: She is oriented to person, place, and time. She appears well-developed and well-nourished.  HENT:  Head: Normocephalic and atraumatic.  Eyes: Conjunctivae and EOM are normal.  Neck: Normal range of motion.  Cardiovascular: Normal rate, regular rhythm, normal heart sounds and intact distal pulses.  Pulmonary/Chest: Effort normal and breath sounds normal.  Abdominal: Soft. Bowel sounds are normal.  Lymphadenopathy:    She has no cervical adenopathy.  Neurological: She is alert and oriented to person, place, and time.  Skin: Skin is warm and dry. Capillary refill takes less than 2 seconds.  Psychiatric: She has a normal mood and affect. Her behavior is normal.  Nursing note and vitals reviewed.    Musculoskeletal Exam: C-spine and thoracic lumbar spine good range of motion. She is some stiffness with range of motion of her C-spine. Shoulder joints elbow joints wrist joints are good range of motion. She is some synovial thickening over her left first PIP joint. She has mild DIP PIP thickening in her bilateral hands and feet bilaterally. She also had Larimer prominence in her hands. She has mild swelling in her right ankle joint with some warmth. She also had tenderness between her left third and fourth toe which could be consistent with Morton's neuroma.  CDAI Exam: CDAI Homunculus Exam:   Tenderness:  RLE: tibiotalar  Swelling:  RLE: tibiotalar  Joint Counts:  CDAI Tender Joint count: 0 CDAI Swollen Joint count: 0  Global Assessments:  Patient Global Assessment: 2 Provider Global Assessment: 2  CDAI Calculated Score: 4    Investigation: No additional findings. CBC Latest Ref Rng & Units 09/18/2017 09/04/2017 06/26/2017    WBC 3.8 - 10.8 Thousand/uL 8.0 9.3 10.4  Hemoglobin 11.7 - 15.5 g/dL 12.9 13.8 13.3  Hematocrit 35.0 - 45.0 % 37.3 40.1 39.1  Platelets 140 - 400 Thousand/uL 244 281 272   CMP Latest Ref Rng & Units 09/18/2017 09/04/2017 08/14/2017  Glucose 65 - 99 mg/dL 101(H) 96 -  BUN 7 - 25 mg/dL 13 19 -  Creatinine 0.50 - 1.05 mg/dL 0.86 0.94 -  Sodium 135 - 146 mmol/L 142 139 -  Potassium 3.5 - 5.3 mmol/L 4.3 4.7 -  Chloride 98 - 110 mmol/L 103 101 -  CO2 20 - 32 mmol/L 31 32 -  Calcium 8.6 - 10.4 mg/dL 9.4 9.5 -  Total Protein 6.1 - 8.1 g/dL 6.6 6.6 7.7  Total Bilirubin 0.2 - 1.2 mg/dL 0.4 0.4 -  Alkaline Phos 33 - 130 U/L - - -  AST 10 - 35 U/L 16 15 -  ALT 6 - 29 U/L 6 6 -    Imaging: Xr Foot Complete Left  Result Date: 10/02/2017 PIP/DIP narrowing was noted. No MTP joint narrowing or erosive changes were noted. Spacing between third and fourth toe was noted. No fracture or callus formation was noted. Calcaneal spur was noted. Impression: These  findings are consistent with osteoarthritis of the foot.   Speciality Comments: No specialty comments available.    Procedures:  No procedures performed Allergies: Crab [shellfish allergy]   Assessment / Plan:     Visit Diagnoses: Rheumatoid arthritis involving multiple sites with positive rheumatoid factor (HCC) - Positive anti-CCP, history of positive synovitis. She is doing much better on methotrexate. Although she still have some synovitis and swelling in her right ankle joint. She's been on methotrexate 0.8 ML subcutaneous every week for the last 1 month. I would like to see response to methotrexate at her follow-up visit. If she still has ongoing swelling in her ankle joint. May consider adding Biologics. She also believes that methotrexate is causing increased fatigue. We discussed splitting the MTX dose to twice a week 0.4 mL subcutaneous.  High risk medication use - Methotrexate0.8 ml up to every week, folic acid 2 mg by mouth daily.  Her labs are stable. We will continue to monitor her labs every 3 months.  Pain in left foot: She's been having discomfort between her left third and fourth toe. She has tenderness on palpation which is consistent with Morton's neuroma. Her x-rays of the left foot showed separation of third and fourth toe no erosive changes were noted. No fracture was noted. I offered cortisone injection but patient declined. I'll give her a prescription for diclofenac gel to be used topically. I've also given her prescription for metatarsal pads which she can take to [facility].  Primary osteoarthritis of both hands: She has mild osteoarthritic changes in her hands.  Primary osteoarthritis of both knees: Some discomfort  Primary osteoarthritis of both feet - Plan: XR Foot Complete Left  DDD (degenerative disc disease), cervical  Osteopenia of multiple sites - She is on calcium and vitamin D  Dyslipidemia  History of gastroesophageal reflux (GERD)    Orders: Orders Placed This Encounter  Procedures  . XR Foot Complete Left   Meds ordered this encounter  Medications  . diclofenac sodium (VOLTAREN) 1 % GEL    Sig: Apply 3 gm to 3 large joints up to 3 times a day.Dispense 3 tubes with 3 refills.    Dispense:  3 Tube    Refill:  0    Face-to-face time spent with patient was 30 minutes. Greater than 50% of time was spent in counseling and coordination of care.  Follow-Up Instructions: Return for Rheumatoid arthritis, Osteoarthritis.   Bo Merino, MD  Note - This record has been created using Editor, commissioning.  Chart creation errors have been sought, but may not always  have been located. Such creation errors do not reflect on  the standard of medical care.

## 2017-10-02 NOTE — Telephone Encounter (Signed)
Patient has been scheduled for 10/03/17 at 1:00 pm.

## 2017-10-03 MED FILL — FOLIC ACID 1 MG TABLET: 1 | 90 days supply | Qty: 180 | Fill #0

## 2017-10-08 MED FILL — METHOTREXATE 25 MG/ML VIAL: 50 | 87 days supply | Qty: 10 | Fill #0

## 2017-10-25 ENCOUNTER — Telehealth: Payer: Self-pay | Admitting: Rheumatology

## 2017-10-25 NOTE — Telephone Encounter (Signed)
Patient called to make sure it is okay to take her Methotrexate injection today  due to having a bad cold and taking Nyquil. CB#[phone]

## 2017-10-25 NOTE — Telephone Encounter (Signed)
Patient advised to hold MTX until she is well. Patient verbalized understanding.

## 2017-11-09 MED FILL — BD TB SYRINGE 27GX1/2: 27G X 1/2" | 140 days supply | Qty: 20 | Fill #1

## 2017-11-09 MED FILL — BD TB SYRINGE 27GX1/2": 27G X 1/2" | 140 days supply | Qty: 20 | Fill #1

## 2017-11-16 ENCOUNTER — Other Ambulatory Visit: Payer: Self-pay

## 2017-11-16 DIAGNOSIS — Z79899 Other long term (current) drug therapy: Secondary | ICD-10-CM

## 2017-11-16 LAB — COMPLETE METABOLIC PANEL WITH GFR
AG Ratio: 1.8 (calc) (ref 1.0–2.5)
ALT: 7 U/L (ref 6–29)
AST: 17 U/L (ref 10–35)
Albumin: 4.2 g/dL (ref 3.6–5.1)
Alkaline phosphatase (APISO): 71 U/L (ref 33–130)
BUN: 16 mg/dL (ref 7–25)
CO2: 30 mmol/L (ref 20–32)
Calcium: 9.2 mg/dL (ref 8.6–10.4)
Chloride: 102 mmol/L (ref 98–110)
Creat: 0.68 mg/dL (ref 0.50–1.05)
GFR, Est African American: 113 mL/min/{1.73_m2} (ref >=60)
GFR, Est Non African American: 98 mL/min/{1.73_m2} (ref >=60)
Globulin: 2.4 g/dL (calc) (ref 1.9–3.7)
Glucose, Bld: 118 mg/dL — ABNORMAL HIGH (ref 65–99)
Potassium: 4.1 mmol/L (ref 3.5–5.3)
Sodium: 138 mmol/L (ref 135–146)
Total Bilirubin: 0.4 mg/dL (ref 0.2–1.2)
Total Protein: 6.6 g/dL (ref 6.1–8.1)

## 2017-11-16 LAB — CBC WITH DIFFERENTIAL/PLATELET
Basophils Absolute: 51 cells/uL (ref 0–200)
Basophils Relative: 0.8 %
Eosinophils Absolute: 352 cells/uL (ref 15–500)
Eosinophils Relative: 5.5 %
HCT: 38.1 % (ref 35.0–45.0)
Hemoglobin: 12.9 g/dL (ref 11.7–15.5)
Lymphs Abs: 2784 cells/uL (ref 850–3900)
MCH: 31 pg (ref 27.0–33.0)
MCHC: 33.9 g/dL (ref 32.0–36.0)
MCV: 91.6 fL (ref 80.0–100.0)
MPV: 10.2 fL (ref 7.5–12.5)
Monocytes Relative: 6.4 %
Neutro Abs: 2803 cells/uL (ref 1500–7800)
Neutrophils Relative %: 43.8 %
Platelets: 238 10*3/uL (ref 140–400)
RBC: 4.16 10*6/uL (ref 3.80–5.10)
RDW: 13.9 % (ref 11.0–15.0)
Total Lymphocyte: 43.5 %
WBC mixed population: 410 cells/uL (ref 200–950)
WBC: 6.4 10*3/uL (ref 3.8–10.8)

## 2017-12-11 NOTE — Progress Notes (Signed)
Office Visit Note  Patient: Gina Mitchell             Date of Birth: Aug 14, 1961           MRN: [id number]             PCP: Shirline Frees, MD Referring: Shirline Frees, MD Visit Date: 12/25/2017 Occupation: @GUAROCC @    Subjective:  Other (doing okay )   History of Present Illness: Gina Mitchell is a 57 y.o. female history of seropositive rheumatoid arthritis.  She states she has been taking split days of methotrexate.  She recently took a dose once a week which caused headache and fatigue.  She has not noticed any increased joint pain or joint swelling.  Her right ankle joint is better as well.  Patient states that she works out at [employer] and sometimes she gets thoracic and lower back pain.  Activities of Daily Living:  Patient reports morning stiffness for 2 minutes.   Patient Denies nocturnal pain.  Difficulty dressing/grooming: Denies Difficulty climbing stairs: Denies Difficulty getting out of chair: Denies Difficulty using hands for taps, buttons, cutlery, and/or writing: Denies   Review of Systems  Constitutional: Positive for fatigue. Negative for night sweats, weight gain, weight loss and weakness.  HENT: Negative for mouth sores, trouble swallowing, trouble swallowing, mouth dryness and nose dryness.   Eyes: Positive for dryness. Negative for pain, redness and visual disturbance.  Respiratory: Negative for cough, shortness of breath and difficulty breathing.   Cardiovascular: Negative for chest pain, palpitations, hypertension, irregular heartbeat and swelling in legs/feet.  Gastrointestinal: Positive for constipation. Negative for blood in stool and diarrhea.  Endocrine: Negative for increased urination.  Genitourinary: Negative for vaginal dryness.  Musculoskeletal: Negative for arthralgias, joint pain, joint swelling, myalgias, muscle weakness, morning stiffness, muscle tenderness and myalgias.  Skin: Negative for color change, rash, hair loss, skin tightness,  ulcers and sensitivity to sunlight.  Allergic/Immunologic: Negative for susceptible to infections.  Neurological: Negative for dizziness, memory loss and night sweats.  Hematological: Negative for swollen glands.  Psychiatric/Behavioral: Positive for sleep disturbance. Negative for depressed mood. The patient is not nervous/anxious.     PMFS History:  Patient Active Problem List   Diagnosis Date Noted  . Primary osteoarthritis of both hands 09/08/2017  . Primary osteoarthritis of both feet 09/08/2017  . Primary osteoarthritis of both knees 09/08/2017  . History of gastroesophageal reflux (GERD) 09/08/2017  . Dyslipidemia 09/08/2017  . Osteopenia of multiple sites 09/08/2017  . Right ankle swelling 06/26/2017  . Rheumatoid arthritis ([facility]) 06/26/2017  . DDD (degenerative disc disease), cervical 09/13/2016  . Localized primary carpometacarpal osteoarthritis, right 08/26/2016  . Rotator cuff dysfunction, right 08/26/2016  . Metatarsalgia 08/01/2016  . Metatarsal stress fracture of left foot 04/19/2016  . Abnormal EKG 11/06/2014  . Chest pain 06/10/2014  . Plantar fasciitis, bilateral 11/25/2013    Past Medical History:  Diagnosis Date  . Allergic rhinitis   . Chest pain radiating to jaw   . Esophageal reflux   . Incomplete right bundle branch block   . Situational anxiety     Family History  Problem Relation Age of Onset  . Prostate cancer Father   . Arrhythmia Father   . Breast cancer Sister   . Hypertension Mother    Past Surgical History:  Procedure Laterality Date  . Breast mass removal     Social History   Social History Narrative  . Not on file     Objective: Vital  Signs: BP 134/88 (BP Location: Left Arm, Patient Position: Sitting, Cuff Size: Normal)   Pulse 66   Resp 15   Ht 5\' 2"  (1.575 m)   Wt 123 lb 8 oz (56 kg)   BMI 22.59 kg/m    Physical Exam  Constitutional: She is oriented to person, place, and time. She appears well-developed and  well-nourished.  HENT:  Head: Normocephalic and atraumatic.  Eyes: Conjunctivae and EOM are normal.  Neck: Normal range of motion.  Cardiovascular: Normal rate, regular rhythm, normal heart sounds and intact distal pulses.  Pulmonary/Chest: Effort normal and breath sounds normal.  Abdominal: Soft. Bowel sounds are normal.  Lymphadenopathy:    She has no cervical adenopathy.  Neurological: She is alert and oriented to person, place, and time.  Skin: Skin is warm and dry. Capillary refill takes less than 2 seconds.  Psychiatric: She has a normal mood and affect. Her behavior is normal.  Nursing note and vitals reviewed.    Musculoskeletal Exam: C-spine thoracic lumbar spine good range of motion.  She had no point tenderness.  Shoulder joints elbow joints wrist joint MCPs PIPs DIPs are good range of motion.  She is thickening of DIP joints consistent with osteoarthritis.  Hip joints knee joints ankles MTPs PIPs with good range of motion with no synovitis. CDAI Exam: CDAI Homunculus Exam:   Joint Counts:  CDAI Tender Joint count: 0 CDAI Swollen Joint count: 0  Global Assessments:  Patient Global Assessment: 0 Provider Global Assessment: 0    Investigation: No additional findings. CBC Latest Ref Rng & Units 11/16/2017 09/18/2017 09/04/2017  WBC 3.8 - 10.8 Thousand/uL 6.4 8.0 9.3  Hemoglobin 11.7 - 15.5 g/dL 12.9 12.9 13.8  Hematocrit 35.0 - 45.0 % 38.1 37.3 40.1  Platelets 140 - 400 Thousand/uL 238 244 281   CMP Latest Ref Rng & Units 11/16/2017 09/18/2017 09/04/2017  Glucose 65 - 99 mg/dL 118(H) 101(H) 96  BUN 7 - 25 mg/dL 16 13 19   Creatinine 0.50 - 1.05 mg/dL 0.68 0.86 0.94  Sodium 135 - 146 mmol/L 138 142 139  Potassium 3.5 - 5.3 mmol/L 4.1 4.3 4.7  Chloride 98 - 110 mmol/L 102 103 101  CO2 20 - 32 mmol/L 30 31 32  Calcium 8.6 - 10.4 mg/dL 9.2 9.4 9.5  Total Protein 6.1 - 8.1 g/dL 6.6 6.6 6.6  Total Bilirubin 0.2 - 1.2 mg/dL 0.4 0.4 0.4  Alkaline Phos 33 - 130 U/L - - -    AST 10 - 35 U/L 17 16 15   ALT 6 - 29 U/L 7 6 6     Imaging: No results found.  Speciality Comments: No specialty comments available.    Procedures:  No procedures performed Allergies: Crab [shellfish allergy]   Assessment / Plan:     Visit Diagnoses: Rheumatoid arthritis involving multiple sites with positive rheumatoid factor (HCC) - Positive anti-CCP, history of positive synovitis.  Patient has no synovitis on examination today.  She has been doing really well on methotrexate.  She wants to decrease the methotrexate dose.  We had detailed discussion.  I have advised her to reduce to 2.6 mL subcu weekly.  If she develops increased pain she should notify us and increase the dose.  High risk medication use - MTX 0.8 ml sq q wk, folic acid 2mg  po qd.  Her labs are stable we will continue to monitor labs every 3 months.  Primary osteoarthritis of both hands: Joint protection muscle strengthening discussed.  Primary osteoarthritis of both  knees: Doing better.  Primary osteoarthritis of both feet, changing shoes has been helpful.  DDD (degenerative disc disease), cervical: She has discomfort off and on.  Chronic midline low back pain without sciatica: Lower back pain most likely related to her exercises.  Have given her a handout on some muscle strengthening.  Osteopenia of multiple sites: She is on calcium and vitamin D.  History of gastroesophageal reflux (GERD)  Dyslipidemia   Insomnia: Good sleep hygiene was discussed.     Orders: No orders of the defined types were placed in this encounter.  No orders of the defined types were placed in this encounter.   Face-to-face time spent with patient was 30 minutes.  Greater than 50% of time was spent in counseling and coordination of care.  Follow-Up Instructions: Return in about 5 months (around 05/24/2018) for Rheumatoid arthritis.   Bo Merino, MD  Note - This record has been created using Editor, commissioning.  Chart  creation errors have been sought, but may not always  have been located. Such creation errors do not reflect on  the standard of medical care.

## 2017-12-21 DIAGNOSIS — M06041 Rheumatoid arthritis without rheumatoid factor, right hand: Secondary | ICD-10-CM | POA: Diagnosis not present

## 2017-12-21 DIAGNOSIS — M06042 Rheumatoid arthritis without rheumatoid factor, left hand: Secondary | ICD-10-CM | POA: Diagnosis not present

## 2017-12-25 ENCOUNTER — Ambulatory Visit: Payer: 59 | Admitting: Rheumatology

## 2017-12-25 ENCOUNTER — Encounter: Payer: Self-pay | Admitting: Rheumatology

## 2017-12-25 VITALS — BP 134/88 | HR 66 | Resp 15 | Ht 62.0 in | Wt 123.5 lb

## 2017-12-25 DIAGNOSIS — E785 Hyperlipidemia, unspecified: Secondary | ICD-10-CM | POA: Diagnosis not present

## 2017-12-25 DIAGNOSIS — Z79899 Other long term (current) drug therapy: Secondary | ICD-10-CM | POA: Diagnosis not present

## 2017-12-25 DIAGNOSIS — M0579 Rheumatoid arthritis with rheumatoid factor of multiple sites without organ or systems involvement: Secondary | ICD-10-CM

## 2017-12-25 DIAGNOSIS — G8929 Other chronic pain: Secondary | ICD-10-CM

## 2017-12-25 DIAGNOSIS — M19041 Primary osteoarthritis, right hand: Secondary | ICD-10-CM | POA: Diagnosis not present

## 2017-12-25 DIAGNOSIS — M19072 Primary osteoarthritis, left ankle and foot: Secondary | ICD-10-CM

## 2017-12-25 DIAGNOSIS — M19042 Primary osteoarthritis, left hand: Secondary | ICD-10-CM | POA: Diagnosis not present

## 2017-12-25 DIAGNOSIS — M170 Bilateral primary osteoarthritis of knee: Secondary | ICD-10-CM

## 2017-12-25 DIAGNOSIS — M19071 Primary osteoarthritis, right ankle and foot: Secondary | ICD-10-CM

## 2017-12-25 DIAGNOSIS — M5030 Other cervical disc degeneration, unspecified cervical region: Secondary | ICD-10-CM | POA: Diagnosis not present

## 2017-12-25 DIAGNOSIS — M545 Low back pain: Secondary | ICD-10-CM

## 2017-12-25 DIAGNOSIS — M8589 Other specified disorders of bone density and structure, multiple sites: Secondary | ICD-10-CM | POA: Diagnosis not present

## 2017-12-25 DIAGNOSIS — Z8719 Personal history of other diseases of the digestive system: Secondary | ICD-10-CM

## 2017-12-25 NOTE — Patient Instructions (Addendum)
Back Exercises The following exercises strengthen the muscles that help to support the back. They also help to keep the lower back flexible. Doing these exercises can help to prevent back pain or lessen existing pain. If you have back pain or discomfort, try doing these exercises 2-3 times each day or as told by your health care provider. When the pain goes away, do them once each day, but increase the number of times that you repeat the steps for each exercise (do more repetitions). If you do not have back pain or discomfort, do these exercises once each day or as told by your health care provider. Exercises Single Knee to Chest  Repeat these steps 3-5 times for each leg: 1. Lie on your back on a firm bed or the floor with your legs extended. 2. Bring one knee to your chest. Your other leg should stay extended and in contact with the floor. 3. Hold your knee in place by grabbing your knee or thigh. 4. Pull on your knee until you feel a gentle stretch in your lower back. 5. Hold the stretch for 10-30 seconds. 6. Slowly release and straighten your leg.  Pelvic Tilt  Repeat these steps 5-10 times: 1. Lie on your back on a firm bed or the floor with your legs extended. 2. Bend your knees so they are pointing toward the ceiling and your feet are flat on the floor. 3. Tighten your lower abdominal muscles to press your lower back against the floor. This motion will tilt your pelvis so your tailbone points up toward the ceiling instead of pointing to your feet or the floor. 4. With gentle tension and even breathing, hold this position for 5-10 seconds.  Cat-Cow  Repeat these steps until your lower back becomes more flexible: 1. Get into a hands-and-knees position on a firm surface. Keep your hands under your shoulders, and keep your knees under your hips. You may place padding under your knees for comfort. 2. Let your head hang down, and point your tailbone toward the floor so your lower back  becomes rounded like the back of a cat. 3. Hold this position for 5 seconds. 4. Slowly lift your head and point your tailbone up toward the ceiling so your back forms a sagging arch like the back of a cow. 5. Hold this position for 5 seconds.  Press-Ups  Repeat these steps 5-10 times: 1. Lie on your abdomen (face-down) on the floor. 2. Place your palms near your head, about shoulder-width apart. 3. While you keep your back as relaxed as possible and keep your hips on the floor, slowly straighten your arms to raise the top half of your body and lift your shoulders. Do not use your back muscles to raise your upper torso. You may adjust the placement of your hands to make yourself more comfortable. 4. Hold this position for 5 seconds while you keep your back relaxed. 5. Slowly return to lying flat on the floor.  Bridges  Repeat these steps 10 times: 1. Lie on your back on a firm surface. 2. Bend your knees so they are pointing toward the ceiling and your feet are flat on the floor. 3. Tighten your buttocks muscles and lift your buttocks off of the floor until your waist is at almost the same height as your knees. You should feel the muscles working in your buttocks and the back of your thighs. If you do not feel these muscles, slide your feet 1-2 inches farther away   from your buttocks. 4. Hold this position for 3-5 seconds. 5. Slowly lower your hips to the starting position, and allow your buttocks muscles to relax completely.  If this exercise is too easy, try doing it with your arms crossed over your chest. Abdominal Crunches  Repeat these steps 5-10 times: 1. Lie on your back on a firm bed or the floor with your legs extended. 2. Bend your knees so they are pointing toward the ceiling and your feet are flat on the floor. 3. Cross your arms over your chest. 4. Tip your chin slightly toward your chest without bending your neck. 5. Tighten your abdominal muscles and slowly raise your  trunk (torso) high enough to lift your shoulder blades a tiny bit off of the floor. Avoid raising your torso higher than that, because it can put too much stress on your low back and it does not help to strengthen your abdominal muscles. 6. Slowly return to your starting position.  Back Lifts Repeat these steps 5-10 times: 1. Lie on your abdomen (face-down) with your arms at your sides, and rest your forehead on the floor. 2. Tighten the muscles in your legs and your buttocks. 3. Slowly lift your chest off of the floor while you keep your hips pressed to the floor. Keep the back of your head in line with the curve in your back. Your eyes should be looking at the floor. 4. Hold this position for 3-5 seconds. 5. Slowly return to your starting position.  Contact a health care provider if:  Your back pain or discomfort gets much worse when you do an exercise.  Your back pain or discomfort does not lessen within 2 hours after you exercise. If you have any of these problems, stop doing these exercises right away. Do not do them again unless your health care provider says that you can. Get help right away if:  You develop sudden, severe back pain. If this happens, stop doing the exercises right away. Do not do them again unless your health care provider says that you can. This information is not intended to replace advice given to you by your health care provider. Make sure you discuss any questions you have with your health care provider. Document Released: 11/24/2004 Document Revised: 02/24/2016 Document Reviewed: 12/11/2014 Elsevier Interactive Patient Education  2017 [place] We placed an order today for your standing lab work.    Please come back and get your standing labs in April and every 3 months.  We have open lab Monday through Friday from 8:30-11:30 AM and 1:30-4 PM at the office of Dr. Bo Merino.   The office is located at [address] [place],  [place], [place] [address] No appointment is necessary.   Labs are drawn by [facility].  You may receive a bill from [facility] for your lab work. If you have any questions regarding directions or hours of operation,  please call [phone].

## 2018-01-04 ENCOUNTER — Other Ambulatory Visit: Payer: Self-pay | Admitting: Rheumatology

## 2018-01-04 MED FILL — METHOTREXATE 25 MG/ML VIAL: 50 | 87 days supply | Qty: 10 | Fill #0

## 2018-01-04 MED FILL — FOLIC ACID 1 MG TABLET: 1 | 90 days supply | Qty: 180 | Fill #1

## 2018-01-04 NOTE — Telephone Encounter (Signed)
Last Visit: 12/25/17 Next Visit: 05/23/18 Labs: 11/16/17 WNL, elevated glucose   Okay to refill per Dr. Estanislado Pandy

## 2018-01-22 DIAGNOSIS — H5213 Myopia, bilateral: Secondary | ICD-10-CM | POA: Diagnosis not present

## 2018-02-01 DIAGNOSIS — H04123 Dry eye syndrome of bilateral lacrimal glands: Secondary | ICD-10-CM | POA: Diagnosis not present

## 2018-02-01 MED FILL — LOTEMAX 0.5% GEL: 0.5 | 12 days supply | Qty: 5 | Fill #0

## 2018-02-08 ENCOUNTER — Other Ambulatory Visit: Payer: Self-pay | Admitting: Physician Assistant

## 2018-02-08 ENCOUNTER — Other Ambulatory Visit: Payer: Self-pay

## 2018-02-08 DIAGNOSIS — Z79899 Other long term (current) drug therapy: Secondary | ICD-10-CM | POA: Diagnosis not present

## 2018-02-08 DIAGNOSIS — R5383 Other fatigue: Secondary | ICD-10-CM

## 2018-02-08 LAB — COMPLETE METABOLIC PANEL WITH GFR
AG Ratio: 1.9 (calc) (ref 1.0–2.5)
ALT: 17 U/L (ref 6–29)
AST: 27 U/L (ref 10–35)
Albumin: 4.3 g/dL (ref 3.6–5.1)
Alkaline phosphatase (APISO): 79 U/L (ref 33–130)
BUN: 18 mg/dL (ref 7–25)
CO2: 32 mmol/L (ref 20–32)
Calcium: 9.4 mg/dL (ref 8.6–10.4)
Chloride: 103 mmol/L (ref 98–110)
Creat: 0.75 mg/dL (ref 0.50–1.05)
GFR, Est African American: 103 mL/min/{1.73_m2} (ref >=60)
GFR, Est Non African American: 89 mL/min/{1.73_m2} (ref >=60)
Globulin: 2.3 g/dL (calc) (ref 1.9–3.7)
Glucose, Bld: 88 mg/dL (ref 65–99)
Potassium: 4.9 mmol/L (ref 3.5–5.3)
Sodium: 139 mmol/L (ref 135–146)
Total Bilirubin: 0.4 mg/dL (ref 0.2–1.2)
Total Protein: 6.6 g/dL (ref 6.1–8.1)

## 2018-02-08 LAB — CBC WITH DIFFERENTIAL/PLATELET
Basophils Absolute: 98 cells/uL (ref 0–200)
Basophils Relative: 1.4 %
Eosinophils Absolute: 357 cells/uL (ref 15–500)
Eosinophils Relative: 5.1 %
HCT: 38.5 % (ref 35.0–45.0)
Hemoglobin: 13.3 g/dL (ref 11.7–15.5)
Lymphs Abs: 2023 cells/uL (ref 850–3900)
MCH: 32.1 pg (ref 27.0–33.0)
MCHC: 34.5 g/dL (ref 32.0–36.0)
MCV: 93 fL (ref 80.0–100.0)
MPV: 10 fL (ref 7.5–12.5)
Monocytes Relative: 7.3 %
Neutro Abs: 4011 cells/uL (ref 1500–7800)
Neutrophils Relative %: 57.3 %
Platelets: 256 10*3/uL (ref 140–400)
RBC: 4.14 10*6/uL (ref 3.80–5.10)
RDW: 13.6 % (ref 11.0–15.0)
Total Lymphocyte: 28.9 %
WBC mixed population: 511 cells/uL (ref 200–950)
WBC: 7 10*3/uL (ref 3.8–10.8)

## 2018-02-08 LAB — MAGNESIUM: Magnesium: 2 mg/dL (ref 1.5–2.5)

## 2018-03-09 ENCOUNTER — Ambulatory Visit: Payer: 59 | Admitting: Cardiovascular Disease

## 2018-03-09 ENCOUNTER — Encounter: Payer: Self-pay | Admitting: Cardiovascular Disease

## 2018-03-09 ENCOUNTER — Encounter

## 2018-03-09 VITALS — BP 118/90 | HR 63 | Ht 62.0 in | Wt 121.0 lb

## 2018-03-09 DIAGNOSIS — I517 Cardiomegaly: Secondary | ICD-10-CM

## 2018-03-09 DIAGNOSIS — R072 Precordial pain: Secondary | ICD-10-CM | POA: Diagnosis not present

## 2018-03-09 NOTE — Patient Instructions (Signed)
Medication Instructions:  Your physician recommends that you continue on your current medications as directed. Please refer to the Current Medication list given to you today.   Labwork: none  Testing/Procedures: Your physician has requested that you have an echocardiogram. Echocardiography is a painless test that uses sound waves to create images of your heart. It provides your doctor with information about the size and shape of your heart and how well your heart's chambers and valves are working. This procedure takes approximately one hour. There are no restrictions for this procedure.    Follow-Up: Your physician wants you to follow-up in: 2 years You will receive a reminder letter in the mail two months in advance. If you don't receive a letter, please call our office to schedule the follow-up appointment.   Any Other Special Instructions Will Be Listed Below (If Applicable).     If you need a refill on your cardiac medications before your next appointment, please call your pharmacy.   

## 2018-03-09 NOTE — Progress Notes (Signed)
Chief Complaint  Gina Mitchell presents with  . New Gina Mitchell (Initial Visit)    chest pain   History of Present Illness: 57 yo female with history of rheumatoid arthritis, hyperlipidemia, GERD and anxiety who is here today as a new consult, referred by Dr. Kenton Kingfisher, for the evaluation of chest pain. I saw her remotely for evaluation of chest pain, last visit in our office in 2015. Echo June 2015 with normal LV size and function, no valve issues. Exercise stress test August 2015 with no ischemic changes. Her pain was felt to be GI related and she was continued on a PPI. She was diagnosed with rheumatoid arthritis in 2018 and has been on methotrexate. She c/o chest pain at her primary care visit in November 2018. Her pain was felt to be atypical at that time. EKG November 2018 with sinus rhythm, no ischemic changes. She has no history of CAD.   She tells me today that she has occasional chest pressure. No exertional chest pain. No dyspnea. She has some dizziness but no near syncope or syncope. She is very active and exercises often. She works as a Marine scientist in [employer].   Primary Care Physician: Shirline Frees, MD  Past Medical History:  Diagnosis Date  . Allergic rhinitis   . Esophageal reflux   . Incomplete right bundle branch block   . Rheumatoid arthritis ([place])   . Situational anxiety     Past Surgical History:  Procedure Laterality Date  . Breast mass removal      Current Outpatient Medications  Medication Sig Dispense Refill  . acetaminophen (TYLENOL) 650 MG CR tablet Take 1 tablet (650 mg total) by mouth every 8 (eight) hours as needed for pain. 90 tablet 3  . Cholecalciferol (VITAMIN D3 PO) Take by mouth 1 day or 1 dose.    . famotidine (PEPCID AC) 10 MG chewable tablet Chew 10 mg by mouth daily as needed for heartburn.    . folic acid (FOLVITE) 1 MG tablet Take 2 tablets (2 mg total) by mouth daily. 180 tablet 3  . ibuprofen (ADVIL,MOTRIN) 200 MG tablet Take 200 mg by mouth as needed.      . methotrexate 50 MG/2ML injection Inject 0.8 mLs (20 mg total) into the skin once a week. 10 mL 0  . ondansetron (ZOFRAN) 4 MG tablet Take 1 tablet (4 mg total) every 6 (six) hours as needed by mouth for nausea or vomiting. 30 tablet 2  . sharps container 1 each by Does not apply route as needed. 1 each 6  . Tuberculin-Allergy Syringes 27G X 1/2" 1 ML MISC Gina Mitchell to use to inject Methotrexate once weekly 12 each 3  . vitamin E (VITAMIN E) 200 UNIT capsule Take 200 Units by mouth daily.     No current facility-administered medications for this visit.     Allergies  Allergen Reactions  . Crab [Shellfish Allergy]     Social History   Socioeconomic History  . Marital status: Married    Spouse name: Not on file  . Number of children: 3  . Years of education: Not on file  . Highest education level: Not on file  Occupational History  . Occupation: Programmer, multimedia: [employer]  . Financial resource strain: Not on file  . Food insecurity:    Worry: Not on file    Inability: Not on file  . Transportation needs:    Medical: Not on file    Non-medical: Not  on file  Tobacco Use  . Smoking status: Never Smoker  . Smokeless tobacco: Never Used  Substance and Sexual Activity  . Alcohol use: No  . Drug use: No  . Sexual activity: Not on file  Lifestyle  . Physical activity:    Days per week: Not on file    Minutes per session: Not on file  . Stress: Not on file  Relationships  . Social connections:    Talks on phone: Not on file    Gets together: Not on file    Attends religious service: Not on file    Active member of club or organization: Not on file    Attends meetings of clubs or organizations: Not on file    Relationship status: Not on file  . Intimate partner violence:    Fear of current or ex partner: Not on file    Emotionally abused: Not on file    Physically abused: Not on file    Forced sexual activity: Not on file  Other Topics Concern  . Not on  file  Social History Narrative  . Not on file    Family History  Problem Relation Age of Onset  . Prostate cancer Father   . Arrhythmia Father   . Breast cancer Sister   . Hypertension Mother     Review of Systems:  As stated in the HPI and otherwise negative.   BP 118/90   Pulse 63   Ht 5\' 2"  (1.575 m)   Wt 121 lb (54.9 kg)   SpO2 99%   BMI 22.13 kg/m   Physical Examination: General: Well developed, well nourished, NAD  HEENT: OP clear, mucus membranes moist  SKIN: warm, dry. No rashes. Neuro: No focal deficits  Musculoskeletal: Muscle strength 5/5 all ext  Psychiatric: Mood and affect normal  Neck: No JVD, no carotid bruits, no thyromegaly, no lymphadenopathy.  Lungs:Clear bilaterally, no wheezes, rhonci, crackles Cardiovascular: Regular rate and rhythm. No murmurs, gallops or rubs. Abdomen:Soft. Bowel sounds present. Non-tender.  Extremities: No lower extremity edema. Pulses are 2 + in the bilateral DP/PT.  EKG:  EKG is ordered today. The ekg ordered today demonstrates NSR, rate 63 bpm.   Recent Labs: 02/08/2018: ALT 17; BUN 18; Creat 0.75; Hemoglobin 13.3; Magnesium 2.0; Platelets 256; Potassium 4.9; Sodium 139   Lipid Panel No results found for: CHOL, TRIG, HDL, CHOLHDL, VLDL, LDLCALC, LDLDIRECT   Wt Readings from Last 3 Encounters:  03/09/18 121 lb (54.9 kg)  12/25/17 123 lb 8 oz (56 kg)  10/02/17 123 lb (55.8 kg)     Other studies Reviewed: Additional studies/ records that were reviewed today include:  Review of the above records demonstrates:   Assessment and Plan:   1. Chest pain: Atypical. EKG is normal. I do not think her resting chest pain is cardiac related.   2. Abnormal chest x-ray: Will arrange echo to assess LV size. Cardiomegaly noted on chest x-ray.   Current medicines are reviewed at length with the Gina Mitchell today.  The Gina Mitchell does not have concerns regarding medicines.  The following changes have been made:  no change  Labs/ tests  ordered today include:   Orders Placed This Encounter  Procedures  . EKG 12-Lead  . ECHOCARDIOGRAM COMPLETE     Disposition:   FU with me in 24 months   Signed, Lauree Chandler, MD 03/09/2018 3:18 PM    [facility] [address], [place], [place]  [address] Phone: [phone];  Fax: [phone]

## 2018-03-12 ENCOUNTER — Other Ambulatory Visit: Payer: Self-pay

## 2018-03-12 ENCOUNTER — Ambulatory Visit (HOSPITAL_COMMUNITY): Payer: 59 | Attending: Internal Medicine

## 2018-03-12 DIAGNOSIS — E785 Hyperlipidemia, unspecified: Secondary | ICD-10-CM | POA: Insufficient documentation

## 2018-03-12 DIAGNOSIS — M069 Rheumatoid arthritis, unspecified: Secondary | ICD-10-CM | POA: Insufficient documentation

## 2018-03-12 DIAGNOSIS — I517 Cardiomegaly: Secondary | ICD-10-CM | POA: Diagnosis not present

## 2018-03-12 DIAGNOSIS — R072 Precordial pain: Secondary | ICD-10-CM | POA: Insufficient documentation

## 2018-03-12 DIAGNOSIS — I4510 Unspecified right bundle-branch block: Secondary | ICD-10-CM | POA: Insufficient documentation

## 2018-03-19 ENCOUNTER — Telehealth: Payer: Self-pay | Admitting: Rheumatology

## 2018-03-19 DIAGNOSIS — R300 Dysuria: Secondary | ICD-10-CM | POA: Diagnosis not present

## 2018-03-19 DIAGNOSIS — K5900 Constipation, unspecified: Secondary | ICD-10-CM | POA: Diagnosis not present

## 2018-03-19 DIAGNOSIS — M069 Rheumatoid arthritis, unspecified: Secondary | ICD-10-CM | POA: Diagnosis not present

## 2018-03-19 DIAGNOSIS — R52 Pain, unspecified: Secondary | ICD-10-CM | POA: Diagnosis not present

## 2018-03-19 DIAGNOSIS — R05 Cough: Secondary | ICD-10-CM | POA: Diagnosis not present

## 2018-03-19 NOTE — Telephone Encounter (Signed)
Patient advised to follow up with PCP as what she is experiencing are likely not side effects from MTX. Patient is on the way to PCP now. Patient has cancelled appointment for Dr. Estanislado Pandy on 03/21/18 as she is not having a flare.

## 2018-03-19 NOTE — Progress Notes (Signed)
Office Visit Note  Patient: Gina Mitchell             Date of Birth: Aug 28, 1961           MRN: [id number]             PCP: Shirline Frees, MD Referring: Shirline Frees, MD Visit Date: 03/21/2018 Occupation: @GUAROCC @    Subjective:  Medication management.   History of Present Illness: Gina Mitchell is a 57 y.o. female with history of seropositive rheumatoid arthritis.  She states about 2 weeks ago she started having low-grade fever and chills.  She was also having some sore throat and some symptoms of dysuria.  She was seen by her PCP who evaluated her and felt that she had viral syndrome.  He also checked her urine which was negative for UTI per patient.  She states she still have some residual sore throat.  She did not take any antibiotics.  She has reflux symptoms and she is concerned that her symptoms could be related to her reflux.  She denies any joint pain or joint swelling.  She has been off methotrexate for a week now due to recent infection.  She denies any discomfort in her knee joints.  She does not have much neck stiffness.  Heart  Activities of Daily Living:  Patient reports morning stiffness for 0 minute.   Patient Denies nocturnal pain.  Difficulty dressing/grooming: Denies Difficulty climbing stairs: Denies Difficulty getting out of chair: Denies Difficulty using hands for taps, buttons, cutlery, and/or writing: Denies   Review of Systems  Constitutional: Negative for fatigue, night sweats, weight gain and weight loss.  HENT: Negative for mouth sores, trouble swallowing, trouble swallowing, mouth dryness and nose dryness.   Eyes: Positive for dryness. Negative for pain, redness and visual disturbance.  Respiratory: Negative for cough, shortness of breath and difficulty breathing.   Cardiovascular: Negative for chest pain, palpitations, hypertension, irregular heartbeat and swelling in legs/feet.  Gastrointestinal: Negative for blood in stool, constipation and  diarrhea.  Endocrine: Negative for increased urination.  Genitourinary: Negative for vaginal dryness.  Musculoskeletal: Negative for arthralgias, joint pain, joint swelling, myalgias, muscle weakness, morning stiffness, muscle tenderness and myalgias.  Skin: Negative for color change, rash, hair loss, skin tightness, ulcers and sensitivity to sunlight.  Allergic/Immunologic: Negative for susceptible to infections.  Neurological: Negative for dizziness, memory loss, night sweats and weakness.  Hematological: Negative for swollen glands.  Psychiatric/Behavioral: Positive for sleep disturbance. Negative for depressed mood. The patient is not nervous/anxious.     PMFS History:  Patient Active Problem List   Diagnosis Date Noted  . Primary osteoarthritis of both hands 09/08/2017  . Primary osteoarthritis of both feet 09/08/2017  . Primary osteoarthritis of both knees 09/08/2017  . History of gastroesophageal reflux (GERD) 09/08/2017  . Dyslipidemia 09/08/2017  . Osteopenia of multiple sites 09/08/2017  . Right ankle swelling 06/26/2017  . Rheumatoid arthritis ([place]) 06/26/2017  . DDD (degenerative disc disease), cervical 09/13/2016  . Localized primary carpometacarpal osteoarthritis, right 08/26/2016  . Rotator cuff dysfunction, right 08/26/2016  . Metatarsalgia 08/01/2016  . Metatarsal stress fracture of left foot 04/19/2016  . Abnormal EKG 11/06/2014  . Chest pain 06/10/2014  . Plantar fasciitis, bilateral 11/25/2013    Past Medical History:  Diagnosis Date  . Allergic rhinitis   . Esophageal reflux   . Incomplete right bundle branch block   . Rheumatoid arthritis ([place])   . Situational anxiety     Family History  Problem Relation Age of Onset  . Prostate cancer Father   . Arrhythmia Father   . Breast cancer Sister   . Hypertension Mother    Past Surgical History:  Procedure Laterality Date  . Breast mass removal     Social History   Social History Narrative  . Not on  file     Objective: Vital Signs: BP 127/90 (BP Location: Left Arm, Patient Position: Sitting, Cuff Size: Normal)   Pulse 75   Resp 12   Ht 5\' 1"  (1.549 m)   Wt 120 lb 8 oz (54.7 kg)   BMI 22.77 kg/m    Physical Exam  Constitutional: She is oriented to person, place, and time. She appears well-developed and well-nourished.  HENT:  Head: Normocephalic and atraumatic.  Eyes: Conjunctivae and EOM are normal.  Neck: Normal range of motion.  Cardiovascular: Normal rate, regular rhythm, normal heart sounds and intact distal pulses.  Pulmonary/Chest: Effort normal and breath sounds normal.  Abdominal: Soft. Bowel sounds are normal.  Lymphadenopathy:    She has no cervical adenopathy.  Neurological: She is alert and oriented to person, place, and time.  Skin: Skin is warm and dry. Capillary refill takes less than 2 seconds.  Psychiatric: She has a normal mood and affect. Her behavior is normal.  Nursing note and vitals reviewed.    Musculoskeletal Exam: C-spine thoracic lumbar spine good range of motion.  Shoulder joints elbow joints wrist joint MCPs PIPs DIPs were in good range of motion with no synovitis.  Hip joints knee joints ankles MTPs PIPs DIPs were in good range of motion with no synovitis.  CDAI Exam: CDAI Homunculus Exam:   Joint Counts:  CDAI Tender Joint count: 0 CDAI Swollen Joint count: 0  Global Assessments:  Patient Global Assessment: 0 Provider Global Assessment: 0    Investigation: No additional findings. CBC Latest Ref Rng & Units 02/08/2018 11/16/2017 09/18/2017  WBC 3.8 - 10.8 Thousand/uL 7.0 6.4 8.0  Hemoglobin 11.7 - 15.5 g/dL 13.3 12.9 12.9  Hematocrit 35.0 - 45.0 % 38.5 38.1 37.3  Platelets 140 - 400 Thousand/uL 256 238 244   CMP Latest Ref Rng & Units 02/08/2018 11/16/2017 09/18/2017  Glucose 65 - 99 mg/dL 88 118(H) 101(H)  BUN 7 - 25 mg/dL 18 16 13   Creatinine 0.50 - 1.05 mg/dL 0.75 0.68 0.86  Sodium 135 - 146 mmol/L 139 138 142  Potassium 3.5  - 5.3 mmol/L 4.9 4.1 4.3  Chloride 98 - 110 mmol/L 103 102 103  CO2 20 - 32 mmol/L 32 30 31  Calcium 8.6 - 10.4 mg/dL 9.4 9.2 9.4  Total Protein 6.1 - 8.1 g/dL 6.6 6.6 6.6  Total Bilirubin 0.2 - 1.2 mg/dL 0.4 0.4 0.4  Alkaline Phos 33 - 130 U/L - - -  AST 10 - 35 U/L 27 17 16   ALT 6 - 29 U/L 17 7 6     Imaging: No results found.  Speciality Comments: No specialty comments available.    Procedures:  No procedures performed Allergies: Crab [shellfish allergy]   Assessment / Plan:     Visit Diagnoses: Rheumatoid arthritis involving multiple sites with positive rheumatoid factor (HCC) - Positive anti-CCP, history of positive synovitis.  Patient is clinically doing well without any synovitis on examination.  She is currently on methotrexate 0.6 mL subcu weekly.  She has not noticed any flare of arthritis on reduced dose of methotrexate.  She will continue current regimen.  She has been off methotrexate for 1 week  due to recent upper respiratory tract infection.  High risk medication use - MTX 0.6 ml sq q wk, folic acid 1mg  po qd. her labs have been stable.  We will continue to monitor labs every 3 months.  Primary osteoarthritis of both hands-joint protection muscle strengthening discussed.  Primary osteoarthritis of both knees-she is currently not having any discomfort.  Primary osteoarthritis of both feet-she has been using proper fitting shoes.  Other fatigue-fatigue is only related with methotrexate use.  Primary insomnia-good sleep hygiene was discussed.  I also discussed use of over-the-counter melatonin.  DDD (degenerative disc disease), cervical-doing better.  Osteopenia of multiple sites-he is on calcium and vitamin D.  Chronic midline low back pain without sciatica-she is currently not having much discomfort.  Dyslipidemia  History of gastroesophageal reflux (GERD)-dietary modifications were discussed.  I also discussed possible use of Zantac on as needed  basis.   Orders: No orders of the defined types were placed in this encounter.  No orders of the defined types were placed in this encounter.   Face-to-face time spent with patient was 30 minutes.> 50% of time was spent in counseling and coordination of care.  Follow-Up Instructions: Return in about 5 months (around 08/21/2018) for Rheumatoid arthritis, Osteoarthritis.   Bo Merino, MD  Note - This record has been created using Editor, commissioning.  Chart creation errors have been sought, but may not always  have been located. Such creation errors do not reflect on  the standard of medical care.

## 2018-03-19 NOTE — Telephone Encounter (Signed)
Patient called stating she has been experiencing chills, cough, sore throat and achiness for over a week.  Patient states she is not sure if it is the flu or a side effect from the Methotrexate.  Patient is calling her PCP today to schedule an appointment and also scheduled an appointment to see Dr. Estanislado Pandy on 03/21/18 at 11:00 am.

## 2018-03-21 ENCOUNTER — Ambulatory Visit: Payer: 59 | Admitting: Rheumatology

## 2018-03-21 ENCOUNTER — Encounter: Payer: Self-pay | Admitting: Rheumatology

## 2018-03-21 VITALS — BP 127/90 | HR 75 | Resp 12 | Ht 61.0 in | Wt 120.5 lb

## 2018-03-21 DIAGNOSIS — Z79899 Other long term (current) drug therapy: Secondary | ICD-10-CM | POA: Diagnosis not present

## 2018-03-21 DIAGNOSIS — M5030 Other cervical disc degeneration, unspecified cervical region: Secondary | ICD-10-CM | POA: Diagnosis not present

## 2018-03-21 DIAGNOSIS — M19071 Primary osteoarthritis, right ankle and foot: Secondary | ICD-10-CM | POA: Diagnosis not present

## 2018-03-21 DIAGNOSIS — K5909 Other constipation: Secondary | ICD-10-CM

## 2018-03-21 DIAGNOSIS — M19072 Primary osteoarthritis, left ankle and foot: Secondary | ICD-10-CM

## 2018-03-21 DIAGNOSIS — M170 Bilateral primary osteoarthritis of knee: Secondary | ICD-10-CM | POA: Diagnosis not present

## 2018-03-21 DIAGNOSIS — M19041 Primary osteoarthritis, right hand: Secondary | ICD-10-CM | POA: Diagnosis not present

## 2018-03-21 DIAGNOSIS — M0579 Rheumatoid arthritis with rheumatoid factor of multiple sites without organ or systems involvement: Secondary | ICD-10-CM

## 2018-03-21 DIAGNOSIS — E785 Hyperlipidemia, unspecified: Secondary | ICD-10-CM

## 2018-03-21 DIAGNOSIS — G8929 Other chronic pain: Secondary | ICD-10-CM

## 2018-03-21 DIAGNOSIS — M8589 Other specified disorders of bone density and structure, multiple sites: Secondary | ICD-10-CM | POA: Diagnosis not present

## 2018-03-21 DIAGNOSIS — F5101 Primary insomnia: Secondary | ICD-10-CM | POA: Diagnosis not present

## 2018-03-21 DIAGNOSIS — M545 Low back pain: Secondary | ICD-10-CM

## 2018-03-21 DIAGNOSIS — M19042 Primary osteoarthritis, left hand: Secondary | ICD-10-CM

## 2018-03-21 DIAGNOSIS — R5383 Other fatigue: Secondary | ICD-10-CM | POA: Diagnosis not present

## 2018-03-21 DIAGNOSIS — Z8719 Personal history of other diseases of the digestive system: Secondary | ICD-10-CM

## 2018-03-21 NOTE — Patient Instructions (Signed)
Standing Labs We placed an order today for your standing lab work.    Please come back and get your standing labs in July and every 3 months   We have open lab Monday through Friday from 8:30-11:30 AM and 1:30-4:00 PM  at the office of Dr. Baylor Teegarden.   You may experience shorter wait times on Monday and Friday afternoons. The office is located at [address], [address], [place], [place] [address] No appointment is necessary.   Labs are drawn by [facility].  You may receive a bill from [facility] for your lab work. If you have any questions regarding directions or hours of operation,  please call [phone].    

## 2018-04-05 DIAGNOSIS — R1013 Epigastric pain: Secondary | ICD-10-CM | POA: Diagnosis not present

## 2018-05-01 ENCOUNTER — Other Ambulatory Visit: Payer: Self-pay | Admitting: Rheumatology

## 2018-05-01 MED FILL — METHOTREXATE 50 MG/2 ML VIA: 50 | 28 days supply | Qty: 8 | Fill #0

## 2018-05-01 NOTE — Telephone Encounter (Signed)
Last Visit: 03/21/18 Next Visit: 08/20/18 Labs: 02/08/18 WNL  Okay to refill per Dr. Estanislado Pandy

## 2018-05-07 NOTE — Progress Notes (Signed)
Office Visit Note  Patient: Gina Mitchell             Date of Birth: 08-Feb-1961           MRN: [id number]             PCP: Shirline Frees, MD Referring: Shirline Frees, MD Visit Date: 05/08/2018 Occupation: @GUAROCC @    Subjective:  Abdominal pain   History of Present Illness: Gina Mitchell is a 57 y.o. female with history of seropositive rheumatoid arthritis, osteoarthritis, and DDD. Patient is taking MTX 0.6 ml subcutaneous injections once weekly and folic acid 1 mg daily.  Her last injection was on Sunday.  She states she has been experiencing increased nausea following her injections.  She states she is also been having increased reflux.  She saw her PCP about 3 weeks ago who started her on Zantac 150 mg twice daily as well as probiotics daily.  She continues to have significant bloating as well as constipation.  She denies any diarrhea or blood in her stool.  She denies any fevers.  She reports that she has been experiencing increased fatigue.  In her neck especially at night when she is trying to get comfortable.  She denies any other joint pain or joint swelling at this time.  Denies any recent rheumatoid arthritis flares.  Denies any joint stiffness.  Activities of Daily Living:  Patient reports morning stiffness for 0 minutes.   Patient Reports nocturnal pain.  Difficulty dressing/grooming: Denies Difficulty climbing stairs: Denies Difficulty getting out of chair: Denies Difficulty using hands for taps, buttons, cutlery, and/or writing: Denies   Review of Systems  Constitutional: Positive for fatigue.  HENT: Negative for mouth sores, mouth dryness and nose dryness.   Eyes: Positive for dryness. Negative for pain and visual disturbance.  Respiratory: Negative for cough, hemoptysis, shortness of breath and difficulty breathing.   Cardiovascular: Negative for chest pain, palpitations, hypertension and swelling in legs/feet.  Gastrointestinal: Positive for constipation,  heartburn and nausea. Negative for blood in stool and diarrhea.  Endocrine: Negative for increased urination.  Genitourinary: Negative for painful urination.  Musculoskeletal: Positive for arthralgias, joint pain and muscle tenderness. Negative for joint swelling, myalgias, muscle weakness, morning stiffness and myalgias.  Skin: Negative for color change, pallor, rash, hair loss, nodules/bumps, skin tightness, ulcers and sensitivity to sunlight.  Allergic/Immunologic: Negative for susceptible to infections.  Neurological: Negative for dizziness, numbness, headaches and weakness.  Hematological: Negative for swollen glands.  Psychiatric/Behavioral: Negative for depressed mood and sleep disturbance. The patient is not nervous/anxious.     PMFS History:  Patient Active Problem List   Diagnosis Date Noted  . Primary osteoarthritis of both hands 09/08/2017  . Primary osteoarthritis of both feet 09/08/2017  . Primary osteoarthritis of both knees 09/08/2017  . History of gastroesophageal reflux (GERD) 09/08/2017  . Dyslipidemia 09/08/2017  . Osteopenia of multiple sites 09/08/2017  . Right ankle swelling 06/26/2017  . Rheumatoid arthritis ([place]) 06/26/2017  . DDD (degenerative disc disease), cervical 09/13/2016  . Localized primary carpometacarpal osteoarthritis, right 08/26/2016  . Rotator cuff dysfunction, right 08/26/2016  . Metatarsalgia 08/01/2016  . Metatarsal stress fracture of left foot 04/19/2016  . Abnormal EKG 11/06/2014  . Chest pain 06/10/2014  . Plantar fasciitis, bilateral 11/25/2013    Past Medical History:  Diagnosis Date  . Allergic rhinitis   . Esophageal reflux   . Incomplete right bundle branch block   . Rheumatoid arthritis ([place])   . Situational anxiety  Family History  Problem Relation Age of Onset  . Prostate cancer Father   . Arrhythmia Father   . Breast cancer Sister   . Hypertension Mother    Past Surgical History:  Procedure Laterality Date  .  Breast mass removal     Social History   Social History Narrative  . Not on file     Objective: Vital Signs: BP 125/86 (BP Location: Left Arm, Patient Position: Sitting, Cuff Size: Normal)   Pulse (!) 56   Resp 12   Ht 5\' 2"  (1.575 m)   Wt 125 lb (56.7 kg)   BMI 22.86 kg/m    Physical Exam  Constitutional: She is oriented to person, place, and time. She appears well-developed and well-nourished.  HENT:  Head: Normocephalic and atraumatic.  Eyes: Conjunctivae and EOM are normal.  Neck: Normal range of motion.  Cardiovascular: Normal rate, regular rhythm, normal heart sounds and intact distal pulses.  Pulmonary/Chest: Effort normal and breath sounds normal.  Abdominal: Soft. Bowel sounds are normal. Distention: Mild bloating. There is tenderness. There is no guarding.  Lymphadenopathy:    She has no cervical adenopathy.  Neurological: She is alert and oriented to person, place, and time.  Skin: Skin is warm and dry. Capillary refill takes less than 2 seconds.  Psychiatric: She has a normal mood and affect. Her behavior is normal.  Nursing note and vitals reviewed.    Musculoskeletal Exam: C-spine, thoracic spine, lumbar spine good range of motion.  No midline spinal tenderness.  No SI joint tenderness.  Shoulder joints, elbow joints, wrist joints, MCPs, PIPs, DIPs good range of motion with no synovitis.  Hip joints, knee joints, ankle joints, MTPs, PIPs, DIPs good range of motion no synovitis.  No warmth or effusion of bilateral knee joints.  No tenderness of trochanteric bursa bilaterally.  CDAI Exam: CDAI Homunculus Exam:   Joint Counts:  CDAI Tender Joint count: 0 CDAI Swollen Joint count: 0  Global Assessments:  Patient Global Assessment: 2 Provider Global Assessment: 2  CDAI Calculated Score: 4    Investigation: No additional findings. CBC Latest Ref Rng & Units 02/08/2018 11/16/2017 09/18/2017  WBC 3.8 - 10.8 Thousand/uL 7.0 6.4 8.0  Hemoglobin 11.7 - 15.5  g/dL 13.3 12.9 12.9  Hematocrit 35.0 - 45.0 % 38.5 38.1 37.3  Platelets 140 - 400 Thousand/uL 256 238 244   CMP Latest Ref Rng & Units 02/08/2018 11/16/2017 09/18/2017  Glucose 65 - 99 mg/dL 88 118(H) 101(H)  BUN 7 - 25 mg/dL 18 16 13   Creatinine 0.50 - 1.05 mg/dL 0.75 0.68 0.86  Sodium 135 - 146 mmol/L 139 138 142  Potassium 3.5 - 5.3 mmol/L 4.9 4.1 4.3  Chloride 98 - 110 mmol/L 103 102 103  CO2 20 - 32 mmol/L 32 30 31  Calcium 8.6 - 10.4 mg/dL 9.4 9.2 9.4  Total Protein 6.1 - 8.1 g/dL 6.6 6.6 6.6  Total Bilirubin 0.2 - 1.2 mg/dL 0.4 0.4 0.4  Alkaline Phos 33 - 130 U/L - - -  AST 10 - 35 U/L 27 17 16   ALT 6 - 29 U/L 17 7 6      Imaging: No results found.  Speciality Comments: No specialty comments available.    Procedures:  No procedures performed Allergies: Crab [shellfish allergy]   Assessment / Plan:     Visit Diagnoses: Rheumatoid arthritis involving multiple sites with positive rheumatoid factor (HCC) - Positive anti-CCP: She has no active synovitis on exam.  She has not had any  recent rheumatoid arthritis flares.  She denies any joint pain or joint swelling at this time.  She has been doing well on methotrexate 0.6 mL subcutaneously once weekly and folic acid 1 mg daily.  She has been having increased fatigue and nausea following the methotrexate injections weekly.  She would like to further taper her dose of methotrexate.  She continues to not have any flares or joint pain or joint swelling we will consider further lowering her dose at her next visit.  High risk medication use - MTX 0.6 ml sq q wk, folic acid 1mg  po qd. CBC and CMP are drawn today to monitor for drug toxicity.  She will return in October and every 3 months for CBC and CMP. - Plan: CBC with Differential/Platelet, COMPLETE METABOLIC PANEL WITH GFR  Primary osteoarthritis of both hands: She has complete fist formation.  She has no synovitis or tenderness on exam.   Primary osteoarthritis of both knees: No  warmth or effusion of knee joints.  Good ROM on exam.  She has no discomfort at this time.   Primary osteoarthritis of both feet: She has no synovitis or tenderness on exam.   Other fatigue: Her fatigue is chronic and likely related to insomnia.  She experiences fatigue following MTX injections weekly.   Primary insomnia: She continues to have chronic insomnia.    DDD (degenerative disc disease), cervical: She has good ROM on exam with no discomfort.  She has tenderness in bilateral trapezius muscles.  She occasionally has discomfort at night.    Epigastric pain -She is experiencing abdominal pain and bloating for the past 3 weeks.  She followed up with her PCP for GERD.  She was started on Zantac's 150 mg twice daily as well as probiotics daily.  Her GERD has improved slightly.   She is concerned the bloating and abdomina pain are due to MTX.  She has recently reduced her dose, but she reports nausea the day after her injection.  She continues to have bloating and intermittent abdominal pain.  She has tenderness in the epigastric and right upper quadrant today.  She is advised to follow-up with her GI specialist. We will check amylase and lipase today.  plan: Amylase, Lipase   Osteopenia of multiple sites  Other medical conditions are listed as follows:   Dyslipidemia  History of gastroesophageal reflux (GERD)  Plantar fasciitis, bilateral: She has no tenderness at this time.   History of hyperlipidemia    Orders: Orders Placed This Encounter  Procedures  . CBC with Differential/Platelet  . COMPLETE METABOLIC PANEL WITH GFR  . Amylase  . Lipase   No orders of the defined types were placed in this encounter.   Face-to-face time spent with patient was 30 minutes. Greater than 50% of time was spent in counseling and coordination of care.  Follow-Up Instructions: Return in about 5 months (around 10/08/2018) for Rheumatoid arthritis, Osteoarthritis, DDD.   Ofilia Neas, PA-C    I examined and evaluated the patient with Hazel Sams PA.  Patient had no synovitis on my examination today.  Her arthritis seems to be very well controlled on methotrexate 0.6 mL subcu weekly.  She is been emptying experiencing abdominal bloating and discomfort.  She has seen Dr. Daisey Must in the past.  We have advised her to schedule an appointment with Dr. Daisey Must.  The plan of care was discussed as noted above.  Bo Merino, MD  Note - This record has been created using Editor, commissioning.  Chart creation errors have been sought, but may not always  have been located. Such creation errors do not reflect on  the standard of medical care.

## 2018-05-08 ENCOUNTER — Ambulatory Visit: Payer: 59 | Admitting: Rheumatology

## 2018-05-08 ENCOUNTER — Encounter: Payer: Self-pay | Admitting: Physician Assistant

## 2018-05-08 VITALS — BP 125/86 | HR 56 | Resp 12 | Ht 62.0 in | Wt 125.0 lb

## 2018-05-08 DIAGNOSIS — M170 Bilateral primary osteoarthritis of knee: Secondary | ICD-10-CM

## 2018-05-08 DIAGNOSIS — M19042 Primary osteoarthritis, left hand: Secondary | ICD-10-CM

## 2018-05-08 DIAGNOSIS — M5030 Other cervical disc degeneration, unspecified cervical region: Secondary | ICD-10-CM

## 2018-05-08 DIAGNOSIS — R5383 Other fatigue: Secondary | ICD-10-CM | POA: Diagnosis not present

## 2018-05-08 DIAGNOSIS — M0579 Rheumatoid arthritis with rheumatoid factor of multiple sites without organ or systems involvement: Secondary | ICD-10-CM | POA: Diagnosis not present

## 2018-05-08 DIAGNOSIS — F5101 Primary insomnia: Secondary | ICD-10-CM | POA: Diagnosis not present

## 2018-05-08 DIAGNOSIS — Z8719 Personal history of other diseases of the digestive system: Secondary | ICD-10-CM

## 2018-05-08 DIAGNOSIS — M19071 Primary osteoarthritis, right ankle and foot: Secondary | ICD-10-CM

## 2018-05-08 DIAGNOSIS — M19072 Primary osteoarthritis, left ankle and foot: Secondary | ICD-10-CM

## 2018-05-08 DIAGNOSIS — Z79899 Other long term (current) drug therapy: Secondary | ICD-10-CM | POA: Diagnosis not present

## 2018-05-08 DIAGNOSIS — R1013 Epigastric pain: Secondary | ICD-10-CM | POA: Diagnosis not present

## 2018-05-08 DIAGNOSIS — E785 Hyperlipidemia, unspecified: Secondary | ICD-10-CM | POA: Diagnosis not present

## 2018-05-08 DIAGNOSIS — M8589 Other specified disorders of bone density and structure, multiple sites: Secondary | ICD-10-CM

## 2018-05-08 DIAGNOSIS — M722 Plantar fascial fibromatosis: Secondary | ICD-10-CM

## 2018-05-08 DIAGNOSIS — Z8639 Personal history of other endocrine, nutritional and metabolic disease: Secondary | ICD-10-CM

## 2018-05-08 DIAGNOSIS — M19041 Primary osteoarthritis, right hand: Secondary | ICD-10-CM

## 2018-05-08 NOTE — Patient Instructions (Signed)
Standing Labs We placed an order today for your standing lab work.   Please come back and get your standing labs in October and every 3 months   We have open lab Monday through Friday from 8:30-11:30 AM and 1:30-4:00 PM  at the office of Dr. Shaili Deveshwar.   You may experience shorter wait times on Monday and Friday afternoons. The office is located at [address], [address], [place], [place] [address] No appointment is necessary.   Labs are drawn by [facility].  You may receive a bill from [facility] for your lab work. If you have any questions regarding directions or hours of operation,  please call [phone].    

## 2018-05-09 LAB — COMPLETE METABOLIC PANEL WITH GFR
AG Ratio: 1.7 (calc) (ref 1.0–2.5)
ALT: 21 U/L (ref 6–29)
AST: 34 U/L (ref 10–35)
Albumin: 4.2 g/dL (ref 3.6–5.1)
Alkaline phosphatase (APISO): 77 U/L (ref 33–130)
BUN: 18 mg/dL (ref 7–25)
CO2: 29 mmol/L (ref 20–32)
Calcium: 9.4 mg/dL (ref 8.6–10.4)
Chloride: 103 mmol/L (ref 98–110)
Creat: 0.87 mg/dL (ref 0.50–1.05)
GFR, Est African American: 86 mL/min/{1.73_m2} (ref >=60)
GFR, Est Non African American: 74 mL/min/{1.73_m2} (ref >=60)
Globulin: 2.5 g/dL (calc) (ref 1.9–3.7)
Glucose, Bld: 91 mg/dL (ref 65–99)
Potassium: 4.1 mmol/L (ref 3.5–5.3)
Sodium: 139 mmol/L (ref 135–146)
Total Bilirubin: 0.6 mg/dL (ref 0.2–1.2)
Total Protein: 6.7 g/dL (ref 6.1–8.1)

## 2018-05-09 LAB — CBC WITH DIFFERENTIAL/PLATELET
Basophils Absolute: 70 cells/uL (ref 0–200)
Basophils Relative: 1.2 %
Eosinophils Absolute: 197 cells/uL (ref 15–500)
Eosinophils Relative: 3.4 %
HCT: 39.6 % (ref 35.0–45.0)
Hemoglobin: 13.6 g/dL (ref 11.7–15.5)
Lymphs Abs: 1856 cells/uL (ref 850–3900)
MCH: 31.9 pg (ref 27.0–33.0)
MCHC: 34.3 g/dL (ref 32.0–36.0)
MCV: 92.7 fL (ref 80.0–100.0)
MPV: 10 fL (ref 7.5–12.5)
Monocytes Relative: 5.8 %
Neutro Abs: 3341 cells/uL (ref 1500–7800)
Neutrophils Relative %: 57.6 %
Platelets: 273 10*3/uL (ref 140–400)
RBC: 4.27 10*6/uL (ref 3.80–5.10)
RDW: 13.3 % (ref 11.0–15.0)
Total Lymphocyte: 32 %
WBC mixed population: 336 cells/uL (ref 200–950)
WBC: 5.8 10*3/uL (ref 3.8–10.8)

## 2018-05-09 LAB — LIPASE: Lipase: 62 U/L — ABNORMAL HIGH (ref 7–60)

## 2018-05-09 LAB — AMYLASE: Amylase: 82 U/L (ref 21–101)

## 2018-05-09 NOTE — Progress Notes (Signed)
Lipase is borderline elevated. Amylase is WNL.  CBC and CMP WNL. Please forward to PCP.

## 2018-05-14 ENCOUNTER — Other Ambulatory Visit (HOSPITAL_COMMUNITY): Payer: Self-pay | Admitting: Family Medicine

## 2018-05-14 ENCOUNTER — Telehealth: Payer: Self-pay | Admitting: Rheumatology

## 2018-05-14 DIAGNOSIS — R1013 Epigastric pain: Secondary | ICD-10-CM

## 2018-05-14 DIAGNOSIS — R748 Abnormal levels of other serum enzymes: Secondary | ICD-10-CM | POA: Diagnosis not present

## 2018-05-14 NOTE — Telephone Encounter (Signed)
Patient left a voicemail requesting a return call to discuss her medications.

## 2018-05-15 ENCOUNTER — Telehealth: Payer: Self-pay | Admitting: Rheumatology

## 2018-05-15 NOTE — Telephone Encounter (Signed)
Patient called stating that Dr. Kenton Kingfisher prescribed Carafate for her stomach pain.  Patient wanted to check with Dr. Estanislado Pandy before filling the prescription to make sure it is okay to take with her Methotrexate.

## 2018-05-16 ENCOUNTER — Other Ambulatory Visit (HOSPITAL_COMMUNITY): Payer: Self-pay | Admitting: Family Medicine

## 2018-05-16 ENCOUNTER — Ambulatory Visit
Admission: RE | Admit: 2018-05-16 | Discharge: 2018-05-16 | Disposition: A | Discharge status: Home / Self Care | Payer: 59 | Source: Ambulatory Visit | Attending: Family Medicine | Admitting: Family Medicine

## 2018-05-16 DIAGNOSIS — R1013 Epigastric pain: Secondary | ICD-10-CM | POA: Diagnosis not present

## 2018-05-16 NOTE — Telephone Encounter (Signed)
Yes ,  it should be okay for her to take Carafate.

## 2018-05-16 NOTE — Telephone Encounter (Signed)
See previous phone note.  

## 2018-05-16 NOTE — Telephone Encounter (Signed)
Patient advised okay to take Carafate.

## 2018-05-21 ENCOUNTER — Encounter: Payer: Self-pay | Admitting: Rheumatology

## 2018-05-21 MED FILL — SUCRALFATE 1 GM TABLET: 1 | 30 days supply | Qty: 60 | Fill #0

## 2018-05-22 NOTE — Telephone Encounter (Signed)
Call patient back and had a detailed discussion.  Her amylase and lipase are elevated.  Have advised her to stop methotrexate for right now which I do not think is causing her amylase and lipase elevation.  She will need evaluation by gastroenterologist.  She seen Dr. Watt Climes in the past.  Have advised her to contact his office as soon as possible and try to see him urgently.  She will keep [place] posted regarding her follow-up with Dr. Watt Climes.

## 2018-05-23 ENCOUNTER — Ambulatory Visit: Payer: 59 | Admitting: Rheumatology

## 2018-05-24 ENCOUNTER — Encounter: Payer: Self-pay | Admitting: Rheumatology

## 2018-05-25 DIAGNOSIS — R748 Abnormal levels of other serum enzymes: Secondary | ICD-10-CM | POA: Diagnosis not present

## 2018-05-25 DIAGNOSIS — R0789 Other chest pain: Secondary | ICD-10-CM | POA: Diagnosis not present

## 2018-05-25 DIAGNOSIS — K219 Gastro-esophageal reflux disease without esophagitis: Secondary | ICD-10-CM | POA: Diagnosis not present

## 2018-06-04 DIAGNOSIS — K859 Acute pancreatitis without necrosis or infection, unspecified: Secondary | ICD-10-CM | POA: Diagnosis not present

## 2018-06-04 DIAGNOSIS — Z1210 Encounter for screening for malignant neoplasm of intestinal tract, unspecified: Secondary | ICD-10-CM | POA: Diagnosis not present

## 2018-06-04 DIAGNOSIS — K219 Gastro-esophageal reflux disease without esophagitis: Secondary | ICD-10-CM | POA: Diagnosis not present

## 2018-06-05 ENCOUNTER — Telehealth: Payer: Self-pay

## 2018-06-05 NOTE — Telephone Encounter (Signed)
We received office note from Dr. Perley Jain office. After reviewing the note, Dr. Estanislado Pandy would like patient to decrease methotrexate to 0.4mg  once weekly. Patient verbalized understanding. Patient was questioning if she could take pepcid (recommendation of Dr. Watt Climes) with the methotrexate. I discussed this with Dr. Estanislado Pandy and she was in agreement that patient could take the pepcid. Patient verbalized understanding.

## 2018-06-07 ENCOUNTER — Other Ambulatory Visit: Payer: Self-pay | Admitting: Obstetrics and Gynecology

## 2018-06-07 ENCOUNTER — Ambulatory Visit
Admission: RE | Admit: 2018-06-07 | Discharge: 2018-06-07 | Disposition: A | Discharge status: Home / Self Care | Payer: 59 | Source: Ambulatory Visit | Attending: Obstetrics and Gynecology | Admitting: Obstetrics and Gynecology

## 2018-06-07 DIAGNOSIS — Z1231 Encounter for screening mammogram for malignant neoplasm of breast: Secondary | ICD-10-CM | POA: Diagnosis not present

## 2018-06-11 DIAGNOSIS — Z124 Encounter for screening for malignant neoplasm of cervix: Secondary | ICD-10-CM | POA: Diagnosis not present

## 2018-06-11 DIAGNOSIS — Z1231 Encounter for screening mammogram for malignant neoplasm of breast: Secondary | ICD-10-CM | POA: Diagnosis not present

## 2018-06-11 DIAGNOSIS — Z6823 Body mass index (BMI) 23.0-23.9, adult: Secondary | ICD-10-CM | POA: Diagnosis not present

## 2018-06-11 DIAGNOSIS — Z1211 Encounter for screening for malignant neoplasm of colon: Secondary | ICD-10-CM | POA: Diagnosis not present

## 2018-06-11 DIAGNOSIS — Z01419 Encounter for gynecological examination (general) (routine) without abnormal findings: Secondary | ICD-10-CM | POA: Diagnosis not present

## 2018-06-11 DIAGNOSIS — M069 Rheumatoid arthritis, unspecified: Secondary | ICD-10-CM | POA: Diagnosis not present

## 2018-07-06 ENCOUNTER — Telehealth: Payer: Self-pay | Admitting: Rheumatology

## 2018-07-06 ENCOUNTER — Other Ambulatory Visit: Payer: Self-pay | Admitting: Rheumatology

## 2018-07-06 MED ORDER — METHOTREXATE SODIUM CHEMO INJECTION 50 MG/2ML: 15.0000 mg | INTRAMUSCULAR | 0 refills | Status: DC

## 2018-07-06 MED FILL — METHOTREXATE 25 MG/ML VIAL: 50 | 70 days supply | Qty: 6 | Fill #0

## 2018-07-06 NOTE — Telephone Encounter (Signed)
[facility] requesting refill on patients MTX 25mg  multi dose viral. Patient needs it before the weekend if possible.

## 2018-07-06 NOTE — Telephone Encounter (Signed)
Last Visit: 05/08/18 Next Visit: due in December 2019 Labs: 05/08/18 CBC and CMP WNL  Okay to refill per Dr. Rob Hickman

## 2018-07-16 MED FILL — FOLIC ACID 1 MG TABS: 1 | 90 days supply | Qty: 180 | Fill #2

## 2018-08-06 ENCOUNTER — Other Ambulatory Visit: Payer: Self-pay | Admitting: *Deleted

## 2018-08-06 ENCOUNTER — Other Ambulatory Visit: Payer: Self-pay

## 2018-08-06 DIAGNOSIS — Z79899 Other long term (current) drug therapy: Secondary | ICD-10-CM

## 2018-08-06 LAB — COMPLETE METABOLIC PANEL WITH GFR
AG Ratio: 1.7 (calc) (ref 1.0–2.5)
ALT: 9 U/L (ref 6–29)
AST: 17 U/L (ref 10–35)
Albumin: 4.3 g/dL (ref 3.6–5.1)
Alkaline phosphatase (APISO): 73 U/L (ref 33–130)
BUN: 17 mg/dL (ref 7–25)
CO2: 30 mmol/L (ref 20–32)
Calcium: 9.7 mg/dL (ref 8.6–10.4)
Chloride: 106 mmol/L (ref 98–110)
Creat: 0.8 mg/dL (ref 0.50–1.05)
GFR, Est African American: 95 mL/min/{1.73_m2} (ref >=60)
GFR, Est Non African American: 82 mL/min/{1.73_m2} (ref >=60)
Globulin: 2.5 g/dL (calc) (ref 1.9–3.7)
Glucose, Bld: 82 mg/dL (ref 65–99)
Potassium: 5.1 mmol/L (ref 3.5–5.3)
Sodium: 143 mmol/L (ref 135–146)
Total Bilirubin: 0.6 mg/dL (ref 0.2–1.2)
Total Protein: 6.8 g/dL (ref 6.1–8.1)

## 2018-08-06 LAB — CBC WITH DIFFERENTIAL/PLATELET
Basophils Absolute: 82 cells/uL (ref 0–200)
Basophils Relative: 1.2 %
Eosinophils Absolute: 197 cells/uL (ref 15–500)
Eosinophils Relative: 2.9 %
HCT: 40.7 % (ref 35.0–45.0)
Hemoglobin: 13.9 g/dL (ref 11.7–15.5)
Lymphs Abs: 2353 cells/uL (ref 850–3900)
MCH: 31.4 pg (ref 27.0–33.0)
MCHC: 34.2 g/dL (ref 32.0–36.0)
MCV: 91.9 fL (ref 80.0–100.0)
MPV: 9.8 fL (ref 7.5–12.5)
Monocytes Relative: 6.9 %
Neutro Abs: 3699 cells/uL (ref 1500–7800)
Neutrophils Relative %: 54.4 %
Platelets: 248 10*3/uL (ref 140–400)
RBC: 4.43 10*6/uL (ref 3.80–5.10)
RDW: 13 % (ref 11.0–15.0)
Total Lymphocyte: 34.6 %
WBC mixed population: 469 cells/uL (ref 200–950)
WBC: 6.8 10*3/uL (ref 3.8–10.8)

## 2018-08-06 LAB — AMYLASE: Amylase: 85 U/L (ref 21–101)

## 2018-08-06 LAB — LIPASE: Lipase: 74 U/L — ABNORMAL HIGH (ref 7–60)

## 2018-08-06 NOTE — Progress Notes (Signed)
Office Visit Note  Patient: Gina Mitchell             Date of Birth: 1961-10-01           MRN: [id number]             PCP: Shirline Frees, MD Referring: Shirline Frees, MD Visit Date: 08/20/2018 Occupation: @GUAROCC @  Subjective:  Fatigue   History of Present Illness: Gina Mitchell is a 57 y.o. female with history of seropositive rheumatoid arthritis, DDD, and osteoarthritis.  She is on MTX 0.4 ml sq injections once weekly.  She reports that she was evaluated by GI specialist she felt that everything was normal.  She has a CT scan which was normal per patient.  She reports her GI specialist recommended lowering the dose of methotrexate.  She is also been taking probiotics which has been helping with her abdominal bloating.  She has been taking Pepcid as well.  She has not noticed any increased joint pain or joint swelling since lowering her dose of methotrexate.  She denies any joint pain or joint swelling.  She continues to have chronic fatigue.  She states she is also noticed increased hair thinning and is wondering if it is due to methotrexate.  She says she is also noticed a few spots appearing on her forearm he does not know if it is related to methotrexate use.  She states that she does not see a dermatologist.    Activities of Daily Living:  Patient reports morning stiffness for 0 minutes.   Patient Denies nocturnal pain.  Difficulty dressing/grooming: Denies Difficulty climbing stairs: Denies Difficulty getting out of chair: Denies Difficulty using hands for taps, buttons, cutlery, and/or writing: Denies  Review of Systems  Constitutional: Positive for fatigue.  HENT: Positive for mouth sores and mouth dryness. Negative for trouble swallowing, trouble swallowing and nose dryness.   Eyes: Positive for dryness. Negative for pain, redness, itching and visual disturbance.  Respiratory: Negative for cough, hemoptysis, shortness of breath, wheezing and difficulty breathing.     Cardiovascular: Negative for chest pain, palpitations, hypertension and swelling in legs/feet.  Gastrointestinal: Positive for constipation and heartburn. Negative for blood in stool, diarrhea, nausea and vomiting.  Endocrine: Negative for increased urination.  Genitourinary: Negative for painful urination, nocturia and pelvic pain.  Musculoskeletal: Positive for arthralgias, joint pain and joint swelling. Negative for myalgias, muscle weakness, morning stiffness, muscle tenderness and myalgias.  Skin: Positive for rash and hair loss. Negative for color change, pallor, nodules/bumps, skin tightness, ulcers and sensitivity to sunlight.  Allergic/Immunologic: Negative for susceptible to infections.  Neurological: Positive for headaches. Negative for dizziness, light-headedness, numbness, memory loss and weakness.  Hematological: Negative for swollen glands.  Psychiatric/Behavioral: Negative for depressed mood, confusion and sleep disturbance. The patient is not nervous/anxious.     PMFS History:  Patient Active Problem List   Diagnosis Date Noted  . Primary osteoarthritis of both hands 09/08/2017  . Primary osteoarthritis of both feet 09/08/2017  . Primary osteoarthritis of both knees 09/08/2017  . History of gastroesophageal reflux (GERD) 09/08/2017  . Dyslipidemia 09/08/2017  . Osteopenia of multiple sites 09/08/2017  . Right ankle swelling 06/26/2017  . Rheumatoid arthritis ([place]) 06/26/2017  . DDD (degenerative disc disease), cervical 09/13/2016  . Localized primary carpometacarpal osteoarthritis, right 08/26/2016  . Rotator cuff dysfunction, right 08/26/2016  . Metatarsalgia 08/01/2016  . Metatarsal stress fracture of left foot 04/19/2016  . Abnormal EKG 11/06/2014  . Chest pain 06/10/2014  . Plantar  fasciitis, bilateral 11/25/2013    Past Medical History:  Diagnosis Date  . Allergic rhinitis   . Esophageal reflux   . Incomplete right bundle branch block   . Rheumatoid  arthritis ([place])   . Situational anxiety     Family History  Problem Relation Age of Onset  . Prostate cancer Father   . Arrhythmia Father   . Breast cancer Sister   . Hypertension Mother    Past Surgical History:  Procedure Laterality Date  . BREAST EXCISIONAL BIOPSY    . Breast mass removal     Social History   Social History Narrative  . Not on file    Objective: Vital Signs: BP 121/80 (BP Location: Left Arm, Patient Position: Sitting, Cuff Size: Normal)   Pulse 61   Resp 13   Ht 5\' 2"  (1.575 m)   Wt 126 lb 3.2 oz (57.2 kg)   BMI 23.08 kg/m    Physical Exam  Constitutional: She is oriented to person, place, and time. She appears well-developed and well-nourished.  HENT:  Head: Normocephalic and atraumatic.  Eyes: Conjunctivae and EOM are normal.  Neck: Normal range of motion.  Cardiovascular: Normal rate, regular rhythm, normal heart sounds and intact distal pulses.  Pulmonary/Chest: Effort normal and breath sounds normal.  Abdominal: Soft. Bowel sounds are normal.  Lymphadenopathy:    She has no cervical adenopathy.  Neurological: She is alert and oriented to person, place, and time.  Skin: Skin is warm and dry. Capillary refill takes less than 2 seconds.  Psychiatric: She has a normal mood and affect. Her behavior is normal.  Nursing note and vitals reviewed.    Musculoskeletal Exam: C-spine, thoracic spine, lumbar spine good range of motion.  No midline spinal tenderness.  No SI joint tenderness.  Shoulder joints, elbow joints, wrist joints, MCPs, PIPs, DIPs good range of motion no synovitis.  She has complete fist formation bilaterally.  Hip joints, knee joints, ankle joints, MTPs, PIPs, DIPs good range of motion no synovitis.  No warmth or effusion bilateral knee joints.  No tenderness or swelling of ankle joints.  No Achilles tenderness or plantar fasciitis.  CDAI Exam: CDAI Score: 0.2  Patient Global Assessment: 1 (mm); Provider Global Assessment: 1  (mm) Swollen: 0 ; Tender: 0  Joint Exam   Not documented   There is currently no information documented on the homunculus. Go to the Rheumatology activity and complete the homunculus joint exam.  Investigation: No additional findings.  Imaging: No results found.  Recent Labs: Lab Results  Component Value Date   WBC 6.8 08/06/2018   HGB 13.9 08/06/2018   PLT 248 08/06/2018   NA 143 08/06/2018   K 5.1 08/06/2018   CL 106 08/06/2018   CO2 30 08/06/2018   GLUCOSE 82 08/06/2018   BUN 17 08/06/2018   CREATININE 0.80 08/06/2018   BILITOT 0.6 08/06/2018   ALKPHOS 79 06/26/2017   AST 17 08/06/2018   ALT 9 08/06/2018   PROT 6.8 08/06/2018   ALBUMIN 4.1 06/26/2017   CALCIUM 9.7 08/06/2018   GFRAA 95 08/06/2018   QFTBGOLD NEGATIVE 08/14/2017    Speciality Comments: No specialty comments available.  Procedures:  No procedures performed Allergies: Crab [shellfish allergy]   Assessment / Plan:     Visit Diagnoses: Rheumatoid arthritis involving multiple sites with positive rheumatoid factor (HCC) - Positive anti-CCP: She has no synovitis.  She has no joint pain or joint swelling at this time.  She has not had any recent  rheumatoid arthritis flares.  According to the patient her GI specialist recommended lowering the dose of methotrexate to 0.4 mL subcutaneously once weekly.  She states she is also started taking Pepcid as well as probiotics on a daily basis.  She is apprehensive to continue on methotrexate due to being apprehensive of side effects.  She has been experiencing hair thinning, headache, rashes, and GI upset.  No rashes are evident on exam.  She was advised to follow-up with her primary care regarding the increased frequency of headaches.  Her rheumatoid arthritis is very well controlled on methotrexate 0.4 mL subcutaneous a once weekly.  She was advised to let us know if she has increased joint pain or joint swelling in the lower dose of methotrexate.  She will follow-up in  the office in 5 months.  She requested CCP and sed rate to be checked with next lab work.  I placed futures for both labs to be drawn.   High risk medication use - MTX 0.4 ml sq q wk, folic acid 1mg  po qd. CBC and CMP were drawn on 08/06/2018.  She will return for lab work in January and every 3 months to monitor for drug toxicity.  Primary osteoarthritis of both hands: She has no discomfort at this time.  She is complete fist formation bilaterally.  Joint protection and muscle strengthening were discussed.  Primary osteoarthritis of both knees: No warmth or effusion.  She is good range of motion with no discomfort.  Primary osteoarthritis of both feet: No joint pain or joint swelling.  She has good range of motion with no discomfort.  Primary insomnia: Chronic.  Other fatigue: Chronic.  She feels as though methotrexate has been worsening fatigue.  DDD (degenerative disc disease), cervical: She has good range of motion no discomfort.  No symptoms of radiculopathy at this.   Osteopenia of multiple sites: She takes a vitamin D supplement on a daily basis.  Other medical conditions are listed as follows:  History of hyperlipidemia  History of gastroesophageal reflux (GERD)  Plantar fasciitis, bilateral: Resolved.    Orders: Orders Placed This Encounter  Procedures  . Sedimentation rate  . Cyclic citrul peptide antibody, IgG   No orders of the defined types were placed in this encounter.   Face-to-face time spent with patient was 30 minutes. Greater than 50% of time was spent in counseling and coordination of care.  Follow-Up Instructions: Return in about 5 months (around 01/19/2019) for Rheumatoid arthritis, Osteoarthritis.   Ofilia Neas, PA-C   I examined and evaluated the patient with Hazel Sams PA.  Patient had multiple questions today which I answered.  She had no synovitis on examination.  She has some tendinitis in her right ankle.  She does not want to increase the  methotrexate dose.  She will stay on methotrexate 0.4 mL subcu weekly.  The plan of care was discussed as noted above.  Bo Merino, MD  Note - This record has been created using Editor, commissioning.  Chart creation errors have been sought, but may not always  have been located. Such creation errors do not reflect on  the standard of medical care.

## 2018-08-07 NOTE — Progress Notes (Signed)
Lipase is elevated.  Please notify patient and forward the labs to her PCP and GI.

## 2018-08-16 DIAGNOSIS — R748 Abnormal levels of other serum enzymes: Secondary | ICD-10-CM | POA: Diagnosis not present

## 2018-08-20 ENCOUNTER — Encounter: Payer: Self-pay | Admitting: Rheumatology

## 2018-08-20 ENCOUNTER — Ambulatory Visit: Payer: 59 | Admitting: Rheumatology

## 2018-08-20 VITALS — BP 121/80 | HR 61 | Resp 13 | Ht 62.0 in | Wt 126.2 lb

## 2018-08-20 DIAGNOSIS — M722 Plantar fascial fibromatosis: Secondary | ICD-10-CM

## 2018-08-20 DIAGNOSIS — M8589 Other specified disorders of bone density and structure, multiple sites: Secondary | ICD-10-CM

## 2018-08-20 DIAGNOSIS — M19041 Primary osteoarthritis, right hand: Secondary | ICD-10-CM | POA: Diagnosis not present

## 2018-08-20 DIAGNOSIS — M170 Bilateral primary osteoarthritis of knee: Secondary | ICD-10-CM

## 2018-08-20 DIAGNOSIS — M19071 Primary osteoarthritis, right ankle and foot: Secondary | ICD-10-CM | POA: Diagnosis not present

## 2018-08-20 DIAGNOSIS — F5101 Primary insomnia: Secondary | ICD-10-CM

## 2018-08-20 DIAGNOSIS — M19072 Primary osteoarthritis, left ankle and foot: Secondary | ICD-10-CM

## 2018-08-20 DIAGNOSIS — M5030 Other cervical disc degeneration, unspecified cervical region: Secondary | ICD-10-CM | POA: Diagnosis not present

## 2018-08-20 DIAGNOSIS — E785 Hyperlipidemia, unspecified: Secondary | ICD-10-CM

## 2018-08-20 DIAGNOSIS — Z79899 Other long term (current) drug therapy: Secondary | ICD-10-CM | POA: Diagnosis not present

## 2018-08-20 DIAGNOSIS — R5383 Other fatigue: Secondary | ICD-10-CM | POA: Diagnosis not present

## 2018-08-20 DIAGNOSIS — Z8719 Personal history of other diseases of the digestive system: Secondary | ICD-10-CM

## 2018-08-20 DIAGNOSIS — M0579 Rheumatoid arthritis with rheumatoid factor of multiple sites without organ or systems involvement: Secondary | ICD-10-CM | POA: Diagnosis not present

## 2018-08-20 DIAGNOSIS — M19042 Primary osteoarthritis, left hand: Secondary | ICD-10-CM

## 2018-08-20 DIAGNOSIS — Z8639 Personal history of other endocrine, nutritional and metabolic disease: Secondary | ICD-10-CM

## 2018-08-31 ENCOUNTER — Other Ambulatory Visit: Payer: Self-pay | Admitting: Pharmacist

## 2018-08-31 ENCOUNTER — Other Ambulatory Visit: Payer: Self-pay | Admitting: Rheumatology

## 2018-08-31 DIAGNOSIS — M0579 Rheumatoid arthritis with rheumatoid factor of multiple sites without organ or systems involvement: Secondary | ICD-10-CM

## 2018-08-31 MED ORDER — METHOTREXATE SODIUM CHEMO INJECTION 50 MG/2ML: 10.0000 mg | INTRAMUSCULAR | 0 refills | Status: DC

## 2018-08-31 MED FILL — METHOTREXATE 25 MG/ML VIAL: 50 | 70 days supply | Qty: 4 | Fill #0

## 2018-08-31 NOTE — Progress Notes (Signed)
Pharmacy called for methotrexate refill.   Last labs: 08/06/18 Last OV: 08/20/18  90 day supply sent to [facility].

## 2018-09-18 ENCOUNTER — Telehealth: Payer: Self-pay | Admitting: *Deleted

## 2018-09-18 DIAGNOSIS — H524 Presbyopia: Secondary | ICD-10-CM | POA: Diagnosis not present

## 2018-09-18 DIAGNOSIS — H5213 Myopia, bilateral: Secondary | ICD-10-CM | POA: Diagnosis not present

## 2018-09-18 NOTE — Telephone Encounter (Signed)
Patient states she saw her eye doctor today. Patient states she was given a prescription for antibiotics. Patient states she was prescribed Doxycycline BID for 10 days then reduce to 1 daily. Patient states she is to follow up with eye doctor in 3 months. Patient states she was also given another option. Patient advised to have the eye doctor fax notes from today's visit so Dr. Estanislado Pandy can review and make recommendations.

## 2018-09-20 ENCOUNTER — Telehealth: Payer: Self-pay | Admitting: Rheumatology

## 2018-09-20 NOTE — Telephone Encounter (Signed)
Left message to advise patient we have not received a fax from the eye doctor.

## 2018-09-20 NOTE — Telephone Encounter (Signed)
Patient calling to see if you got faxed info from her eye doctor. Please call to advise.

## 2018-09-20 NOTE — Telephone Encounter (Signed)
Patient advised we received the notes from the eye doctor. Patient advised the eye doctor recommends the Doxycycline. Patient advised Dr. Estanislado Pandy wants her to hold MTX until she has completed the antibiotics. Patient verbalized understanding.

## 2018-09-21 MED FILL — DOXYCYCLINE HYC 50 MG CAP: 50 | 30 days supply | Qty: 40 | Fill #0

## 2018-10-05 DIAGNOSIS — R748 Abnormal levels of other serum enzymes: Secondary | ICD-10-CM | POA: Diagnosis not present

## 2018-10-05 DIAGNOSIS — R42 Dizziness and giddiness: Secondary | ICD-10-CM | POA: Diagnosis not present

## 2018-10-10 IMAGING — CR DG SHOULDER 2+V*R*
3 series · 3 of 3 positions shown · non-contrast
Comparison: 09/07/2016 left shoulder

CLINICAL DATA: Bilateral shoulder pain and lower neck cervical pain
for several weeks.

EXAM:
RIGHT SHOULDER - 2+ VIEW

[w shoulder ap external righ]
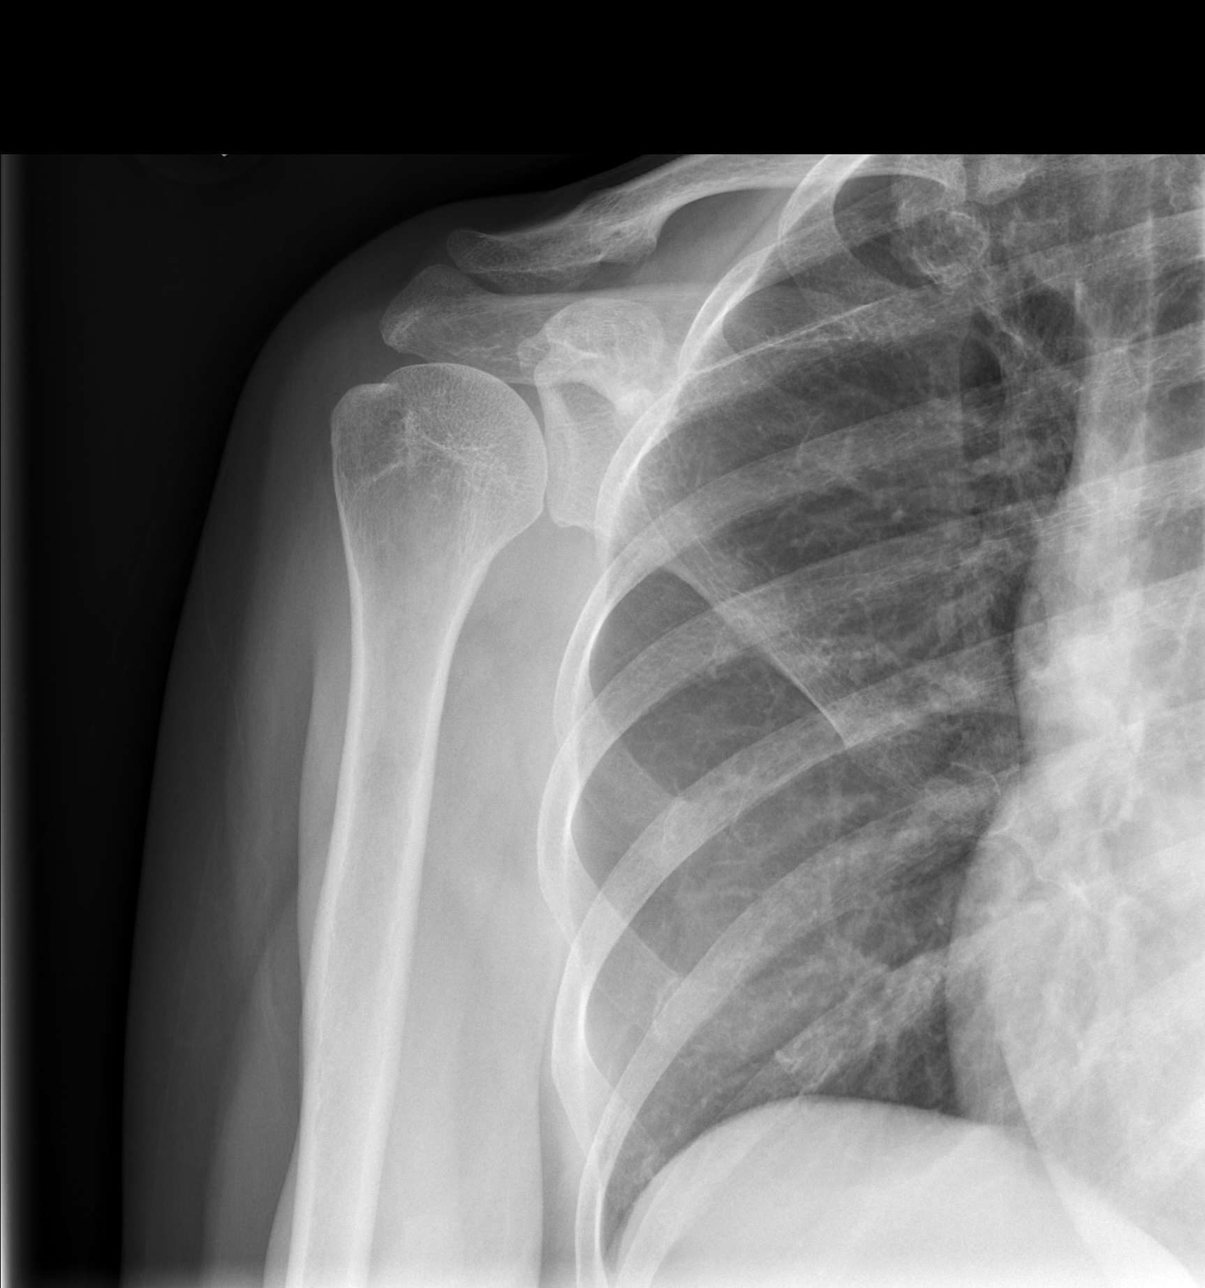

[w shoulder y view right]
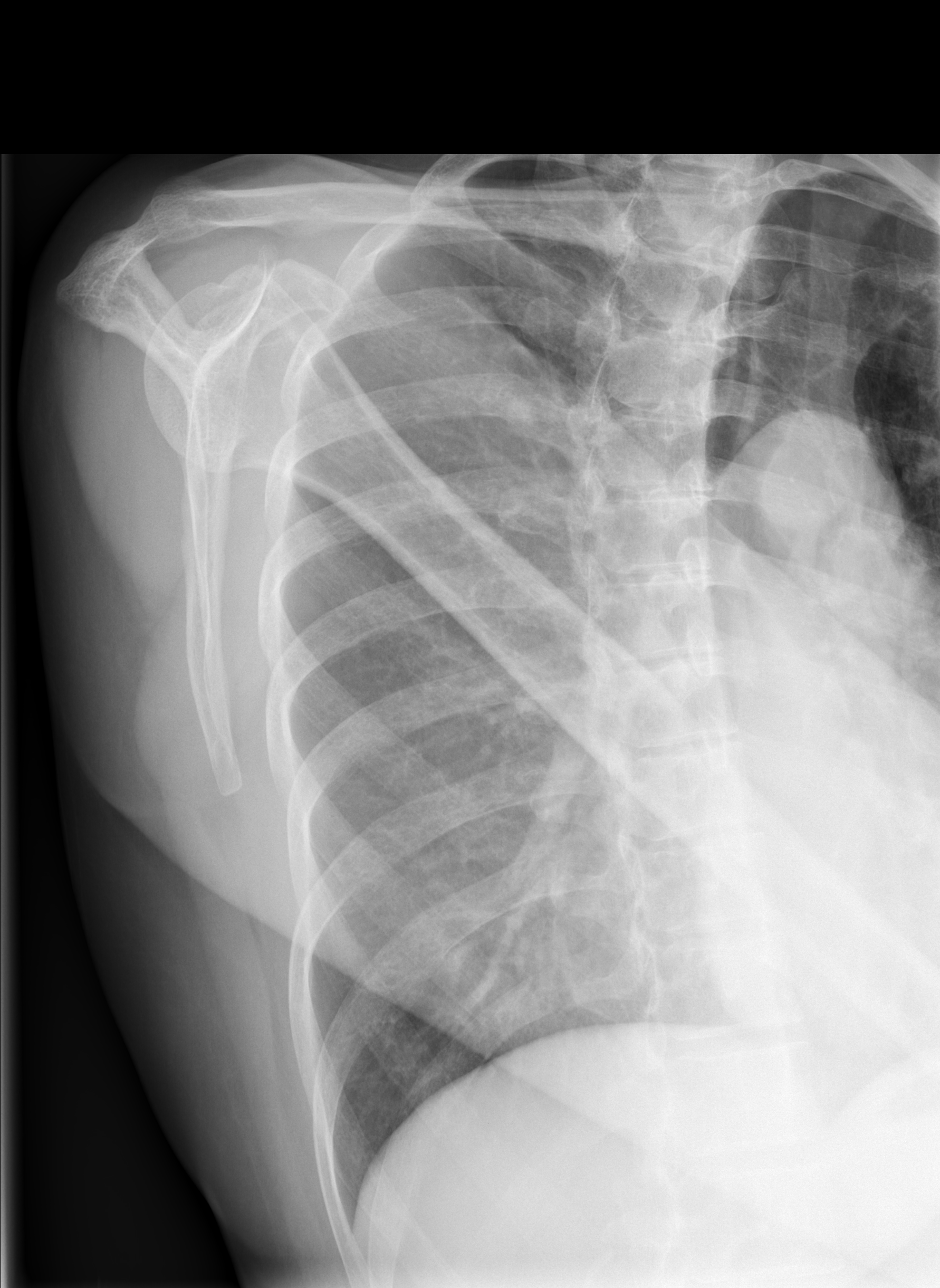

[w shoulder axillary right]
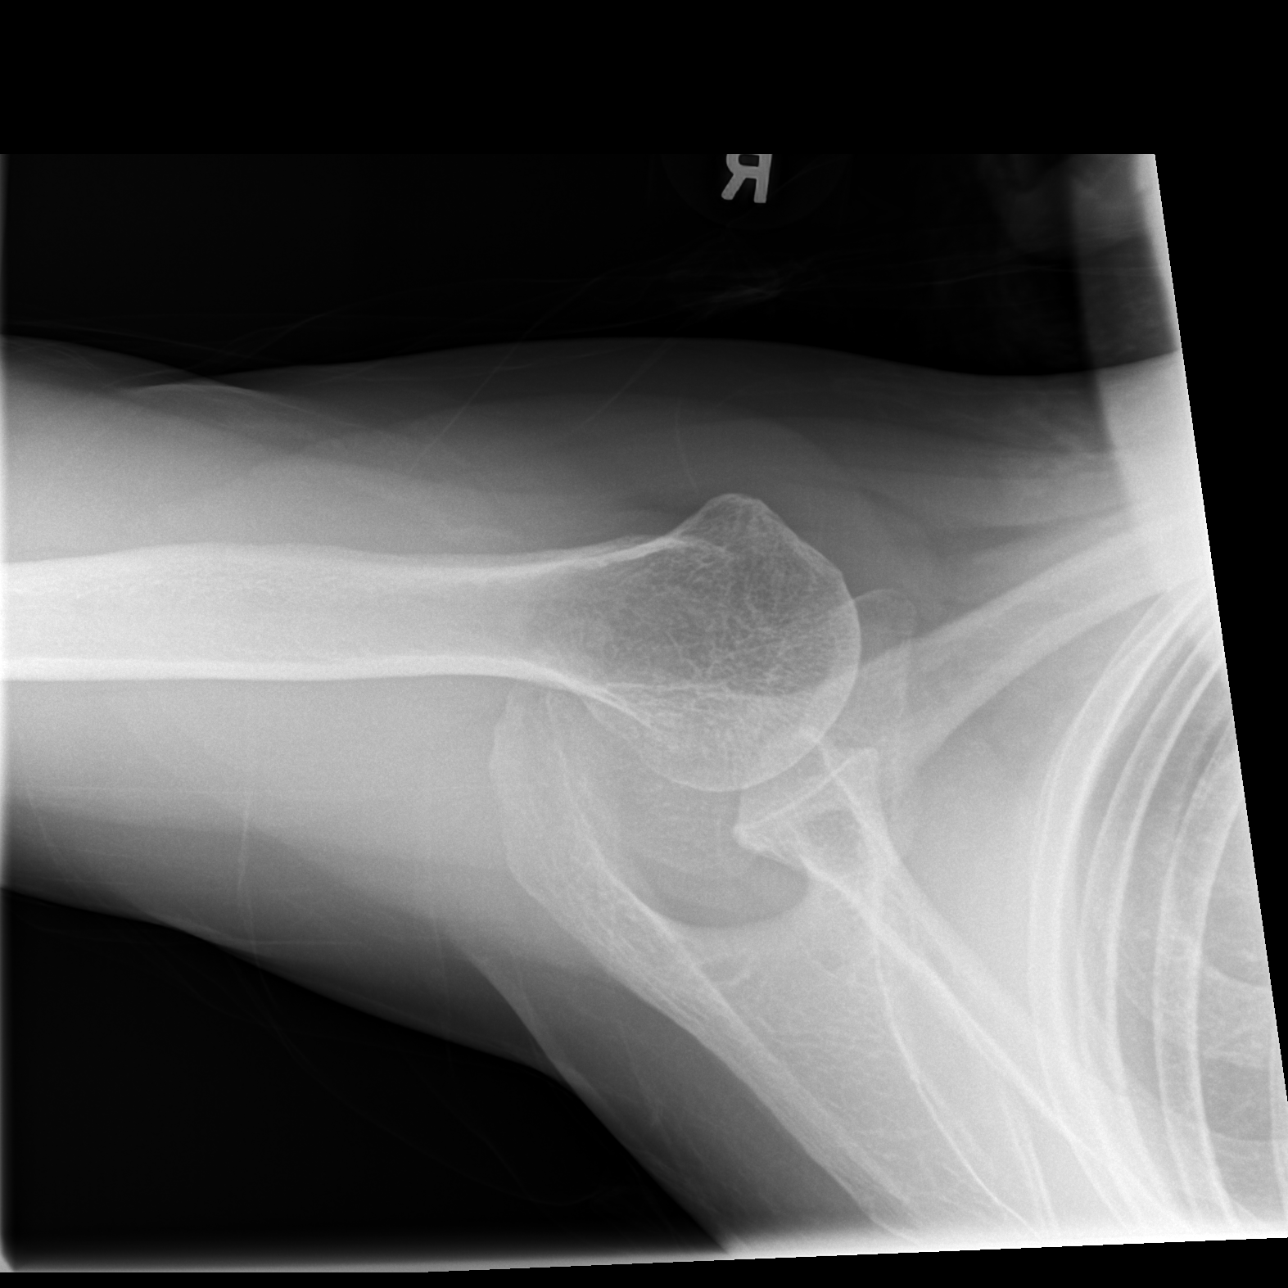

[3 of 3 positions shown; findings below may reference images not displayed]

FINDINGS: There is no evidence of fracture or dislocation. There is no
evidence of arthropathy or other focal bone abnormality. Soft
tissues are unremarkable.
IMPRESSION: Negative.

## 2018-10-15 NOTE — Progress Notes (Signed)
Office Visit Note  Patient: Gina Mitchell             Date of Birth: 1960/11/21           MRN: [id number]             PCP: Shirline Frees, MD Referring: Shirline Frees, MD Visit Date: 10/16/2018 Occupation: @GUAROCC @  Subjective:  Dry eyes and blurred vision.   History of Present Illness: Gina Mitchell is a 57 y.o. female story of seropositive rheumatoid arthritis and osteoarthritis overlap.  Patient states that about a month ago she developed dry eyes and inflammation in her eyes.  She was seen by ophthalmologist who diagnosed her with dry eye syndrome and clogged milk duct.  She states she was placed on doxycycline.  She stopped taking methotrexate on November 16.  She denies any increased joint pain or joint swelling.  She states she continues to have some blurred vision and also some discomfort in her eyes.  Activities of Daily Living:  Patient reports morning stiffness for 2 minutes.   Patient Denies nocturnal pain.  Difficulty dressing/grooming: Denies Difficulty climbing stairs: Denies Difficulty getting out of chair: Denies Difficulty using hands for taps, buttons, cutlery, and/or writing: Denies  Review of Systems  Constitutional: Positive for fatigue. Negative for night sweats, weight gain and weight loss.  HENT: Negative for mouth sores, trouble swallowing, trouble swallowing, mouth dryness and nose dryness.   Eyes: Positive for dryness. Negative for pain, redness and visual disturbance.  Respiratory: Negative for cough, shortness of breath and difficulty breathing.   Cardiovascular: Negative for chest pain, palpitations, hypertension, irregular heartbeat and swelling in legs/feet.  Gastrointestinal: Positive for constipation. Negative for blood in stool and diarrhea.  Endocrine: Negative for increased urination.  Genitourinary: Negative for vaginal dryness.  Musculoskeletal: Positive for morning stiffness. Negative for arthralgias, joint pain, joint swelling,  myalgias, muscle weakness, muscle tenderness and myalgias.  Skin: Negative for color change, rash, hair loss, skin tightness, ulcers and sensitivity to sunlight.  Allergic/Immunologic: Negative for susceptible to infections.  Neurological: Negative for dizziness, memory loss, night sweats and weakness.  Hematological: Negative for swollen glands.  Psychiatric/Behavioral: Positive for sleep disturbance. Negative for depressed mood. The patient is not nervous/anxious.     PMFS History:  Patient Active Problem List   Diagnosis Date Noted  . Primary osteoarthritis of both hands 09/08/2017  . Primary osteoarthritis of both feet 09/08/2017  . Primary osteoarthritis of both knees 09/08/2017  . History of gastroesophageal reflux (GERD) 09/08/2017  . Dyslipidemia 09/08/2017  . Osteopenia of multiple sites 09/08/2017  . Right ankle swelling 06/26/2017  . Rheumatoid arthritis ([place]) 06/26/2017  . DDD (degenerative disc disease), cervical 09/13/2016  . Localized primary carpometacarpal osteoarthritis, right 08/26/2016  . Rotator cuff dysfunction, right 08/26/2016  . Metatarsalgia 08/01/2016  . Metatarsal stress fracture of left foot 04/19/2016  . Abnormal EKG 11/06/2014  . Chest pain 06/10/2014  . Plantar fasciitis, bilateral 11/25/2013    Past Medical History:  Diagnosis Date  . Allergic rhinitis   . Esophageal reflux   . Incomplete right bundle branch block   . Rheumatoid arthritis ([place])   . Situational anxiety     Family History  Problem Relation Age of Onset  . Prostate cancer Father   . Arrhythmia Father   . Breast cancer Sister   . Hypertension Mother    Past Surgical History:  Procedure Laterality Date  . BREAST EXCISIONAL BIOPSY    . Breast mass removal  Social History   Social History Narrative  . Not on file    Objective: Vital Signs: BP 129/81 (BP Location: Left Arm, Patient Position: Sitting, Cuff Size: Normal)   Pulse 70   Resp 12   Ht 5\' 2"  (1.575 m)    Wt 125 lb (56.7 kg)   BMI 22.86 kg/m    Physical Exam Vitals signs and nursing note reviewed.  Constitutional:      Appearance: She is well-developed.  HENT:     Head: Normocephalic and atraumatic.  Eyes:     Conjunctiva/sclera: Conjunctivae normal.  Neck:     Musculoskeletal: Normal range of motion.  Cardiovascular:     Rate and Rhythm: Normal rate and regular rhythm.     Heart sounds: Normal heart sounds.  Pulmonary:     Effort: Pulmonary effort is normal.     Breath sounds: Normal breath sounds.  Abdominal:     General: Bowel sounds are normal.     Palpations: Abdomen is soft.  Lymphadenopathy:     Cervical: No cervical adenopathy.  Skin:    General: Skin is warm and dry.     Capillary Refill: Capillary refill takes less than 2 seconds.  Neurological:     Mental Status: She is alert and oriented to person, place, and time.  Psychiatric:        Behavior: Behavior normal.      Musculoskeletal Exam: C-spine thoracic lumbar spine good range of motion.  Shoulder joints elbow joints wrist joint MCPs PIPs DIPs been good range of motion.  She has some PIP and DIP thickening due to underlying osteoarthritis.  She had tenderness on palpation of her right CMC joint.  Knee joints ankle joints MTPs PIPs DIPs were in good range of motion with no synovitis.  CDAI Exam: CDAI Score: 0.3  Patient Global Assessment: 2 (mm); Provider Global Assessment: 1 (mm) Swollen: 0 ; Tender: 0  Joint Exam   Not documented     Investigation: No additional findings.  Imaging: No results found.  Recent Labs: Lab Results  Component Value Date   WBC 6.8 08/06/2018   HGB 13.9 08/06/2018   PLT 248 08/06/2018   NA 143 08/06/2018   K 5.1 08/06/2018   CL 106 08/06/2018   CO2 30 08/06/2018   GLUCOSE 82 08/06/2018   BUN 17 08/06/2018   CREATININE 0.80 08/06/2018   BILITOT 0.6 08/06/2018   ALKPHOS 79 06/26/2017   AST 17 08/06/2018   ALT 9 08/06/2018   PROT 6.8 08/06/2018   ALBUMIN 4.1  06/26/2017   CALCIUM 9.7 08/06/2018   GFRAA 95 08/06/2018   QFTBGOLD NEGATIVE 08/14/2017    Speciality Comments: No specialty comments available.  Procedures:  No procedures performed Allergies: Crab [shellfish allergy]   Assessment / Plan:     Visit Diagnoses: Rheumatoid arthritis involving multiple sites with positive rheumatoid factor (HCC) - Positive anti-CCP -patient has no synovitis on examination today.  She had stopped methotrexate in November.  She does not want to start on methotrexate at this point.  We had detailed discussion regarding monitoring disease process closely.  If she has recurrence of symptoms she should restart methotrexate.  Plan: Cyclic citrul peptide antibody, IgG, Sedimentation rate, Rheumatoid factor  High risk medication use -patient has been off MTX 0.4 ml sq q wk, folic acid 1mg  po qd since November 16.  She will hold off medication for right now.  Dry eyes-we had detailed discussion regarding dry eyes.  Dry eyes could be  a manifestation of rheumatoid arthritis or could be the aging process.  I have advised her to follow-up with the ophthalmologist and discuss this further.  Over-the-counter products were discussed.  Primary osteoarthritis of both hands-joint protection muscle strengthening was discussed.  Primary osteoarthritis of both knees-she is currently not having much discomfort.  Primary osteoarthritis of both feet-no synovitis was noted.  She usually has ankle joint thickening which is resolved.  DDD (degenerative disc disease), cervical-she has good range of motion without discomfort.  Other fatigue-secondary to insomnia.  Primary insomnia-good sleep hygiene was discussed.  Osteopenia of multiple sites-use of calcium, vitamin D and resistive exercises was discussed.  History of hyperlipidemia  History of gastroesophageal reflux (GERD)   Orders: Orders Placed This Encounter  Procedures  . Rheumatoid factor   No orders of the defined  types were placed in this encounter.   Face-to-face time spent with patient was 30 minutes. Greater than 50% of time was spent in counseling and coordination of care.  Follow-Up Instructions: Return in about 5 months (around 03/17/2019) for Rheumatoid arthritis, Osteoarthritis.   Bo Merino, MD  Note - This record has been created using Editor, commissioning.  Chart creation errors have been sought, but may not always  have been located. Such creation errors do not reflect on  the standard of medical care.

## 2018-10-16 ENCOUNTER — Ambulatory Visit: Payer: 59 | Admitting: Rheumatology

## 2018-10-16 ENCOUNTER — Encounter: Payer: Self-pay | Admitting: Rheumatology

## 2018-10-16 VITALS — BP 129/81 | HR 70 | Resp 12 | Ht 62.0 in | Wt 125.0 lb

## 2018-10-16 DIAGNOSIS — Z8639 Personal history of other endocrine, nutritional and metabolic disease: Secondary | ICD-10-CM

## 2018-10-16 DIAGNOSIS — M8589 Other specified disorders of bone density and structure, multiple sites: Secondary | ICD-10-CM | POA: Diagnosis not present

## 2018-10-16 DIAGNOSIS — M19042 Primary osteoarthritis, left hand: Secondary | ICD-10-CM

## 2018-10-16 DIAGNOSIS — M19071 Primary osteoarthritis, right ankle and foot: Secondary | ICD-10-CM | POA: Diagnosis not present

## 2018-10-16 DIAGNOSIS — H04123 Dry eye syndrome of bilateral lacrimal glands: Secondary | ICD-10-CM

## 2018-10-16 DIAGNOSIS — Z79899 Other long term (current) drug therapy: Secondary | ICD-10-CM

## 2018-10-16 DIAGNOSIS — F5101 Primary insomnia: Secondary | ICD-10-CM | POA: Diagnosis not present

## 2018-10-16 DIAGNOSIS — M5030 Other cervical disc degeneration, unspecified cervical region: Secondary | ICD-10-CM | POA: Diagnosis not present

## 2018-10-16 DIAGNOSIS — R5383 Other fatigue: Secondary | ICD-10-CM

## 2018-10-16 DIAGNOSIS — M19072 Primary osteoarthritis, left ankle and foot: Secondary | ICD-10-CM

## 2018-10-16 DIAGNOSIS — M170 Bilateral primary osteoarthritis of knee: Secondary | ICD-10-CM

## 2018-10-16 DIAGNOSIS — Z8719 Personal history of other diseases of the digestive system: Secondary | ICD-10-CM

## 2018-10-16 DIAGNOSIS — M0579 Rheumatoid arthritis with rheumatoid factor of multiple sites without organ or systems involvement: Secondary | ICD-10-CM | POA: Diagnosis not present

## 2018-10-16 DIAGNOSIS — M19041 Primary osteoarthritis, right hand: Secondary | ICD-10-CM | POA: Diagnosis not present

## 2018-10-17 LAB — CYCLIC CITRUL PEPTIDE ANTIBODY, IGG: Cyclic Citrullin Peptide Ab: 57 UNITS — ABNORMAL HIGH

## 2018-10-17 LAB — RHEUMATOID FACTOR: Rhuematoid fact SerPl-aCnc: 25 IU/mL — ABNORMAL HIGH (ref <14)

## 2018-10-17 LAB — SEDIMENTATION RATE: Sed Rate: 6 mm/h (ref 0–30)

## 2018-10-17 NOTE — Progress Notes (Signed)
RF is positive. Sed rate WNL.

## 2018-10-17 NOTE — Progress Notes (Signed)
CCP is positive.

## 2018-10-19 DIAGNOSIS — H0100A Unspecified blepharitis right eye, upper and lower eyelids: Secondary | ICD-10-CM | POA: Diagnosis not present

## 2018-10-19 DIAGNOSIS — H04123 Dry eye syndrome of bilateral lacrimal glands: Secondary | ICD-10-CM | POA: Diagnosis not present

## 2018-10-19 DIAGNOSIS — H0100B Unspecified blepharitis left eye, upper and lower eyelids: Secondary | ICD-10-CM | POA: Diagnosis not present

## 2018-10-26 DIAGNOSIS — J309 Allergic rhinitis, unspecified: Secondary | ICD-10-CM | POA: Diagnosis not present

## 2018-10-26 DIAGNOSIS — M79672 Pain in left foot: Secondary | ICD-10-CM | POA: Diagnosis not present

## 2018-10-26 DIAGNOSIS — M06042 Rheumatoid arthritis without rheumatoid factor, left hand: Secondary | ICD-10-CM | POA: Diagnosis not present

## 2018-10-26 DIAGNOSIS — R42 Dizziness and giddiness: Secondary | ICD-10-CM | POA: Diagnosis not present

## 2018-11-02 NOTE — Progress Notes (Signed)
Office Visit Note  Patient: Gina Mitchell             Date of Birth: 11-Feb-1961           MRN: [id number]             PCP: Shirline Frees, MD Referring: Shirline Frees, MD Visit Date: 11/05/2018 Occupation: @GUAROCC @  Subjective:  Left foot pain   History of Present Illness: Gina Mitchell is a 58 y.o. female with history of seropositive rheumatoid arthritis, DDD, and osteoarthritis.  She discontinued MTX on 09/16/18 due to being apprehensive of side effects.  She has been having pain and swelling in the left foot for several weeks.  She has pain along all the MTPs and left ankle.  She describes the pain as burning in the MTPs.  She has pain weight bearing.  She states the pain has been causing her to limp.  She reports that take Motrin helps with pain.  She has been having increased stiffness in both hands, but denies any pain or swelling at this time.   Activities of Daily Living:  Patient reports morning stiffness for 10-15 minutes.   Patient Reports nocturnal pain.  Difficulty dressing/grooming: Denies Difficulty climbing stairs: Reports Difficulty getting out of chair: Denies Difficulty using hands for taps, buttons, cutlery, and/or writing: Denies  Review of Systems  Constitutional: Negative for fatigue.  HENT: Positive for mouth dryness. Negative for mouth sores and nose dryness.   Eyes: Positive for dryness (Systane drops ). Negative for pain and visual disturbance.  Respiratory: Negative for cough, hemoptysis, shortness of breath and difficulty breathing.   Cardiovascular: Negative for chest pain, palpitations, hypertension and swelling in legs/feet.  Gastrointestinal: Negative for blood in stool, constipation and diarrhea.  Endocrine: Negative for increased urination.  Genitourinary: Negative for painful urination.  Musculoskeletal: Positive for arthralgias, joint pain, joint swelling and morning stiffness. Negative for myalgias, muscle weakness, muscle tenderness  and myalgias.  Skin: Negative for color change, pallor, rash, hair loss, nodules/bumps, skin tightness, ulcers and sensitivity to sunlight.  Allergic/Immunologic: Negative for susceptible to infections.  Neurological: Negative for dizziness, numbness, headaches and weakness.  Hematological: Negative for swollen glands.  Psychiatric/Behavioral: Negative for depressed mood and sleep disturbance. The patient is not nervous/anxious.     PMFS History:  Patient Active Problem List   Diagnosis Date Noted  . Primary osteoarthritis of both hands 09/08/2017  . Primary osteoarthritis of both feet 09/08/2017  . Primary osteoarthritis of both knees 09/08/2017  . History of gastroesophageal reflux (GERD) 09/08/2017  . Dyslipidemia 09/08/2017  . Osteopenia of multiple sites 09/08/2017  . Right ankle swelling 06/26/2017  . Rheumatoid arthritis ([place]) 06/26/2017  . DDD (degenerative disc disease), cervical 09/13/2016  . Localized primary carpometacarpal osteoarthritis, right 08/26/2016  . Rotator cuff dysfunction, right 08/26/2016  . Metatarsalgia 08/01/2016  . Metatarsal stress fracture of left foot 04/19/2016  . Abnormal EKG 11/06/2014  . Chest pain 06/10/2014  . Plantar fasciitis, bilateral 11/25/2013    Past Medical History:  Diagnosis Date  . Allergic rhinitis   . Esophageal reflux   . Incomplete right bundle branch block   . Rheumatoid arthritis ([place])   . Situational anxiety     Family History  Problem Relation Age of Onset  . Prostate cancer Father   . Arrhythmia Father   . Breast cancer Sister   . Hypertension Mother    Past Surgical History:  Procedure Laterality Date  . BREAST EXCISIONAL BIOPSY    .  Breast mass removal     Social History   Social History Narrative  . Not on file    Objective: Vital Signs: BP 119/81 (BP Location: Left Arm, Patient Position: Sitting, Cuff Size: Normal)   Pulse 64   Resp 12   Ht 5\' 2"  (1.575 m)   Wt 125 lb 9.6 oz (57 kg)   BMI 22.97  kg/m    Physical Exam Vitals signs and nursing note reviewed.  Constitutional:      Appearance: She is well-developed.  HENT:     Head: Normocephalic and atraumatic.  Eyes:     Conjunctiva/sclera: Conjunctivae normal.  Neck:     Musculoskeletal: Normal range of motion.  Cardiovascular:     Rate and Rhythm: Normal rate and regular rhythm.     Heart sounds: Normal heart sounds.  Pulmonary:     Effort: Pulmonary effort is normal.     Breath sounds: Normal breath sounds.  Abdominal:     General: Bowel sounds are normal.     Palpations: Abdomen is soft.  Lymphadenopathy:     Cervical: No cervical adenopathy.  Skin:    General: Skin is warm and dry.     Capillary Refill: Capillary refill takes less than 2 seconds.  Neurological:     Mental Status: She is alert and oriented to person, place, and time.  Psychiatric:        Behavior: Behavior normal.      Musculoskeletal Exam: C-spine, thoracic spine, and lumbar spine good ROM.  No midline spinal tenderness.  No SI joint tenderness.  Shoulder joints, elbow joints, wrist joints, MCPs, PIPs, and DIPs good ROM with no synovitis.  Complete fist formation bilaterally.  Hip joints good ROM with no discomfort.  No tenderness over trochanteric bursa bilaterally.  Knee joints good ROM with no warmth or effusion noted.  Left ankle joint tenderness.  Tenderness of all right MTP joints.    CDAI Exam: CDAI Score: Not documented Patient Global Assessment: Not documented; Provider Global Assessment: Not documented Swollen: 0 ; Tender: 6  Joint Exam      Right  Left  Ankle      Tender  MTP 1      Tender  MTP 2      Tender  MTP 3      Tender  MTP 4      Tender  MTP 5      Tender     Investigation: No additional findings.  Imaging: No results found.  Recent Labs: Lab Results  Component Value Date   WBC 6.8 08/06/2018   HGB 13.9 08/06/2018   PLT 248 08/06/2018   NA 143 08/06/2018   K 5.1 08/06/2018   CL 106 08/06/2018   CO2 30  08/06/2018   GLUCOSE 82 08/06/2018   BUN 17 08/06/2018   CREATININE 0.80 08/06/2018   BILITOT 0.6 08/06/2018   ALKPHOS 79 06/26/2017   AST 17 08/06/2018   ALT 9 08/06/2018   PROT 6.8 08/06/2018   ALBUMIN 4.1 06/26/2017   CALCIUM 9.7 08/06/2018   GFRAA 95 08/06/2018   QFTBGOLD NEGATIVE 08/14/2017    Speciality Comments: No specialty comments available.  Procedures:  No procedures performed Allergies: Crab [shellfish allergy]   Assessment / Plan:     Visit Diagnoses: Rheumatoid arthritis involving multiple sites with positive rheumatoid factor (HCC) - +CCP: She has tenderness of all MTPs of the right foot.  She has been having increased pain in the right ankle and all  MTPs, of the right foot for several weeks.  She discontinued MTX on 09/16/18.  She has been taking Motrin which provides temporary.  She has been having pain at night and pain with bearing weight.  She has also noticed increased stiffness in both hands.  A x-ray of the left foot was obtained today.  She will restart on MTX 0.4 ml sq once weekly and folic acid 1 mg daily.  A refill of MTX was sent to the pharmacy. A prednisone taper starting at 20 mg and tapering by 5 mg every 4 days will be sent to the pharmacy. She was advised to notify us if she develops increased joint pain or joint swelling.  She will follow up in 3 months. - Plan: methotrexate 50 MG/2ML injection  High risk medication use - d/c MTX on 09/15/18 due to worrying about SE. She was advised to restart on MTX 0.4 ml sq once weekly due to developing a flare in the left foot.    Primary osteoarthritis of both hands: She has no synovitis. She has complete fist formation bilaterally.  She has been having increased stiffness in both hands.  Joint protection and muscle strengthening were discussed.   Primary osteoarthritis of both knees: No warmth or effusion.  Good ROM with no discomfort.   Primary osteoarthritis of both feet: She has tenderness of all MTPs of the  left foot and left ankle joint.  A x-ray of the left foot was obtained today.  She has no tenderness or discomfort of the right foot.   DDD (degenerative disc disease), cervical: She has good ROM with no discomfort.  She has no symptoms of radiculopathy at this time.    Pain in left ankle and joints of left foot -She has been having increased pain in the left foot and left ankle joint for the past several weeks.  Her last dose of MTX was on 09/16/18.  She has tenderness of all MTPs of the left foot and tenderness of the left ankle.  A x-ray of the left foot was obtained today which revealed a possible erosion of the head of the 3rd MTP.  She was advised to resume MTX 0.4 ml sq once weekly and folic acid 1 mg po daily.  A prednisone taper was sent to the pharmacy. Plan: XR Foot Complete Left   Other medical conditions are listed as follows:   Other fatigue  Primary insomnia  Osteopenia of multiple sites  History of hyperlipidemia  History of gastroesophageal reflux (GERD)  Dry eyes  Plantar fasciitis, bilateral  Epigastric pain  Dyslipidemia  Rheumatoid arthritis involving multiple sites with positive rheumatoid factor ([place]) - Plan: methotrexate 50 MG/2ML injection    Orders: Orders Placed This Encounter  Procedures  . XR Foot Complete Left   Meds ordered this encounter  Medications  . predniSONE (DELTASONE) 5 MG tablet    Sig: Take 4 tablets by mouth daily x4 days, 3 tablets by mouth once daily x4days, 2 tablets by mouth daily x4days, 1 tablet by mouth daily x4days    Dispense:  40 tablet    Refill:  0  . methotrexate 50 MG/2ML injection    Sig: Inject 0.4 mLs (10 mg total) into the skin once a week. Please dispense 2 mL vial with perservatives    Dispense:  6 mL    Refill:  0    Face-to-face time spent with patient was 30 minutes. Greater than 50% of time was spent in counseling and coordination  of care.  Follow-Up Instructions: Return in about 3 months (around  02/04/2019) for Rheumatoid arthritis.   Ofilia Neas, PA-C   I examined and evaluated the patient with Hazel Sams PA.  X-ray of the left foot was unremarkable except for possible erosion in the left third MTP joint.  Patient is having a flare of rheumatoid arthritis.  She is done well on methotrexate 0.4 mL subcu in the past.  She will call in the same dosing at this point.  She was also given a prednisone taper as she was having a flare.  The plan of care was discussed as noted above.  Bo Merino, MD  Note - This record has been created using Editor, commissioning.  Chart creation errors have been sought, but may not always  have been located. Such creation errors do not reflect on  the standard of medical care.

## 2018-11-02 NOTE — Progress Notes (Deleted)
Office Visit Note  Patient: Gina Mitchell             Date of Birth: 05-09-1961           MRN: [id number]             PCP: Shirline Frees, MD Referring: Shirline Frees, MD Visit Date: 11/06/2018 Occupation: @GUAROCC @  Subjective:  No chief complaint on file.  Methotrexate.  Most recent CBC/CMP within normal limits on 08/06/2018.  Due for CBC/CMP today and will monitor every 3 months.  Standing orders placed. Recommend annual influenza, Pneumovax 23, Prevnar 13, and Shingrix as indicated.  History of Present Illness: Gina Mitchell is a 58 y.o. female ***   Activities of Daily Living:  Patient reports morning stiffness for *** {minute/hour:19697}.   Patient {ACTIONS;DENIES/REPORTS:21021675::"Denies"} nocturnal pain.  Difficulty dressing/grooming: {ACTIONS;DENIES/REPORTS:21021675::"Denies"} Difficulty climbing stairs: {ACTIONS;DENIES/REPORTS:21021675::"Denies"} Difficulty getting out of chair: {ACTIONS;DENIES/REPORTS:21021675::"Denies"} Difficulty using hands for taps, buttons, cutlery, and/or writing: {ACTIONS;DENIES/REPORTS:21021675::"Denies"}  No Rheumatology ROS completed.   PMFS History:  Patient Active Problem List   Diagnosis Date Noted  . Primary osteoarthritis of both hands 09/08/2017  . Primary osteoarthritis of both feet 09/08/2017  . Primary osteoarthritis of both knees 09/08/2017  . History of gastroesophageal reflux (GERD) 09/08/2017  . Dyslipidemia 09/08/2017  . Osteopenia of multiple sites 09/08/2017  . Right ankle swelling 06/26/2017  . Rheumatoid arthritis ([place]) 06/26/2017  . DDD (degenerative disc disease), cervical 09/13/2016  . Localized primary carpometacarpal osteoarthritis, right 08/26/2016  . Rotator cuff dysfunction, right 08/26/2016  . Metatarsalgia 08/01/2016  . Metatarsal stress fracture of left foot 04/19/2016  . Abnormal EKG 11/06/2014  . Chest pain 06/10/2014  . Plantar fasciitis, bilateral 11/25/2013    Past Medical History:    Diagnosis Date  . Allergic rhinitis   . Esophageal reflux   . Incomplete right bundle branch block   . Rheumatoid arthritis ([place])   . Situational anxiety     Family History  Problem Relation Age of Onset  . Prostate cancer Father   . Arrhythmia Father   . Breast cancer Sister   . Hypertension Mother    Past Surgical History:  Procedure Laterality Date  . BREAST EXCISIONAL BIOPSY    . Breast mass removal     Social History   Social History Narrative  . Not on file    Objective: Vital Signs: There were no vitals taken for this visit.   Physical Exam   Musculoskeletal Exam: ***  CDAI Exam: CDAI Score: Not documented Patient Global Assessment: Not documented; Provider Global Assessment: Not documented Swollen: Not documented; Tender: Not documented Joint Exam   Not documented   There is currently no information documented on the homunculus. Go to the Rheumatology activity and complete the homunculus joint exam.  Investigation: No additional findings.  Imaging: No results found.  Recent Labs: Lab Results  Component Value Date   WBC 6.8 08/06/2018   HGB 13.9 08/06/2018   PLT 248 08/06/2018   NA 143 08/06/2018   K 5.1 08/06/2018   CL 106 08/06/2018   CO2 30 08/06/2018   GLUCOSE 82 08/06/2018   BUN 17 08/06/2018   CREATININE 0.80 08/06/2018   BILITOT 0.6 08/06/2018   ALKPHOS 79 06/26/2017   AST 17 08/06/2018   ALT 9 08/06/2018   PROT 6.8 08/06/2018   ALBUMIN 4.1 06/26/2017   CALCIUM 9.7 08/06/2018   GFRAA 95 08/06/2018   QFTBGOLD NEGATIVE 08/14/2017    Speciality Comments: No specialty comments available.  Procedures:  No procedures performed Allergies: Crab [shellfish allergy]   Assessment / Plan:     Visit Diagnoses: Rheumatoid arthritis involving multiple sites with positive rheumatoid factor (HCC) - +CCP  High risk medication use - d/c MTX on 09/15/18 due to SE/she does not want to restart at this time  Primary osteoarthritis of both  hands  Primary osteoarthritis of both knees  Primary osteoarthritis of both feet  DDD (degenerative disc disease), cervical  Other fatigue  Primary insomnia  Osteopenia of multiple sites  History of hyperlipidemia  History of gastroesophageal reflux (GERD)  Dry eyes  Plantar fasciitis, bilateral   Orders: No orders of the defined types were placed in this encounter.  No orders of the defined types were placed in this encounter.   Face-to-face time spent with patient was *** minutes. Greater than 50% of time was spent in counseling and coordination of care.  Follow-Up Instructions: No follow-ups on file.   Ofilia Neas, PA-C  Note - This record has been created using Dragon software.  Chart creation errors have been sought, but may not always  have been located. Such creation errors do not reflect on  the standard of medical care.

## 2018-11-05 ENCOUNTER — Ambulatory Visit (INDEPENDENT_AMBULATORY_CARE_PROVIDER_SITE_OTHER): Payer: 59

## 2018-11-05 ENCOUNTER — Ambulatory Visit: Payer: 59 | Admitting: Sports Medicine

## 2018-11-05 ENCOUNTER — Ambulatory Visit: Payer: 59 | Admitting: Rheumatology

## 2018-11-05 ENCOUNTER — Encounter: Payer: Self-pay | Admitting: Rheumatology

## 2018-11-05 VITALS — BP 119/81 | HR 64 | Resp 12 | Ht 62.0 in | Wt 125.6 lb

## 2018-11-05 DIAGNOSIS — M5030 Other cervical disc degeneration, unspecified cervical region: Secondary | ICD-10-CM

## 2018-11-05 DIAGNOSIS — M25572 Pain in left ankle and joints of left foot: Secondary | ICD-10-CM | POA: Diagnosis not present

## 2018-11-05 DIAGNOSIS — M19041 Primary osteoarthritis, right hand: Secondary | ICD-10-CM | POA: Diagnosis not present

## 2018-11-05 DIAGNOSIS — M170 Bilateral primary osteoarthritis of knee: Secondary | ICD-10-CM

## 2018-11-05 DIAGNOSIS — H04123 Dry eye syndrome of bilateral lacrimal glands: Secondary | ICD-10-CM

## 2018-11-05 DIAGNOSIS — E785 Hyperlipidemia, unspecified: Secondary | ICD-10-CM

## 2018-11-05 DIAGNOSIS — Z79899 Other long term (current) drug therapy: Secondary | ICD-10-CM

## 2018-11-05 DIAGNOSIS — M722 Plantar fascial fibromatosis: Secondary | ICD-10-CM

## 2018-11-05 DIAGNOSIS — M19071 Primary osteoarthritis, right ankle and foot: Secondary | ICD-10-CM | POA: Diagnosis not present

## 2018-11-05 DIAGNOSIS — F5101 Primary insomnia: Secondary | ICD-10-CM

## 2018-11-05 DIAGNOSIS — M0579 Rheumatoid arthritis with rheumatoid factor of multiple sites without organ or systems involvement: Secondary | ICD-10-CM | POA: Diagnosis not present

## 2018-11-05 DIAGNOSIS — R5383 Other fatigue: Secondary | ICD-10-CM

## 2018-11-05 DIAGNOSIS — R1013 Epigastric pain: Secondary | ICD-10-CM

## 2018-11-05 DIAGNOSIS — M8589 Other specified disorders of bone density and structure, multiple sites: Secondary | ICD-10-CM

## 2018-11-05 DIAGNOSIS — M19042 Primary osteoarthritis, left hand: Secondary | ICD-10-CM

## 2018-11-05 DIAGNOSIS — Z8639 Personal history of other endocrine, nutritional and metabolic disease: Secondary | ICD-10-CM

## 2018-11-05 DIAGNOSIS — Z8719 Personal history of other diseases of the digestive system: Secondary | ICD-10-CM

## 2018-11-05 DIAGNOSIS — M19072 Primary osteoarthritis, left ankle and foot: Secondary | ICD-10-CM

## 2018-11-05 MED ORDER — PREDNISONE 5 MG PO TABS: ORAL_TABLET | ORAL | 0 refills | Status: DC

## 2018-11-05 MED ORDER — METHOTREXATE SODIUM CHEMO INJECTION 50 MG/2ML: 10.0000 mg | INTRAMUSCULAR | 0 refills | Status: DC

## 2018-11-05 MED FILL — predniSONE 5 MG TABS: 5 | 16 days supply | Qty: 40 | Fill #0

## 2018-11-05 MED FILL — METHOTREXATE 25 MG/ML VIAL: 50 | 70 days supply | Qty: 4 | Fill #0

## 2018-11-05 NOTE — Patient Instructions (Signed)
Standing Labs We placed an order today for your standing lab work.    Please come back and get your standing labs in 3 months  We have open lab Monday through Friday from 8:30-11:30 AM and 1:30-4:00 PM  at the office of Dr. Bo Merino.   You may experience shorter wait times on Monday and Friday afternoons. The office is located at [address], [place], [place], [place] [address] No appointment is necessary.   Labs are drawn by [facility].  You may receive a bill from [facility] for your lab work.  If you wish to have your labs drawn at another location, please call the office 24 hours in advance to send orders.  If you have any questions regarding directions or hours of operation,  please call [phone].   Just as a reminder please drink plenty of water prior to coming for your lab work. Thanks!

## 2018-11-06 ENCOUNTER — Encounter: Payer: Self-pay | Admitting: Rheumatology

## 2018-11-06 ENCOUNTER — Ambulatory Visit: Payer: 59 | Admitting: Rheumatology

## 2018-11-19 ENCOUNTER — Ambulatory Visit (INDEPENDENT_AMBULATORY_CARE_PROVIDER_SITE_OTHER): Payer: 59 | Admitting: Sports Medicine

## 2018-11-19 ENCOUNTER — Encounter: Payer: Self-pay | Admitting: Sports Medicine

## 2018-11-19 DIAGNOSIS — M7742 Metatarsalgia, left foot: Secondary | ICD-10-CM | POA: Diagnosis not present

## 2018-11-19 NOTE — Assessment & Plan Note (Addendum)
Historically rheumatoid arthritis was in remission, she recently discontinued methotrexate, now having a flare and synovitis in the left third MTP. Very low-dose MTP injection. Noted copious synovitis on ultrasound, I was able to visualize the marginal erosion on the distal metatarsal.  Metatarsal pad placed in the left orthotic. Return for a new set of custom orthotics. I think she is having a rheumatoid arthritis relapse. Further follow-up with her rheumatologist for this.

## 2018-11-19 NOTE — Progress Notes (Signed)
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  We diagnosed her with rheumatoid arthritis several years ago, she has been seeing Dr. Estanislado Pandy with [facility].  She was on methotrexate, had a remission.  Has been off of her methotrexate now.  Now having recurrence of pain and swelling in her left third toe at the MTP.  Severe, worsening, localized without radiation.  I reviewed the past medical history, family history, social history, surgical history, and allergies today and no changes were needed.  Please see the problem list section below in epic for further details.  Past Medical History: Past Medical History:  Diagnosis Date  . Allergic rhinitis   . Esophageal reflux   . Incomplete right bundle branch block   . Rheumatoid arthritis ([place])   . Situational anxiety    Past Surgical History: Past Surgical History:  Procedure Laterality Date  . BREAST EXCISIONAL BIOPSY    . Breast mass removal     Social History: Social History   Socioeconomic History  . Marital status: Married    Spouse name: Not on file  . Number of children: 3  . Years of education: Not on file  . Highest education level: Not on file  Occupational History  . Occupation: Programmer, multimedia: [employer]  . Financial resource strain: Not on file  . Food insecurity:    Worry: Not on file    Inability: Not on file  . Transportation needs:    Medical: Not on file    Non-medical: Not on file  Tobacco Use  . Smoking status: Never Smoker  . Smokeless tobacco: Never Used  Substance and Sexual Activity  . Alcohol use: No  . Drug use: No  . Sexual activity: Not on file  Lifestyle  . Physical activity:    Days per week: Not on file    Minutes per session: Not on file  . Stress: Not on file  Relationships  . Social connections:    Talks on phone: Not on file    Gets together: Not on file    Attends religious service: Not on file   Active member of club or organization: Not on file    Attends meetings of clubs or organizations: Not on file    Relationship status: Not on file  Other Topics Concern  . Not on file  Social History Narrative  . Not on file   Family History: Family History  Problem Relation Age of Onset  . Prostate cancer Father   . Arrhythmia Father   . Breast cancer Sister   . Hypertension Mother    Allergies: Allergies  Allergen Reactions  . Crab [Shellfish Allergy]    Medications: See med rec.  Review of Systems: No fevers, chills, night sweats, weight loss, chest pain, or shortness of breath.   Objective:    General: Well Developed, well nourished, and in no acute distress.  Neuro: Alert and oriented x3, extra-ocular muscles intact, sensation grossly intact.  HEENT: Normocephalic, atraumatic, pupils equal round reactive to light, neck supple, no masses, no lymphadenopathy, thyroid nonpalpable.  Skin: Warm and dry, no rashes. Cardiac: Regular rate and rhythm, no murmurs rubs or gallops, no lower extremity edema.  Respiratory: Clear to auscultation bilaterally. Not using accessory muscles, speaking in full sentences. Left foot: No visible erythema or swelling. Range of motion is full in all directions. Strength is 5/5 in all directions.  No hallux valgus. No pes cavus or pes planus. No abnormal callus noted. No pain over the navicular prominence, or base of fifth metatarsal. No tenderness to palpation of the calcaneal insertion of plantar fascia. No pain at the Achilles insertion. No pain over the calcaneal bursa. No pain of the retrocalcaneal bursa. Swollen, tender third MTP with palpable synovitis. There is also tenderness over the plantar aspect of the third MTP. No hallux rigidus or limitus. No tenderness palpation over interphalangeal joints. No pain with compression of the metatarsal heads. Neurovascularly intact distally.  X-rays personally reviewed, she does have marginal  erosions at the distal metatarsal of the third MTP.  Procedure: Real-time Ultrasound Guided Injection of left third MTP Device: GE Logiq E  Verbal informed consent obtained.  Time-out conducted.  Noted no overlying erythema, induration, or other signs of local infection.  Skin prepped in a sterile fashion.  Local anesthesia: Topical Ethyl chloride.  With sterile technique and under real time ultrasound guidance: Noted copious synovitis on ultrasound, I was able to visualize the marginal erosion on the distal metatarsal.  I guided a 25-gauge needle into the joint and injected 1/4 cc Kenalog 40, 1/4 cc lidocaine. Completed without difficulty  Pain immediately resolved suggesting accurate placement of the medication.  Advised to call if fevers/chills, erythema, induration, drainage, or persistent bleeding.  Images permanently stored and available for review in the ultrasound unit.  Impression: Technically successful ultrasound guided injection.  Metatarsal pad placed in her left orthotic.  Impression and Recommendations:    Metatarsalgia with a left third MTP synovitis Historically rheumatoid arthritis was in remission, she recently discontinued methotrexate, now having a flare and synovitis in the left third MTP. Very low-dose MTP injection. Noted copious synovitis on ultrasound, I was able to visualize the marginal erosion on the distal metatarsal.  Metatarsal pad placed in the left orthotic. Return for a new set of custom orthotics. I think she is having a rheumatoid arthritis relapse. Further follow-up with her rheumatologist for this. ___________________________________________ Gwen Her. Dianah Field, M.D., ABFM., CAQSM. Primary Care and Sports Medicine [facility]  Adjunct Professor of [employer] Medicine

## 2018-11-20 DIAGNOSIS — R748 Abnormal levels of other serum enzymes: Secondary | ICD-10-CM | POA: Diagnosis not present

## 2018-11-28 ENCOUNTER — Ambulatory Visit (INDEPENDENT_AMBULATORY_CARE_PROVIDER_SITE_OTHER): Payer: 59 | Admitting: Sports Medicine

## 2018-11-28 DIAGNOSIS — M7742 Metatarsalgia, left foot: Secondary | ICD-10-CM | POA: Diagnosis not present

## 2018-11-28 NOTE — Progress Notes (Signed)
    Patient was fitted for a : standard, cushioned, semi-rigid orthotic. The orthotic was heated and afterward the patient stood on the orthotic blank positioned on the orthotic stand. The patient was positioned in subtalar neutral position and 10 degrees of ankle dorsiflexion in a weight bearing stance. After completion of molding, a stable base was applied to the orthotic blank. The blank was ground to a stable position for weight bearing. Size: 6 Base: [employer] and safety inspector and Padding: None The patient ambulated these, and they were very comfortable.  I spent 40 minutes with this patient, greater than 50% was face-to-face time counseling regarding the below diagnosis, specifically the prognosis and treatment options for synovitis related to rheumatoid arthritis.  ___________________________________________ Gwen Her. Dianah Field, M.D., ABFM., CAQSM. Primary Care and [place] Instructor of [place] [employer] of Medicine

## 2018-11-28 NOTE — Assessment & Plan Note (Signed)
Historically rheumatoid arthritis has been in remission. Methotrexate was discontinued as she went on an antibiotic. Unfortunately developed synovitis in her left third MTP. I did a left third MTP injection approximately a week and a half ago and she returns today completely symptom-free. New set of custom orthotics today. Return as needed.

## 2018-11-29 DIAGNOSIS — M549 Dorsalgia, unspecified: Secondary | ICD-10-CM | POA: Diagnosis not present

## 2018-11-29 DIAGNOSIS — K5909 Other constipation: Secondary | ICD-10-CM | POA: Diagnosis not present

## 2018-12-14 MED FILL — METHOTREXATE 25 MG/ML VIAL: 50 | 35 days supply | Qty: 2 | Fill #1

## 2018-12-21 ENCOUNTER — Encounter: Payer: Self-pay | Admitting: Rheumatology

## 2018-12-21 DIAGNOSIS — M549 Dorsalgia, unspecified: Secondary | ICD-10-CM | POA: Diagnosis not present

## 2018-12-21 DIAGNOSIS — M5412 Radiculopathy, cervical region: Secondary | ICD-10-CM | POA: Diagnosis not present

## 2018-12-21 DIAGNOSIS — R748 Abnormal levels of other serum enzymes: Secondary | ICD-10-CM | POA: Diagnosis not present

## 2018-12-21 MED FILL — predniSONE 10 MG TABS: 10 | 12 days supply | Qty: 24 | Fill #0

## 2019-01-15 ENCOUNTER — Other Ambulatory Visit: Payer: Self-pay | Admitting: Physician Assistant

## 2019-01-15 DIAGNOSIS — M0579 Rheumatoid arthritis with rheumatoid factor of multiple sites without organ or systems involvement: Secondary | ICD-10-CM

## 2019-01-15 MED FILL — METHOTREXATE 25 MG/ML VIAL: 50 | 35 days supply | Qty: 2 | Fill #0

## 2019-01-15 NOTE — Telephone Encounter (Addendum)
Last Visit: 11/05/18 Next Visit: 02/11/19 Labs: 08/06/18 cbc/cmp   Patient advised she is due for labs. Patient states she recently had lab work by her PCP. Patient will fax results.   Okay to refill 30 day supply per Dr. Estanislado Pandy

## 2019-01-21 ENCOUNTER — Ambulatory Visit: Payer: 59 | Admitting: Rheumatology

## 2019-02-04 NOTE — Progress Notes (Signed)
Virtual Visit via Video Note  I connected with Gina Mitchell on 02/04/19 at  3:00 PM EDT by a video enabled telemedicine application and verified that I am speaking with the correct person using two identifiers.   I discussed the limitations of evaluation and management by telemedicine and the availability of in person appointments. The patient expressed understanding and agreed to proceed.  CC: Pain in multiple joints   History of Present Illness: Patient is a 58 year old female with a past medical history of seropositive rheumatoid arthritis, osteoarthritis, and DDD.  She is on MTX 0.4 ml sq once weekly and folic acid 1 mg po daily. She reports she has been having increased pain in both hands and both feet in the morning.  She has intermittent swelling in her right hand. She has intermittent bilateral knee joint pain.  She rates her RA a 4/10.  She has chronic dry eyes.    Review of Systems  Constitutional: Negative for fever and malaise/fatigue.  Eyes: Negative for photophobia, pain, discharge and redness.       +eye dryness  Respiratory: Negative for cough, shortness of breath and wheezing.   Cardiovascular: Negative for chest pain and palpitations.  Gastrointestinal: Positive for constipation. Negative for blood in stool and diarrhea.  Genitourinary: Negative for dysuria.  Musculoskeletal: Positive for joint pain. Negative for back pain, myalgias and neck pain.       +morning stiffness +joint swelling  Skin: Negative for rash.  Neurological: Negative for dizziness and headaches.  Psychiatric/Behavioral: Negative for depression. The patient has insomnia. The patient is not nervous/anxious.    Observations/Objective:  Physical Exam  Constitutional: She is oriented to person, place, and time and well-developed, well-nourished, and in no distress.  HENT:  Head: Normocephalic and atraumatic.  Eyes: Conjunctivae are normal.  Pulmonary/Chest: Effort normal.  Neurological: She is  alert and oriented to person, place, and time.  Psychiatric: Mood, memory, affect and judgment normal.  She has tenderness of all PIP joints in both hands.  She has intermittent swelling in PIP joints.    Patient reports morning stiffness for 20  minutes.   Patient denies nocturnal pain.  Difficulty dressing/grooming: Denies Difficulty climbing stairs: Denies Difficulty getting out of chair: Denies Difficulty using hands for taps, buttons, cutlery, and/or writing: Denies    Assessment and Plan: Visit Diagnoses: Rheumatoid arthritis involving multiple sites with positive rheumatoid factor ([place]) - +CCP: She has been having increased pain and stiffness in the morning in both hands.  She has tenderness and intermittent swelling of PIP joints.  She has occasional pain in both ankles and both knee joints.  She has intermittent swelling in the right hand.  She is on MTX 0.4 ml sq once weekly and folic acid 1 mg po daily. We discussed increasing her dose of MTX to 0.6 ml sq once weekly.  She will return for lab work in 44 month.  She was advised to notify us if she develops increased joint pain or joint swelling.  She will follow up in 3 months.   High risk medication use - She is currently on MTX 0.4 ml sq once weekly and folic acid 1 mg po daily.  She will increase to MTX 0.6 mlg sq once weekly. She previously d/c MTX on 09/15/18 due to worrying about SEs but restarted due to developing a flare in the left foot.  CBC and CMP were drawn on 12/21/18.  She will return for lab work in May and  every 3 months. She was advised to hold the dose of MTX if she develops an infection.  Hand washing precautions were discussed.    Primary osteoarthritis of both hands: She has been having increased pain and intermittent swelling of PIP joints.  She has complete fist formation bilaterally.  Joint protection and muscle strengthening were discussed.   Primary osteoarthritis of both knees: She has intermittent knee  joint pain and swelling.  She has no discomfort at this time.   Primary osteoarthritis of both feet:  She has intermittent pain in both feet.  She has no left ankle joint pain or swelling at this time.    DDD (degenerative disc disease), cervical: She has no discomfort at this time.  Other fatigue: Related to insomnia.  She was encouraged to stay active and exercise on a regular basis.   Primary insomnia:She has difficulty falling asleep at night.  We discussed trying melatonin to help her sleep at night.   Osteopenia of multiple sites: She is taking a vitamin D supplement.   Dry eyes: She has chronic eye dryness.   Plantar fasciitis, bilateral: Resolved     Follow Up Instructions: She will follow up in the office in 3 months.  Standing orders for CBC and CMP were placed today.  She will return for lab work in May and every 3 months.    I discussed the assessment and treatment plan with the patient. The patient was provided an opportunity to ask questions and all were answered. The patient agreed with the plan and demonstrated an understanding of the instructions.   The patient was advised to call back or seek an in-person evaluation if the symptoms worsen or if the condition fails to improve as anticipated.  I provided 22 minutes of non-face-to-face time during this encounter.  Bo Merino, MD   Scribed by-  Hazel Sams, PA-C

## 2019-02-05 ENCOUNTER — Encounter: Payer: Self-pay | Admitting: Rheumatology

## 2019-02-05 ENCOUNTER — Telehealth (INDEPENDENT_AMBULATORY_CARE_PROVIDER_SITE_OTHER): Payer: 59 | Admitting: Rheumatology

## 2019-02-05 ENCOUNTER — Telehealth: Payer: Self-pay | Admitting: Rheumatology

## 2019-02-05 ENCOUNTER — Other Ambulatory Visit: Payer: Self-pay

## 2019-02-05 DIAGNOSIS — R1013 Epigastric pain: Secondary | ICD-10-CM

## 2019-02-05 DIAGNOSIS — E785 Hyperlipidemia, unspecified: Secondary | ICD-10-CM

## 2019-02-05 DIAGNOSIS — Z8639 Personal history of other endocrine, nutritional and metabolic disease: Secondary | ICD-10-CM

## 2019-02-05 DIAGNOSIS — M170 Bilateral primary osteoarthritis of knee: Secondary | ICD-10-CM

## 2019-02-05 DIAGNOSIS — H04123 Dry eye syndrome of bilateral lacrimal glands: Secondary | ICD-10-CM

## 2019-02-05 DIAGNOSIS — M722 Plantar fascial fibromatosis: Secondary | ICD-10-CM

## 2019-02-05 DIAGNOSIS — R5383 Other fatigue: Secondary | ICD-10-CM

## 2019-02-05 DIAGNOSIS — M8589 Other specified disorders of bone density and structure, multiple sites: Secondary | ICD-10-CM

## 2019-02-05 DIAGNOSIS — Z8719 Personal history of other diseases of the digestive system: Secondary | ICD-10-CM

## 2019-02-05 DIAGNOSIS — F5101 Primary insomnia: Secondary | ICD-10-CM

## 2019-02-05 DIAGNOSIS — M0579 Rheumatoid arthritis with rheumatoid factor of multiple sites without organ or systems involvement: Secondary | ICD-10-CM | POA: Diagnosis not present

## 2019-02-05 DIAGNOSIS — M19041 Primary osteoarthritis, right hand: Secondary | ICD-10-CM

## 2019-02-05 DIAGNOSIS — M5030 Other cervical disc degeneration, unspecified cervical region: Secondary | ICD-10-CM

## 2019-02-05 DIAGNOSIS — M19072 Primary osteoarthritis, left ankle and foot: Secondary | ICD-10-CM

## 2019-02-05 DIAGNOSIS — M19042 Primary osteoarthritis, left hand: Secondary | ICD-10-CM

## 2019-02-05 DIAGNOSIS — Z79899 Other long term (current) drug therapy: Secondary | ICD-10-CM

## 2019-02-05 DIAGNOSIS — M19071 Primary osteoarthritis, right ankle and foot: Secondary | ICD-10-CM

## 2019-02-05 MED ORDER — METHOTREXATE SODIUM CHEMO INJECTION 50 MG/2ML: 15.0000 mg | INTRAMUSCULAR | 0 refills | Status: DC

## 2019-02-05 MED FILL — METHOTREXATE 25 MG/ML VIAL: 50 | 90 days supply | Qty: 8 | Fill #0

## 2019-02-05 NOTE — Telephone Encounter (Signed)
Last Visit: 02/05/19 Next Visit: 07/01/19 Labs: 12/21/18 Baso % 1.3, CO2- 33  Okay to refill per Dr. Estanislado Pandy

## 2019-02-05 NOTE — Telephone Encounter (Signed)
Patient called stating she had a virtual webex appointment with Dr. Estanislado Pandy and was told that she would be increasing her Methotrexate injection to 0.6.  Patient is requesting a prescription with the new dosage be sent to [facility].

## 2019-02-11 ENCOUNTER — Ambulatory Visit: Payer: 59 | Admitting: Rheumatology

## 2019-02-25 ENCOUNTER — Other Ambulatory Visit: Payer: Self-pay | Admitting: Rheumatology

## 2019-02-25 MED FILL — FOLIC ACID 1 MG TABS: 1 | 90 days supply | Qty: 180 | Fill #0

## 2019-02-25 NOTE — Telephone Encounter (Signed)
Last Visit: 02/05/2019 telemedicine Next Visit: 07/01/2019  Okay to refill per Dr. Estanislado Pandy.

## 2019-03-04 ENCOUNTER — Telehealth: Payer: Self-pay | Admitting: Rheumatology

## 2019-03-04 ENCOUNTER — Other Ambulatory Visit: Payer: Self-pay

## 2019-03-04 DIAGNOSIS — Z79899 Other long term (current) drug therapy: Secondary | ICD-10-CM

## 2019-03-04 NOTE — Telephone Encounter (Signed)
Lab orders have been released for [facility].  

## 2019-03-04 NOTE — Telephone Encounter (Signed)
Patient going to [facility] on [address] for labs. Please release orders.

## 2019-03-11 DIAGNOSIS — Z79899 Other long term (current) drug therapy: Secondary | ICD-10-CM | POA: Diagnosis not present

## 2019-03-12 LAB — CBC WITH DIFFERENTIAL/PLATELET
Absolute Monocytes: 389 cells/uL (ref 200–950)
Basophils Absolute: 59 cells/uL (ref 0–200)
Basophils Relative: 0.9 %
Eosinophils Absolute: 251 cells/uL (ref 15–500)
Eosinophils Relative: 3.8 %
HCT: 40.9 % (ref 35.0–45.0)
Hemoglobin: 14.1 g/dL (ref 11.7–15.5)
Lymphs Abs: 2515 cells/uL (ref 850–3900)
MCH: 31.8 pg (ref 27.0–33.0)
MCHC: 34.5 g/dL (ref 32.0–36.0)
MCV: 92.3 fL (ref 80.0–100.0)
MPV: 10.2 fL (ref 7.5–12.5)
Monocytes Relative: 5.9 %
Neutro Abs: 3386 cells/uL (ref 1500–7800)
Neutrophils Relative %: 51.3 %
Platelets: 256 10*3/uL (ref 140–400)
RBC: 4.43 10*6/uL (ref 3.80–5.10)
RDW: 13.6 % (ref 11.0–15.0)
Total Lymphocyte: 38.1 %
WBC: 6.6 10*3/uL (ref 3.8–10.8)

## 2019-03-12 LAB — COMPLETE METABOLIC PANEL WITH GFR
AG Ratio: 1.6 (calc) (ref 1.0–2.5)
ALT: 11 U/L (ref 6–29)
AST: 21 U/L (ref 10–35)
Albumin: 4.2 g/dL (ref 3.6–5.1)
Alkaline phosphatase (APISO): 67 U/L (ref 37–153)
BUN: 16 mg/dL (ref 7–25)
CO2: 28 mmol/L (ref 20–32)
Calcium: 9.8 mg/dL (ref 8.6–10.4)
Chloride: 105 mmol/L (ref 98–110)
Creat: 0.91 mg/dL (ref 0.50–1.05)
GFR, Est African American: 81 mL/min/{1.73_m2} (ref >=60)
GFR, Est Non African American: 70 mL/min/{1.73_m2} (ref >=60)
Globulin: 2.7 g/dL (calc) (ref 1.9–3.7)
Glucose, Bld: 93 mg/dL (ref 65–99)
Potassium: 4.1 mmol/L (ref 3.5–5.3)
Sodium: 142 mmol/L (ref 135–146)
Total Bilirubin: 0.5 mg/dL (ref 0.2–1.2)
Total Protein: 6.9 g/dL (ref 6.1–8.1)

## 2019-03-12 NOTE — Progress Notes (Signed)
Labs are WNL.

## 2019-03-18 ENCOUNTER — Ambulatory Visit: Payer: 59 | Admitting: Rheumatology

## 2019-03-26 ENCOUNTER — Encounter: Payer: Self-pay | Admitting: Rheumatology

## 2019-03-27 NOTE — Telephone Encounter (Signed)
Please, sch an appointment.

## 2019-03-28 NOTE — Progress Notes (Deleted)
Office Visit Note  Patient: Gina Mitchell             Date of Birth: 17-Sep-1961           MRN: [id number]             PCP: Shirline Frees, MD Referring: Shirline Frees, MD Visit Date: 03/29/2019 Occupation: @GUAROCC @  Subjective:  No chief complaint on file.   History of Present Illness: Gina Mitchell is a 58 y.o. female ***   Activities of Daily Living:  Patient reports morning stiffness for *** {minute/hour:19697}.   Patient {ACTIONS;DENIES/REPORTS:21021675::"Denies"} nocturnal pain.  Difficulty dressing/grooming: {ACTIONS;DENIES/REPORTS:21021675::"Denies"} Difficulty climbing stairs: {ACTIONS;DENIES/REPORTS:21021675::"Denies"} Difficulty getting out of chair: {ACTIONS;DENIES/REPORTS:21021675::"Denies"} Difficulty using hands for taps, buttons, cutlery, and/or writing: {ACTIONS;DENIES/REPORTS:21021675::"Denies"}  No Rheumatology ROS completed.   PMFS History:  Patient Active Problem List   Diagnosis Date Noted  . Primary osteoarthritis of both hands 09/08/2017  . Primary osteoarthritis of both feet 09/08/2017  . Primary osteoarthritis of both knees 09/08/2017  . History of gastroesophageal reflux (GERD) 09/08/2017  . Dyslipidemia 09/08/2017  . Osteopenia of multiple sites 09/08/2017  . Right ankle swelling 06/26/2017  . Rheumatoid arthritis ([place]) 06/26/2017  . DDD (degenerative disc disease), cervical 09/13/2016  . Localized primary carpometacarpal osteoarthritis, right 08/26/2016  . Rotator cuff dysfunction, right 08/26/2016  . Metatarsalgia with a left third MTP synovitis 08/01/2016  . Metatarsal stress fracture of left foot 04/19/2016  . Abnormal EKG 11/06/2014  . Chest pain 06/10/2014  . Plantar fasciitis, bilateral 11/25/2013    Past Medical History:  Diagnosis Date  . Allergic rhinitis   . Esophageal reflux   . Incomplete right bundle branch block   . Rheumatoid arthritis ([place])   . Situational anxiety     Family History  Problem Relation Age of  Onset  . Prostate cancer Father   . Arrhythmia Father   . Breast cancer Sister   . Hypertension Mother    Past Surgical History:  Procedure Laterality Date  . BREAST EXCISIONAL BIOPSY    . Breast mass removal     Social History   Social History Narrative  . Not on file    There is no immunization history on file for this patient.   Objective: Vital Signs: There were no vitals taken for this visit.   Physical Exam   Musculoskeletal Exam: ***  CDAI Exam: CDAI Score: Not documented Patient Global Assessment: Not documented; Provider Global Assessment: Not documented Swollen: Not documented; Tender: Not documented Joint Exam   Not documented   There is currently no information documented on the homunculus. Go to the Rheumatology activity and complete the homunculus joint exam.  Investigation: No additional findings.  Imaging: No results found.  Recent Labs: Lab Results  Component Value Date   WBC 6.6 03/11/2019   HGB 14.1 03/11/2019   PLT 256 03/11/2019   NA 142 03/11/2019   K 4.1 03/11/2019   CL 105 03/11/2019   CO2 28 03/11/2019   GLUCOSE 93 03/11/2019   BUN 16 03/11/2019   CREATININE 0.91 03/11/2019   BILITOT 0.5 03/11/2019   ALKPHOS 79 06/26/2017   AST 21 03/11/2019   ALT 11 03/11/2019   PROT 6.9 03/11/2019   ALBUMIN 4.1 06/26/2017   CALCIUM 9.8 03/11/2019   GFRAA 81 03/11/2019   QFTBGOLD NEGATIVE 08/14/2017    Speciality Comments: No specialty comments available.  Procedures:  No procedures performed Allergies: Otho Darner allergy]   Assessment / Plan:     Visit Diagnoses:  No diagnosis found.   Orders: No orders of the defined types were placed in this encounter.  No orders of the defined types were placed in this encounter.   Face-to-face time spent with patient was *** minutes. Greater than 50% of time was spent in counseling and coordination of care.  Follow-Up Instructions: No follow-ups on file.   Ofilia Neas, PA-C   Note - This record has been created using Dragon software.  Chart creation errors have been sought, but may not always  have been located. Such creation errors do not reflect on  the standard of medical care.

## 2019-03-28 NOTE — Telephone Encounter (Signed)
Scheduled patient an appointment for 03/29/19.

## 2019-03-29 ENCOUNTER — Ambulatory Visit: Payer: 59 | Admitting: Physician Assistant

## 2019-04-01 NOTE — Progress Notes (Signed)
Office Visit Note  Patient: Gina Mitchell             Date of Birth: 1961-04-30           MRN: [id number]             PCP: Shirline Frees, MD Referring: Shirline Frees, MD Visit Date: 04/02/2019 Occupation: @GUAROCC @  Subjective:  Fatigue and joint pain.   History of Present Illness: Gina Mitchell is a 58 y.o. female with history of rheumatoid arthritis and osteoarthritis.  She states she has been having increased pain and discomfort in her joints but no swelling.  She states the pain has been better since she has been on increased dose of methotrexate.  Her main concern today was fatigue.  She states she feels tired all the time.  She is concerned about folic acid and other vitamin deficiency.  Activities of Daily Living:  Patient reports morning stiffness for 2 minutes.   Patient Denies nocturnal pain.  Difficulty dressing/grooming: Denies Difficulty climbing stairs: Denies Difficulty getting out of chair: Denies Difficulty using hands for taps, buttons, cutlery, and/or writing: Denies  Review of Systems  Constitutional: Positive for fatigue. Negative for night sweats, weight gain and weight loss.  HENT: Negative for mouth sores, trouble swallowing, trouble swallowing, mouth dryness and nose dryness.   Eyes: Positive for dryness. Negative for pain, redness and visual disturbance.  Respiratory: Negative for cough, shortness of breath and difficulty breathing.   Cardiovascular: Negative for chest pain, palpitations, hypertension, irregular heartbeat and swelling in legs/feet.  Gastrointestinal: Negative for blood in stool, constipation and diarrhea.  Endocrine: Negative for increased urination.  Genitourinary: Negative for vaginal dryness.  Musculoskeletal: Positive for arthralgias, joint pain and morning stiffness. Negative for joint swelling, myalgias, muscle weakness, muscle tenderness and myalgias.  Skin: Negative for color change, rash, hair loss, skin tightness, ulcers  and sensitivity to sunlight.  Allergic/Immunologic: Negative for susceptible to infections.  Neurological: Negative for dizziness, memory loss, night sweats and weakness.  Hematological: Negative for swollen glands.  Psychiatric/Behavioral: Positive for sleep disturbance. Negative for depressed mood. The patient is not nervous/anxious.     PMFS History:  Patient Active Problem List   Diagnosis Date Noted   Primary osteoarthritis of both hands 09/08/2017   Primary osteoarthritis of both feet 09/08/2017   Primary osteoarthritis of both knees 09/08/2017   History of gastroesophageal reflux (GERD) 09/08/2017   Dyslipidemia 09/08/2017   Osteopenia of multiple sites 09/08/2017   Right ankle swelling 06/26/2017   Rheumatoid arthritis ([place]) 06/26/2017   DDD (degenerative disc disease), cervical 09/13/2016   Localized primary carpometacarpal osteoarthritis, right 08/26/2016   Rotator cuff dysfunction, right 08/26/2016   Metatarsalgia with a left third MTP synovitis 08/01/2016   Metatarsal stress fracture of left foot 04/19/2016   Abnormal EKG 11/06/2014   Chest pain 06/10/2014   Plantar fasciitis, bilateral 11/25/2013    Past Medical History:  Diagnosis Date   Allergic rhinitis    Esophageal reflux    Incomplete right bundle branch block    Rheumatoid arthritis ([place])    Situational anxiety     Family History  Problem Relation Age of Onset   Prostate cancer Father    Arrhythmia Father    Breast cancer Sister    Hypertension Mother    Past Surgical History:  Procedure Laterality Date   BREAST EXCISIONAL BIOPSY     Breast mass removal     Social History   Social History Narrative   Not  on file    There is no immunization history on file for this patient.   Objective: Vital Signs: BP 124/83 (BP Location: Left Arm, Patient Position: Sitting, Cuff Size: Normal)    Pulse 64    Resp 12    Ht 5\' 2"  (1.575 m)    Wt 126 lb 12.8 oz (57.5 kg)    BMI 23.19  kg/m    Physical Exam Vitals signs and nursing note reviewed.  Constitutional:      Appearance: She is well-developed.  HENT:     Head: Normocephalic and atraumatic.  Eyes:     Conjunctiva/sclera: Conjunctivae normal.  Neck:     Musculoskeletal: Normal range of motion.  Cardiovascular:     Rate and Rhythm: Normal rate and regular rhythm.     Heart sounds: Normal heart sounds.  Pulmonary:     Effort: Pulmonary effort is normal.     Breath sounds: Normal breath sounds.  Abdominal:     General: Bowel sounds are normal.     Palpations: Abdomen is soft.  Lymphadenopathy:     Cervical: No cervical adenopathy.  Skin:    General: Skin is warm and dry.     Capillary Refill: Capillary refill takes less than 2 seconds.  Neurological:     Mental Status: She is alert and oriented to person, place, and time.  Psychiatric:        Behavior: Behavior normal.      Musculoskeletal Exam: C-spine thoracic and lumbar spine were in good range of motion.  Shoulder joints elbow joints wrist joint MCPs PIPs DIPs been good range of motion.  She had DIP thickening consistent with osteoarthritis.  Hip joints knee joints ankles MTPs PIPs been good range of motion with no synovitis.  CDAI Exam: CDAI Score: 0.4  Patient Global Assessment: 2 (mm); Provider Global Assessment: 2 (mm) Swollen: 0 ; Tender: 0  Joint Exam   Not documented   There is currently no information documented on the homunculus. Go to the Rheumatology activity and complete the homunculus joint exam.  Investigation: No additional findings.  Imaging: No results found.  Recent Labs: Lab Results  Component Value Date   WBC 6.6 03/11/2019   HGB 14.1 03/11/2019   PLT 256 03/11/2019   NA 142 03/11/2019   K 4.1 03/11/2019   CL 105 03/11/2019   CO2 28 03/11/2019   GLUCOSE 93 03/11/2019   BUN 16 03/11/2019   CREATININE 0.91 03/11/2019   BILITOT 0.5 03/11/2019   ALKPHOS 79 06/26/2017   AST 21 03/11/2019   ALT 11 03/11/2019     PROT 6.9 03/11/2019   ALBUMIN 4.1 06/26/2017   CALCIUM 9.8 03/11/2019   GFRAA 81 03/11/2019   QFTBGOLD NEGATIVE 08/14/2017    Speciality Comments: No specialty comments available.  Procedures:  No procedures performed Allergies: Crab [shellfish allergy]   Assessment / Plan:     Visit Diagnoses: Rheumatoid arthritis involving multiple sites with positive rheumatoid factor (HCC)-patient had no synovitis on examination today.  She is seems to be doing clinically well on current dose of methotrexate.  High risk medication use - MTX 0.6 ml sq q wk and folic acid 1 mg p.o. daily.  Her labs have been stable.  Primary osteoarthritis of both hands-joint protection muscle strengthening was discussed.  Primary osteoarthritis of both knees-she has minimal discomfort.  Primary osteoarthritis of both feet  DDD (degenerative disc disease), cervical-she is currently not having much discomfort.  Primary insomnia-good sleep hygiene was discussed.  Other fatigue -she has been experiencing increased fatigue and is concerned about vitamin deficiency.  I will obtain following labs.  Plan: CK, TSH, Vitamin B12, VITAMIN D 25 Hydroxy (Vit-D Deficiency, Fractures), Folate  Osteopenia of multiple sites-use of calcium and vitamin D was discussed.  Dyslipidemia  Dry eyes  History of gastroesophageal reflux (GERD)   Orders: Orders Placed This Encounter  Procedures   CK   TSH   Vitamin B12   VITAMIN D 25 Hydroxy (Vit-D Deficiency, Fractures)   Folate   No orders of the defined types were placed in this encounter.    Follow-Up Instructions: Return in about 3 months (around 07/03/2019) for Rheumatoid arthritis.   Bo Merino, MD  Note - This record has been created using Editor, commissioning.  Chart creation errors have been sought, but may not always  have been located. Such creation errors do not reflect on  the standard of medical care.

## 2019-04-02 ENCOUNTER — Encounter: Payer: Self-pay | Admitting: Rheumatology

## 2019-04-02 ENCOUNTER — Other Ambulatory Visit: Payer: Self-pay

## 2019-04-02 ENCOUNTER — Ambulatory Visit: Payer: 59 | Admitting: Rheumatology

## 2019-04-02 VITALS — BP 124/83 | HR 64 | Resp 12 | Ht 62.0 in | Wt 126.8 lb

## 2019-04-02 DIAGNOSIS — Z8719 Personal history of other diseases of the digestive system: Secondary | ICD-10-CM

## 2019-04-02 DIAGNOSIS — M5030 Other cervical disc degeneration, unspecified cervical region: Secondary | ICD-10-CM

## 2019-04-02 DIAGNOSIS — M170 Bilateral primary osteoarthritis of knee: Secondary | ICD-10-CM | POA: Diagnosis not present

## 2019-04-02 DIAGNOSIS — F5101 Primary insomnia: Secondary | ICD-10-CM | POA: Diagnosis not present

## 2019-04-02 DIAGNOSIS — M19072 Primary osteoarthritis, left ankle and foot: Secondary | ICD-10-CM

## 2019-04-02 DIAGNOSIS — E785 Hyperlipidemia, unspecified: Secondary | ICD-10-CM

## 2019-04-02 DIAGNOSIS — R5383 Other fatigue: Secondary | ICD-10-CM

## 2019-04-02 DIAGNOSIS — M19041 Primary osteoarthritis, right hand: Secondary | ICD-10-CM

## 2019-04-02 DIAGNOSIS — M0579 Rheumatoid arthritis with rheumatoid factor of multiple sites without organ or systems involvement: Secondary | ICD-10-CM | POA: Diagnosis not present

## 2019-04-02 DIAGNOSIS — M19042 Primary osteoarthritis, left hand: Secondary | ICD-10-CM

## 2019-04-02 DIAGNOSIS — H04123 Dry eye syndrome of bilateral lacrimal glands: Secondary | ICD-10-CM

## 2019-04-02 DIAGNOSIS — Z79899 Other long term (current) drug therapy: Secondary | ICD-10-CM | POA: Diagnosis not present

## 2019-04-02 DIAGNOSIS — M19071 Primary osteoarthritis, right ankle and foot: Secondary | ICD-10-CM

## 2019-04-02 DIAGNOSIS — M8589 Other specified disorders of bone density and structure, multiple sites: Secondary | ICD-10-CM

## 2019-04-03 LAB — TSH: TSH: 2.24 mIU/L (ref 0.40–4.50)

## 2019-04-03 LAB — FOLATE: Folate: >24 ng/mL

## 2019-04-03 LAB — VITAMIN B12: Vitamin B-12: 466 pg/mL (ref 200–1100)

## 2019-04-03 LAB — CK: Total CK: 74 U/L (ref 29–143)

## 2019-04-03 LAB — VITAMIN D 25 HYDROXY (VIT D DEFICIENCY, FRACTURES): Vit D, 25-Hydroxy: 44 ng/mL (ref 30–100)

## 2019-04-27 ENCOUNTER — Other Ambulatory Visit: Payer: Self-pay | Admitting: Rheumatology

## 2019-04-27 DIAGNOSIS — M0579 Rheumatoid arthritis with rheumatoid factor of multiple sites without organ or systems involvement: Secondary | ICD-10-CM

## 2019-04-29 MED FILL — METHOTREXATE 25 MG/ML VIAL: 50 | 90 days supply | Qty: 8 | Fill #0

## 2019-04-29 NOTE — Telephone Encounter (Signed)
Last Visit: 04/02/19 Next Visit: 07/15/19 Labs: 03/11/19 WNL  Okay to refill per Dr. Estanislado Pandy

## 2019-05-06 DIAGNOSIS — H16203 Unspecified keratoconjunctivitis, bilateral: Secondary | ICD-10-CM | POA: Diagnosis not present

## 2019-05-06 DIAGNOSIS — H16103 Unspecified superficial keratitis, bilateral: Secondary | ICD-10-CM | POA: Diagnosis not present

## 2019-05-06 MED FILL — PREDNISOLONE AC 1% EYE DROP: 1 | 20 days supply | Qty: 5 | Fill #0

## 2019-05-20 ENCOUNTER — Telehealth: Payer: Self-pay | Admitting: Rheumatology

## 2019-05-20 DIAGNOSIS — H0014 Chalazion left upper eyelid: Secondary | ICD-10-CM | POA: Diagnosis not present

## 2019-05-20 DIAGNOSIS — H10413 Chronic giant papillary conjunctivitis, bilateral: Secondary | ICD-10-CM | POA: Diagnosis not present

## 2019-05-20 MED FILL — CEPHALEXIN 500 MG CAPSULE: 500 | 7 days supply | Qty: 15 | Fill #0

## 2019-05-20 NOTE — Telephone Encounter (Signed)
Patient called stating her eye doctor prescribed Keflex for one week for an eye infection.  Patient states she took her last does of Methotrexate on Saturday 05/18/19.  Patient is requesting a return call to let her know if it is okay to begin taking the antibiotic and if she should stop taking the Methotrexate.

## 2019-05-20 NOTE — Telephone Encounter (Signed)
Returned patient call.  Instructed patient to hold dose on 7/25.  She may resume on 8/1 if the infection is cleared and is done with antibiotics.  Patient verbalized understanding.  All questions encouraged and answered.  Instructed patient to call with any further questions or concerns.  Mariella Saa, PharmD, [place], [place] Clinical Specialty Pharmacist [phone]  05/20/2019 3:50 PM

## 2019-05-22 MED FILL — PREDNISOLONE AC 1% EYE DROP: 1 | 14 days supply | Qty: 5 | Fill #0

## 2019-05-29 DIAGNOSIS — E7800 Pure hypercholesterolemia, unspecified: Secondary | ICD-10-CM | POA: Diagnosis not present

## 2019-05-29 DIAGNOSIS — R079 Chest pain, unspecified: Secondary | ICD-10-CM | POA: Diagnosis not present

## 2019-05-29 DIAGNOSIS — M069 Rheumatoid arthritis, unspecified: Secondary | ICD-10-CM | POA: Diagnosis not present

## 2019-05-29 DIAGNOSIS — Z0000 Encounter for general adult medical examination without abnormal findings: Secondary | ICD-10-CM | POA: Diagnosis not present

## 2019-05-29 DIAGNOSIS — R109 Unspecified abdominal pain: Secondary | ICD-10-CM | POA: Diagnosis not present

## 2019-05-29 DIAGNOSIS — K5900 Constipation, unspecified: Secondary | ICD-10-CM | POA: Diagnosis not present

## 2019-05-29 DIAGNOSIS — K219 Gastro-esophageal reflux disease without esophagitis: Secondary | ICD-10-CM | POA: Diagnosis not present

## 2019-05-29 DIAGNOSIS — E559 Vitamin D deficiency, unspecified: Secondary | ICD-10-CM | POA: Diagnosis not present

## 2019-05-29 DIAGNOSIS — F419 Anxiety disorder, unspecified: Secondary | ICD-10-CM | POA: Diagnosis not present

## 2019-06-19 DIAGNOSIS — Z1211 Encounter for screening for malignant neoplasm of colon: Secondary | ICD-10-CM | POA: Diagnosis not present

## 2019-06-19 DIAGNOSIS — Z1239 Encounter for other screening for malignant neoplasm of breast: Secondary | ICD-10-CM | POA: Diagnosis not present

## 2019-06-19 DIAGNOSIS — M069 Rheumatoid arthritis, unspecified: Secondary | ICD-10-CM | POA: Diagnosis not present

## 2019-06-19 DIAGNOSIS — Z6824 Body mass index (BMI) 24.0-24.9, adult: Secondary | ICD-10-CM | POA: Diagnosis not present

## 2019-06-19 DIAGNOSIS — Z01419 Encounter for gynecological examination (general) (routine) without abnormal findings: Secondary | ICD-10-CM | POA: Diagnosis not present

## 2019-06-19 IMAGING — CR DG HAND COMPLETE 3+V*L*
3 series · 3 of 3 positions shown · non-contrast
Comparison: None.

CLINICAL DATA: Joint pain for 1 month, no known injury, initial
encounter

EXAM:
LEFT HAND - COMPLETE 3+ VIEW

[x hand pa left]
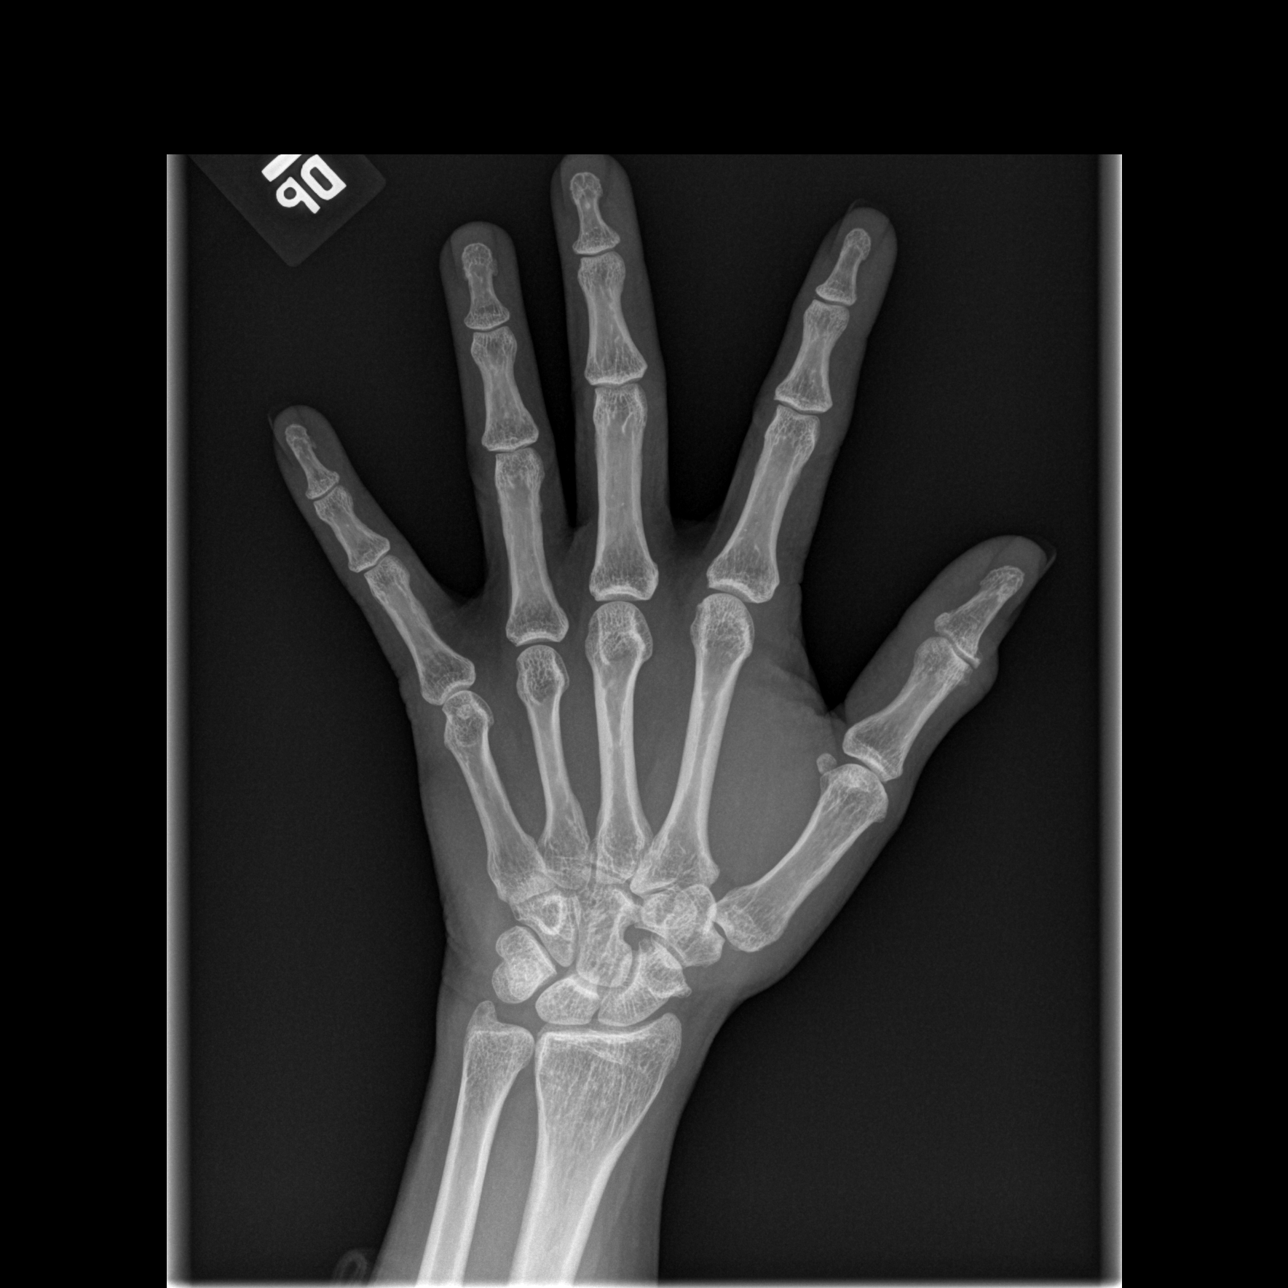

[x hand oblique left]
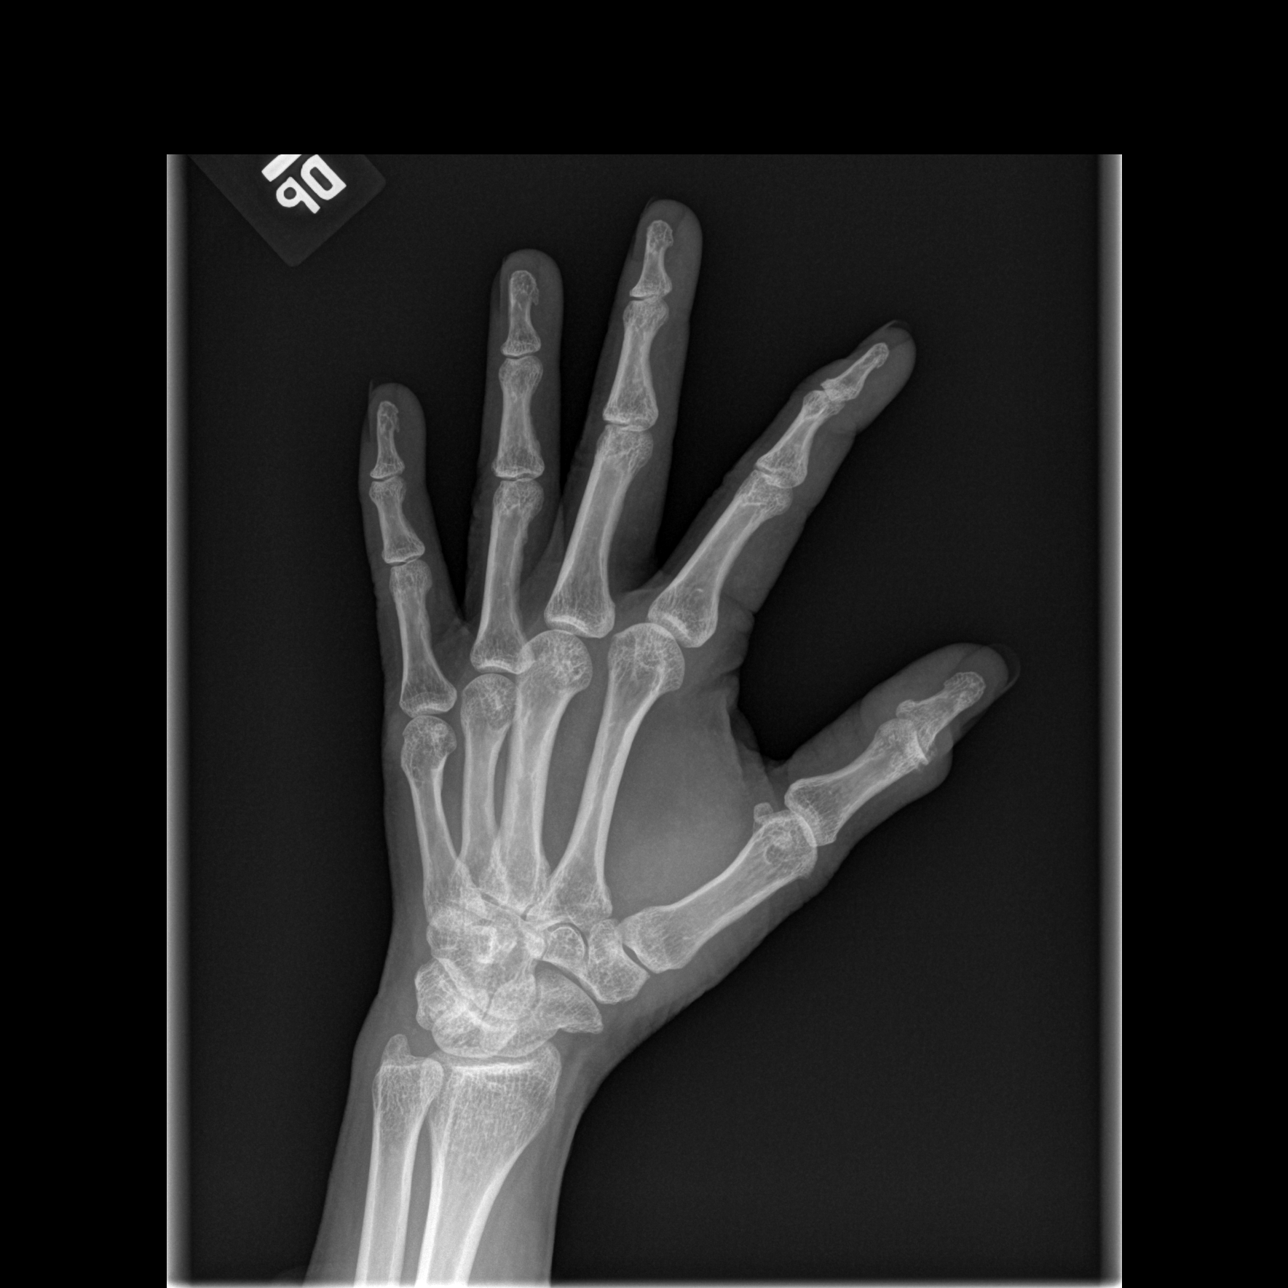

[x hand lat left]
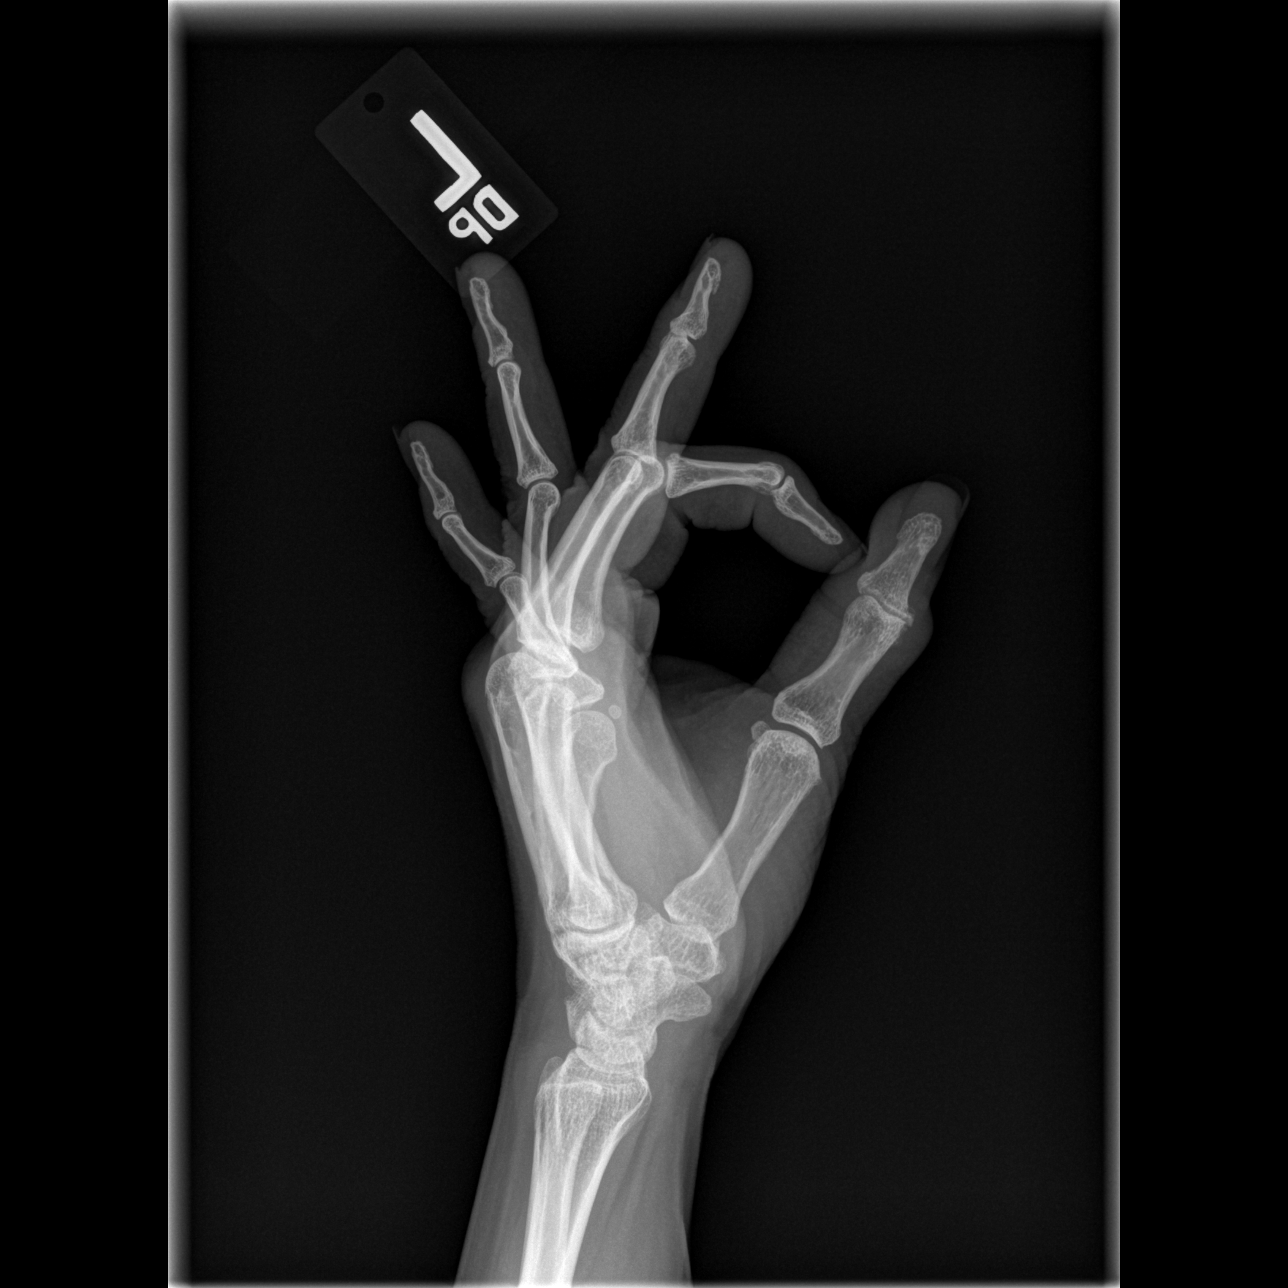

[3 of 3 positions shown; findings below may reference images not displayed]

FINDINGS: No acute fracture or dislocation is noted. Mild degenerative changes
are noted in the first interphalangeal joint. No soft tissue
abnormality is noted.
IMPRESSION: Degenerative changes without acute abnormality.

## 2019-06-26 DIAGNOSIS — Z1231 Encounter for screening mammogram for malignant neoplasm of breast: Secondary | ICD-10-CM | POA: Diagnosis not present

## 2019-07-01 ENCOUNTER — Ambulatory Visit: Payer: Self-pay | Admitting: Rheumatology

## 2019-07-01 NOTE — Progress Notes (Signed)
Office Visit Note  Patient: Gina Mitchell             Date of Birth: February 15, 1961           MRN: [id number]             PCP: Shirline Frees, MD Referring: Shirline Frees, MD Visit Date: 07/15/2019 Occupation: @GUAROCC @  Subjective:  Inflammation in eyes.Marland Kitchen    History of Present Illness: Gina Mitchell is a 58 y.o. female with history of seropositive rheumatoid arthritis.  She states she is not having much joint pain.  She is taking methotrexate 0.6 mL subcu weekly.  She states she has been having problems with the right eye inflammation.  She was seen by Dr. Delman Cheadle who prescribed her Pred forte eyedrops.  She used them for about 3 months  She also has a stye in her left eye for which she is using topical eyedrops.  Patient was advised to stop methotrexate due to infection in her eye.  Activities of Daily Living:  Patient reports morning stiffness for 0 minute.   Patient Denies nocturnal pain.  Difficulty dressing/grooming: Denies Difficulty climbing stairs: Denies Difficulty getting out of chair: Denies Difficulty using hands for taps, buttons, cutlery, and/or writing: Denies  Review of Systems  Constitutional: Negative for fatigue, night sweats, weight gain and weight loss.  HENT: Negative for mouth sores, trouble swallowing, trouble swallowing, mouth dryness and nose dryness.   Eyes: Positive for redness and dryness. Negative for pain and visual disturbance.  Respiratory: Negative for cough, shortness of breath and difficulty breathing.   Cardiovascular: Negative for chest pain, palpitations, hypertension, irregular heartbeat and swelling in legs/feet.  Gastrointestinal: Positive for heartburn. Negative for blood in stool, constipation and diarrhea.  Endocrine: Negative for increased urination.  Genitourinary: Negative for vaginal dryness.  Musculoskeletal: Negative for arthralgias, joint pain, joint swelling, myalgias, muscle weakness, morning stiffness, muscle tenderness and  myalgias.  Skin: Negative for color change, rash, hair loss, skin tightness, ulcers and sensitivity to sunlight.  Allergic/Immunologic: Negative for susceptible to infections.  Neurological: Negative for dizziness, memory loss, night sweats and weakness.  Hematological: Negative for swollen glands.  Psychiatric/Behavioral: Negative for depressed mood and sleep disturbance. The patient is not nervous/anxious.     PMFS History:  Patient Active Problem List   Diagnosis Date Noted  . Primary osteoarthritis of both hands 09/08/2017  . Primary osteoarthritis of both feet 09/08/2017  . Primary osteoarthritis of both knees 09/08/2017  . History of gastroesophageal reflux (GERD) 09/08/2017  . Dyslipidemia 09/08/2017  . Osteopenia of multiple sites 09/08/2017  . Right ankle swelling 06/26/2017  . Rheumatoid arthritis ([place]) 06/26/2017  . DDD (degenerative disc disease), cervical 09/13/2016  . Localized primary carpometacarpal osteoarthritis, right 08/26/2016  . Rotator cuff dysfunction, right 08/26/2016  . Metatarsalgia with a left third MTP synovitis 08/01/2016  . Metatarsal stress fracture of left foot 04/19/2016  . Abnormal EKG 11/06/2014  . Chest pain 06/10/2014  . Plantar fasciitis, bilateral 11/25/2013    Past Medical History:  Diagnosis Date  . Allergic rhinitis   . Esophageal reflux   . Incomplete right bundle branch block   . Rheumatoid arthritis ([place])   . Situational anxiety     Family History  Problem Relation Age of Onset  . Prostate cancer Father   . Arrhythmia Father   . Breast cancer Sister   . Hypertension Mother    Past Surgical History:  Procedure Laterality Date  . BREAST EXCISIONAL BIOPSY    .  Breast mass removal     Social History   Social History Narrative  . Not on file    There is no immunization history on file for this patient.   Objective: Vital Signs: BP 117/79 (BP Location: Left Arm, Patient Position: Sitting, Cuff Size: Normal)   Pulse 60    Resp 13   Ht 5\' 2"  (1.575 m)   Wt 127 lb (57.6 kg)   BMI 23.23 kg/m    Physical Exam Vitals signs and nursing note reviewed.  Constitutional:      Appearance: She is well-developed.  HENT:     Head: Normocephalic and atraumatic.  Eyes:     Conjunctiva/sclera: Conjunctivae normal.  Neck:     Musculoskeletal: Normal range of motion.  Cardiovascular:     Rate and Rhythm: Normal rate and regular rhythm.     Heart sounds: Normal heart sounds.  Pulmonary:     Effort: Pulmonary effort is normal.     Breath sounds: Normal breath sounds.  Abdominal:     General: Bowel sounds are normal.     Palpations: Abdomen is soft.  Lymphadenopathy:     Cervical: No cervical adenopathy.  Skin:    General: Skin is warm and dry.     Capillary Refill: Capillary refill takes less than 2 seconds.  Neurological:     Mental Status: She is alert and oriented to person, place, and time.  Psychiatric:        Behavior: Behavior normal.      Musculoskeletal Exam: C-spine, shoulder joints, elbow joints, wrist joints, MCPs PIPs and DIPs with good range of motion with no synovitis.  Hip joints, knee joints, ankles, MTPs and PIPs with good range of motion with no synovitis.  CDAI Exam: CDAI Score: 0.4  Patient Global: 2 mm; Provider Global: 2 mm Swollen: 0 ; Tender: 0  Joint Exam   No joint exam has been documented for this visit   There is currently no information documented on the homunculus. Go to the Rheumatology activity and complete the homunculus joint exam.  Investigation: No additional findings.  Imaging: No results found.  Recent Labs: Lab Results  Component Value Date   WBC 6.6 03/11/2019   HGB 14.1 03/11/2019   PLT 256 03/11/2019   NA 142 03/11/2019   K 4.1 03/11/2019   CL 105 03/11/2019   CO2 28 03/11/2019   GLUCOSE 93 03/11/2019   BUN 16 03/11/2019   CREATININE 0.91 03/11/2019   BILITOT 0.5 03/11/2019   ALKPHOS 79 06/26/2017   AST 21 03/11/2019   ALT 11 03/11/2019    PROT 6.9 03/11/2019   ALBUMIN 4.1 06/26/2017   CALCIUM 9.8 03/11/2019   GFRAA 81 03/11/2019   QFTBGOLD NEGATIVE 08/14/2017  May 29, 2019 CMP within normal limits and CBC normal  Speciality Comments: No specialty comments available.  Procedures:  No procedures performed Allergies: Crab [shellfish allergy]   Assessment / Plan:     Visit Diagnoses: Rheumatoid arthritis involving multiple sites with positive rheumatoid factor ([place]) -patient has no synovitis on examination.  She is doing well on methotrexate.  She reduced her dose of methotrexate from 0.07 to 0.06.  She believes that higher dose was causing increased fatigue.  She has no synovitis on examination today.  She is always interested in tapering the dose of methotrexate.  I discouraged tapering methotrexate.  I would like to know if patient has any underlying ocular involvement prior to doing any taper.  High risk medication use -  Methotrexate 0.6 ml every 7 days and folic acid 1 mg 2 tablets daily.  Most recent CBC/CMP within normal limits on 03/11/2019.  She is overdue for labs.  CBC/CMP ordered for today and will monitor every 3 months. Standing orders are in place.  Eye inflammation -patient states she has been having recurrent inflammation in her eyes for the last 3 months.  She has been on Pred Forte eyedrops.  She was recently placed on some antibiotic eyedrops for stye in her left eye.  She wants second opinion for eye inflammation.  I will make a referral for her.  Plan: Ambulatory referral to Ophthalmology  Primary osteoarthritis of both hands-she has mild osteoarthritic changes.  Primary osteoarthritis of both knees-currently asymptomatic.  Primary osteoarthritis of both feet  DDD (degenerative disc disease), cervical-she has mild discomfort.  Primary insomnia  Other fatigue  Osteopenia of multiple sites  Dyslipidemia  History of gastroesophageal reflux (GERD)  History of hyperlipidemia  Orders: Orders Placed  This Encounter  Procedures  . Ambulatory referral to Ophthalmology   No orders of the defined types were placed in this encounter.   .  Follow-Up Instructions: Return in about 5 months (around 12/15/2019) for Rheumatoid arthritis.   Bo Merino, MD  Note - This record has been created using Editor, commissioning.  Chart creation errors have been sought, but may not always  have been located. Such creation errors do not reflect on  the standard of medical care.

## 2019-07-03 ENCOUNTER — Other Ambulatory Visit: Payer: Self-pay | Admitting: Rheumatology

## 2019-07-03 DIAGNOSIS — M0579 Rheumatoid arthritis with rheumatoid factor of multiple sites without organ or systems involvement: Secondary | ICD-10-CM

## 2019-07-03 NOTE — Telephone Encounter (Signed)
Last Visit: 04/02/19 Next Visit: 07/15/19 Labs: 03/11/19 WNL   Patient to update labs at appointment on 07/15/19   Okay to refill 30 day supply per Dr. Estanislado Pandy

## 2019-07-05 ENCOUNTER — Other Ambulatory Visit: Payer: Self-pay

## 2019-07-05 DIAGNOSIS — R6889 Other general symptoms and signs: Secondary | ICD-10-CM | POA: Diagnosis not present

## 2019-07-05 DIAGNOSIS — Z20822 Contact with and (suspected) exposure to covid-19: Secondary | ICD-10-CM

## 2019-07-06 LAB — NOVEL CORONAVIRUS, NAA: SARS-CoV-2, NAA: NOT DETECTED

## 2019-07-09 MED FILL — METHOTREXATE 25 MG/ML VIAL: 50 | 42 days supply | Qty: 4 | Fill #0

## 2019-07-10 ENCOUNTER — Telehealth: Payer: Self-pay | Admitting: Rheumatology

## 2019-07-10 DIAGNOSIS — H00014 Hordeolum externum left upper eyelid: Secondary | ICD-10-CM | POA: Diagnosis not present

## 2019-07-10 DIAGNOSIS — H00034 Abscess of left upper eyelid: Secondary | ICD-10-CM | POA: Diagnosis not present

## 2019-07-10 MED FILL — NEO/POLYMYXIN/DEXAMETH DROP: 3.5-10000-0 | 15 days supply | Qty: 5 | Fill #0

## 2019-07-10 NOTE — Telephone Encounter (Signed)
Returned patient call.  She has a stye on her eye.  This is a recurrent issue and she was previously treated with antibiotics but the stye returned.  Eye doctor thinks it could possibly be a calcium deposit and drained stye.  He placed her on Maxitrol eye drops.  She is concerned it could interact with methotrexate.  Her last dose of methotrexate was yesterday.  Informed patient that she can go ahead and take the eye drops.  Instructed patient to hold next dose of methotrexate if her eye symptoms have not improved.  Reviewed importance of holding MTX for infection.  Patient verbalized understanding.  She is experiencing allergy symptoms off and on sych as puffy itchy eyes.  She started taking Claritin which has been effective.  She is concerned that Claritin could worsen dry eye.  Asked patient if she is experiencing dry eyes while taking Claritin.  She denies any dry eye.  Instructed patient to continue Claritin and she may discontinue if she has issues with dry eyes.  Patient asked if she is able to take Thera Tears with the Maxitrol eye drops if needed.  Instructed patient she may take them together but should space out drops by 5 mins.  Patient verbalized understanding.  All questions encouraged and answered.  Instructed patient to call with any questions or concerns.   Mariella Saa, PharmD, [place], [place] Clinical Specialty Pharmacist [phone]  07/10/2019 4:38 PM

## 2019-07-10 NOTE — Telephone Encounter (Signed)
Patient left a voicemail stating she saw her eye doctor today and he prescribed Maxitrol eye drops to treat her stye.  Patient states she took her injection last night and wanted to make sure it was okay to use the steroid eye drop.

## 2019-07-15 ENCOUNTER — Encounter: Payer: Self-pay | Admitting: Rheumatology

## 2019-07-15 ENCOUNTER — Ambulatory Visit (INDEPENDENT_AMBULATORY_CARE_PROVIDER_SITE_OTHER): Payer: 59 | Admitting: Rheumatology

## 2019-07-15 ENCOUNTER — Other Ambulatory Visit: Payer: Self-pay

## 2019-07-15 VITALS — BP 117/79 | HR 60 | Resp 13 | Ht 62.0 in | Wt 127.0 lb

## 2019-07-15 DIAGNOSIS — R5383 Other fatigue: Secondary | ICD-10-CM | POA: Diagnosis not present

## 2019-07-15 DIAGNOSIS — M170 Bilateral primary osteoarthritis of knee: Secondary | ICD-10-CM | POA: Diagnosis not present

## 2019-07-15 DIAGNOSIS — Z79899 Other long term (current) drug therapy: Secondary | ICD-10-CM

## 2019-07-15 DIAGNOSIS — M19042 Primary osteoarthritis, left hand: Secondary | ICD-10-CM

## 2019-07-15 DIAGNOSIS — M8589 Other specified disorders of bone density and structure, multiple sites: Secondary | ICD-10-CM

## 2019-07-15 DIAGNOSIS — M5030 Other cervical disc degeneration, unspecified cervical region: Secondary | ICD-10-CM | POA: Diagnosis not present

## 2019-07-15 DIAGNOSIS — F5101 Primary insomnia: Secondary | ICD-10-CM

## 2019-07-15 DIAGNOSIS — M19071 Primary osteoarthritis, right ankle and foot: Secondary | ICD-10-CM | POA: Diagnosis not present

## 2019-07-15 DIAGNOSIS — M0579 Rheumatoid arthritis with rheumatoid factor of multiple sites without organ or systems involvement: Secondary | ICD-10-CM | POA: Diagnosis not present

## 2019-07-15 DIAGNOSIS — M19041 Primary osteoarthritis, right hand: Secondary | ICD-10-CM | POA: Diagnosis not present

## 2019-07-15 DIAGNOSIS — H5789 Other specified disorders of eye and adnexa: Secondary | ICD-10-CM

## 2019-07-15 DIAGNOSIS — Z8719 Personal history of other diseases of the digestive system: Secondary | ICD-10-CM

## 2019-07-15 DIAGNOSIS — Z8639 Personal history of other endocrine, nutritional and metabolic disease: Secondary | ICD-10-CM

## 2019-07-15 DIAGNOSIS — M19072 Primary osteoarthritis, left ankle and foot: Secondary | ICD-10-CM

## 2019-07-15 DIAGNOSIS — E785 Hyperlipidemia, unspecified: Secondary | ICD-10-CM

## 2019-07-15 NOTE — Patient Instructions (Signed)
Standing Labs We placed an order today for your standing lab work.    Please come back and get your standing labs in October and every 3 months   We have open lab daily Monday through Thursday from 8:30-12:30 PM and 1:30-4:30 PM and Friday from 8:30-12:30 PM and 1:30 -4:00 PM at the office of Dr. Modestine Scherzinger.   You may experience shorter wait times on Monday and Friday afternoons. The office is located at [address], [address], [place], [place] [address] No appointment is necessary.   Labs are drawn by [facility].  You may receive a bill from [facility] for your lab work.  If you wish to have your labs drawn at another location, please call the office 24 hours in advance to send orders.  If you have any questions regarding directions or hours of operation,  please call [phone].   Just as a reminder please drink plenty of water prior to coming for your lab work. Thanks!   

## 2019-07-19 MED FILL — NEO/POLYMYXIN/DEXAMETH DROP: 3.5-10000-0 | 14 days supply | Qty: 5 | Fill #0

## 2019-08-19 NOTE — Progress Notes (Signed)
Office Visit Note  Patient: Gina Mitchell             Date of Birth: 09-12-1961           MRN: [id number]             PCP: Shirline Frees, MD Referring: Shirline Frees, MD Visit Date: 08/20/2019 Occupation: @GUAROCC @  Subjective:  Pain in both feet    History of Present Illness: Gina Mitchell is a 58 y.o. female with history of seropositive rheumatoid arthritis, osteoarthritis, and DDD.  She is on MTX 0.6 ml sq once weekly and folic acid 1 mg po daily.  She has not missed any doses recently.  She states that she has been under increased rest recently due to her daughter recently getting married in her backyard.  She states that since Sunday she has been experiencing increased pain and swelling in bilateral ankle joints.  She states that her discomfort is worse in the right ankle.  She is been experiencing nocturnal pain and difficulty bearing weight due to discomfort and stiffness.  She has tried taking Motrin for pain relief.  She is also having increased stiffness in both hands but denies any joint swelling.  She is also been having some intermittent discomfort in the left knee joint which is a new concern.      Activities of Daily Living:  Patient reports morning stiffness for 0 minutes.   Patient Denies nocturnal pain.  Difficulty dressing/grooming: Denies Difficulty climbing stairs: Denies Difficulty getting out of chair: Denies Difficulty using hands for taps, buttons, cutlery, and/or writing: Denies  Review of Systems  Constitutional: Positive for fatigue.  HENT: Negative for mouth sores, mouth dryness and nose dryness.   Eyes: Positive for dryness. Negative for pain and visual disturbance.  Respiratory: Negative for cough, hemoptysis, shortness of breath, wheezing and difficulty breathing.   Cardiovascular: Negative for chest pain, palpitations, hypertension and swelling in legs/feet.  Gastrointestinal: Negative for abdominal pain, blood in stool, constipation and  diarrhea.  Endocrine: Negative for increased urination.  Genitourinary: Negative for difficulty urinating and painful urination.  Musculoskeletal: Positive for arthralgias, joint pain and joint swelling. Negative for myalgias, muscle weakness, morning stiffness, muscle tenderness and myalgias.  Skin: Negative for color change, pallor, rash, hair loss, nodules/bumps, redness, skin tightness, ulcers and sensitivity to sunlight.  Allergic/Immunologic: Negative for susceptible to infections.  Neurological: Negative for dizziness, light-headedness, headaches, memory loss and weakness.  Hematological: Negative for bruising/bleeding tendency and swollen glands.  Psychiatric/Behavioral: Negative for depressed mood, confusion and sleep disturbance. The patient is not nervous/anxious.     PMFS History:  Patient Active Problem List   Diagnosis Date Noted  . Primary osteoarthritis of both hands 09/08/2017  . Primary osteoarthritis of both feet 09/08/2017  . Primary osteoarthritis of both knees 09/08/2017  . History of gastroesophageal reflux (GERD) 09/08/2017  . Dyslipidemia 09/08/2017  . Osteopenia of multiple sites 09/08/2017  . Right ankle swelling 06/26/2017  . Rheumatoid arthritis ([place]) 06/26/2017  . DDD (degenerative disc disease), cervical 09/13/2016  . Localized primary carpometacarpal osteoarthritis, right 08/26/2016  . Rotator cuff dysfunction, right 08/26/2016  . Metatarsalgia with a left third MTP synovitis 08/01/2016  . Metatarsal stress fracture of left foot 04/19/2016  . Abnormal EKG 11/06/2014  . Chest pain 06/10/2014  . Plantar fasciitis, bilateral 11/25/2013    Past Medical History:  Diagnosis Date  . Allergic rhinitis   . Esophageal reflux   . Incomplete right bundle branch block   .  Rheumatoid arthritis ([place])   . Situational anxiety     Family History  Problem Relation Age of Onset  . Prostate cancer Father   . Arrhythmia Father   . Breast cancer Sister   .  Hypertension Mother    Past Surgical History:  Procedure Laterality Date  . BREAST EXCISIONAL BIOPSY    . Breast mass removal     Social History   Social History Narrative  . Not on file    There is no immunization history on file for this patient.   Objective: Vital Signs: BP 117/82 (BP Location: Left Arm, Patient Position: Sitting, Cuff Size: Normal)   Pulse (!) 58   Resp 13   Ht 5\' 2"  (1.575 m)   Wt 125 lb 9.6 oz (57 kg)   BMI 22.97 kg/m    Physical Exam Vitals signs and nursing note reviewed.  Constitutional:      Appearance: She is well-developed.  HENT:     Head: Normocephalic and atraumatic.  Eyes:     Conjunctiva/sclera: Conjunctivae normal.  Neck:     Musculoskeletal: Normal range of motion.  Cardiovascular:     Rate and Rhythm: Normal rate and regular rhythm.     Heart sounds: Normal heart sounds.  Pulmonary:     Effort: Pulmonary effort is normal.     Breath sounds: Normal breath sounds.  Abdominal:     General: Bowel sounds are normal.     Palpations: Abdomen is soft.  Lymphadenopathy:     Cervical: No cervical adenopathy.  Skin:    General: Skin is warm and dry.     Capillary Refill: Capillary refill takes less than 2 seconds.  Neurological:     Mental Status: She is alert and oriented to person, place, and time.  Psychiatric:        Behavior: Behavior normal.      Musculoskeletal Exam: C-spine, thoracic spine, and lumbar spine good ROM.  No midline spinal tenderness.  No SI joint tenderness.  Shoulder joints, elbow joints, wrist joints, MCPs, PIPs, and DIPs good ROM with no synovitis.  Hip joints and knee joints good ROM with no discomfort.  No warmth or effusion of knee joints.  Tenderness and inflammation of both ankle joints.  No tenderness of MTP joints.    CDAI Exam: CDAI Score: 1.2  Patient Global: 6 mm; Provider Global: 6 mm Swollen: 2 ; Tender: 2  Joint Exam      Right  Left  Ankle  Swollen Tender  Swollen Tender      Investigation: No additional findings.  Imaging: No results found.  Recent Labs: Lab Results  Component Value Date   WBC 6.6 03/11/2019   HGB 14.1 03/11/2019   PLT 256 03/11/2019   NA 142 03/11/2019   K 4.1 03/11/2019   CL 105 03/11/2019   CO2 28 03/11/2019   GLUCOSE 93 03/11/2019   BUN 16 03/11/2019   CREATININE 0.91 03/11/2019   BILITOT 0.5 03/11/2019   ALKPHOS 79 06/26/2017   AST 21 03/11/2019   ALT 11 03/11/2019   PROT 6.9 03/11/2019   ALBUMIN 4.1 06/26/2017   CALCIUM 9.8 03/11/2019   GFRAA 81 03/11/2019   QFTBGOLD NEGATIVE 08/14/2017    Speciality Comments: No specialty comments available.  Procedures:  No procedures performed Allergies: Crab [shellfish allergy]   Assessment / Plan:     Visit Diagnoses: Rheumatoid arthritis involving multiple sites with positive rheumatoid factor ([place]) - She has tenderness and synovitis of bilateral ankle  joints.  She has been having increased pain and inflammation for the past 3 days, which she attributes to increased stress.  She woke up Sunday and was having difficulty bearing weight and with plantarflexion of both ankle joints.  She has been experiencing increased stiffness in both hands but no synovitis or tenderness was noted.  She has been injecting MTX 0.6 ml sq once weekly and taking folic acid 1 mg po daily.  She was advised to increased MTX to 0.8 ml sq once weekly and folic acid 2 mg po daily.  A prednisone taper starting at 10 mg tapering by 2.5 mg every 4 days was sent to the pharmacy.  She was advised to notify us if her joint pain and inflammation persists.  She will follow up in 3 months.   High risk medication use - Methotrexate 0.8 ml every 7 days and folic acid 1 mg 2 tablets daily.  CBC and CMP WNL on 03/11/19.  She is overdue to update lab work.  She will return this week for lab work.  Future orders were placed today.- Plan: CBC with Differential/Platelet, COMPLETE METABOLIC PANEL WITH GFR  Eye inflammation -  Referred to Dr. Manuella Ghazi. She has an upcoming appointment on 09/05/19.   Primary osteoarthritis of both hands: She has been experiencing increased stiffness in both hands.  She has no synovitis or tenderness on exam.  She has complete fist formation bilaterally.  Joint protection and muscle strengthening were discussed.   Primary osteoarthritis of both knees: She has good ROM of both knee joints.  No warmth or effusion of knee joints.  She has no difficulty climbing steps or getting up from a chair.   Primary osteoarthritis of both feet: She has no feet pain or joint swelling.   DDD (degenerative disc disease), cervical: She has good ROM with no discomfort.  No symptoms of radiculopathy.    Other fatigue: She has chronic fatigue which has been stable.   Primary insomnia: She has had interrupted sleep at night due to the discomfort she has been experiencing.   Osteopenia of multiple sites: She is taking a vitamin D supplement.   Other medical conditions are listed as follows:   History of hyperlipidemia  Dyslipidemia  History of gastroesophageal reflux (GERD)  Orders: Orders Placed This Encounter  Procedures  . CBC with Differential/Platelet  . COMPLETE METABOLIC PANEL WITH GFR   Meds ordered this encounter  Medications  . predniSONE (DELTASONE) 5 MG tablet    Sig: Take 2 tablets by mouth daily x4 days, 1.5 tablets by mouth daily x4 days, 1 tablet by mouth daily x4 days, 1/2 tablet by mouth daily x4 days.    Dispense:  20 tablet    Refill:  0  . methotrexate 50 MG/2ML injection    Sig: Inject 0.8 mLs (20 mg total) into the skin once a week.    Dispense:  10 mL    Refill:  0  . folic acid (FOLVITE) 1 MG tablet    Sig: Take 2 tablets (2 mg total) by mouth daily.    Dispense:  180 tablet    Refill:  3    Face-to-face time spent with patient was 30 minutes. Greater than 50% of time was spent in counseling and coordination of care.  Follow-Up Instructions: Return in about 3  months (around 11/20/2019) for Rheumatoid arthritis, Osteoarthritis, DDD.   Hazel Sams, PA-C  I examined and evaluated the patient with Hazel Sams PA.  Patient is having  a flare today with synovitis in multiple joints as described above.  She has decreased her methotrexate dose herself.  A short prednisone taper was given and side effects were discussed.  We will increase methotrexate dose to 0.8 mL subcu weekly.  She was in agreement.  We will see her back in 3 months.  The plan of care was discussed as noted above.  Bo Merino, MD  Note - This record has been created using Editor, commissioning.  Chart creation errors have been sought, but may not always  have been located. Such creation errors do not reflect on  the standard of medical care.

## 2019-08-20 ENCOUNTER — Ambulatory Visit: Payer: 59 | Admitting: Rheumatology

## 2019-08-20 ENCOUNTER — Encounter: Payer: Self-pay | Admitting: Rheumatology

## 2019-08-20 ENCOUNTER — Other Ambulatory Visit: Payer: Self-pay

## 2019-08-20 VITALS — BP 117/82 | HR 58 | Resp 13 | Ht 62.0 in | Wt 125.6 lb

## 2019-08-20 DIAGNOSIS — E785 Hyperlipidemia, unspecified: Secondary | ICD-10-CM

## 2019-08-20 DIAGNOSIS — M170 Bilateral primary osteoarthritis of knee: Secondary | ICD-10-CM | POA: Diagnosis not present

## 2019-08-20 DIAGNOSIS — F5101 Primary insomnia: Secondary | ICD-10-CM | POA: Diagnosis not present

## 2019-08-20 DIAGNOSIS — H5789 Other specified disorders of eye and adnexa: Secondary | ICD-10-CM | POA: Diagnosis not present

## 2019-08-20 DIAGNOSIS — M19042 Primary osteoarthritis, left hand: Secondary | ICD-10-CM

## 2019-08-20 DIAGNOSIS — Z8719 Personal history of other diseases of the digestive system: Secondary | ICD-10-CM

## 2019-08-20 DIAGNOSIS — M0579 Rheumatoid arthritis with rheumatoid factor of multiple sites without organ or systems involvement: Secondary | ICD-10-CM | POA: Diagnosis not present

## 2019-08-20 DIAGNOSIS — R5383 Other fatigue: Secondary | ICD-10-CM | POA: Diagnosis not present

## 2019-08-20 DIAGNOSIS — Z8639 Personal history of other endocrine, nutritional and metabolic disease: Secondary | ICD-10-CM

## 2019-08-20 DIAGNOSIS — Z79899 Other long term (current) drug therapy: Secondary | ICD-10-CM

## 2019-08-20 DIAGNOSIS — M19071 Primary osteoarthritis, right ankle and foot: Secondary | ICD-10-CM | POA: Diagnosis not present

## 2019-08-20 DIAGNOSIS — M8589 Other specified disorders of bone density and structure, multiple sites: Secondary | ICD-10-CM

## 2019-08-20 DIAGNOSIS — M19041 Primary osteoarthritis, right hand: Secondary | ICD-10-CM | POA: Diagnosis not present

## 2019-08-20 DIAGNOSIS — M19072 Primary osteoarthritis, left ankle and foot: Secondary | ICD-10-CM

## 2019-08-20 DIAGNOSIS — M5030 Other cervical disc degeneration, unspecified cervical region: Secondary | ICD-10-CM

## 2019-08-20 MED ORDER — METHOTREXATE SODIUM CHEMO INJECTION 50 MG/2ML: 20.0000 mg | INTRAMUSCULAR | 0 refills | Status: DC

## 2019-08-20 MED ORDER — FOLIC ACID 1 MG PO TABS: 2.0000 mg | ORAL_TABLET | Freq: Every day | ORAL | 3 refills | Status: DC

## 2019-08-20 MED ORDER — PREDNISONE 5 MG PO TABS: ORAL_TABLET | ORAL | 0 refills | Status: DC

## 2019-08-20 MED FILL — predniSONE 5 MG TABS: 5 | 20 days supply | Qty: 20 | Fill #0

## 2019-08-20 MED FILL — METHOTREXATE 25 MG/ML VIAL: 50 | 70 days supply | Qty: 10 | Fill #0

## 2019-08-20 MED FILL — FOLIC ACID 1 MG TABS: 1 | 90 days supply | Qty: 180 | Fill #0

## 2019-08-20 NOTE — Patient Instructions (Signed)
Increase folic acid to 2 mg by mouth daily   Standing Labs We placed an order today for your standing lab work.    Please come back and get your standing labs this week and every 3 months   We have open lab daily Monday through Thursday from 8:30-12:30 PM and 1:30-4:30 PM and Friday from 8:30-12:30 PM and 1:30-4:00 PM at the office of Dr. Bo Merino.   You may experience shorter wait times on Monday and Friday afternoons. The office is located at [address], [place], [place], [place] [address] No appointment is necessary.   Labs are drawn by [facility].  You may receive a bill from [facility] for your lab work.  If you wish to have your labs drawn at another location, please call the office 24 hours in advance to send orders.  If you have any questions regarding directions or hours of operation,  please call [phone].   Just as a reminder please drink plenty of water prior to coming for your lab work. Thanks!

## 2019-08-22 ENCOUNTER — Other Ambulatory Visit: Payer: Self-pay | Admitting: *Deleted

## 2019-08-22 DIAGNOSIS — Z79899 Other long term (current) drug therapy: Secondary | ICD-10-CM

## 2019-08-23 LAB — CBC WITH DIFFERENTIAL/PLATELET
Absolute Monocytes: 312 cells/uL (ref 200–950)
Basophils Absolute: 90 cells/uL (ref 0–200)
Basophils Relative: 1.5 %
Eosinophils Absolute: 150 cells/uL (ref 15–500)
Eosinophils Relative: 2.5 %
HCT: 40.7 % (ref 35.0–45.0)
Hemoglobin: 13.7 g/dL (ref 11.7–15.5)
Lymphs Abs: 2556 cells/uL (ref 850–3900)
MCH: 31.9 pg (ref 27.0–33.0)
MCHC: 33.7 g/dL (ref 32.0–36.0)
MCV: 94.9 fL (ref 80.0–100.0)
MPV: 10.2 fL (ref 7.5–12.5)
Monocytes Relative: 5.2 %
Neutro Abs: 2892 cells/uL (ref 1500–7800)
Neutrophils Relative %: 48.2 %
Platelets: 275 10*3/uL (ref 140–400)
RBC: 4.29 10*6/uL (ref 3.80–5.10)
RDW: 13.2 % (ref 11.0–15.0)
Total Lymphocyte: 42.6 %
WBC: 6 10*3/uL (ref 3.8–10.8)

## 2019-08-23 LAB — COMPLETE METABOLIC PANEL WITH GFR
AG Ratio: 1.6 (calc) (ref 1.0–2.5)
ALT: 9 U/L (ref 6–29)
AST: 18 U/L (ref 10–35)
Albumin: 4.2 g/dL (ref 3.6–5.1)
Alkaline phosphatase (APISO): 61 U/L (ref 37–153)
BUN: 19 mg/dL (ref 7–25)
CO2: 29 mmol/L (ref 20–32)
Calcium: 9.4 mg/dL (ref 8.6–10.4)
Chloride: 103 mmol/L (ref 98–110)
Creat: 0.79 mg/dL (ref 0.50–1.05)
GFR, Est African American: 96 mL/min/{1.73_m2} (ref >=60)
GFR, Est Non African American: 83 mL/min/{1.73_m2} (ref >=60)
Globulin: 2.6 g/dL (calc) (ref 1.9–3.7)
Glucose, Bld: 93 mg/dL (ref 65–139)
Potassium: 4.6 mmol/L (ref 3.5–5.3)
Sodium: 140 mmol/L (ref 135–146)
Total Bilirubin: 0.6 mg/dL (ref 0.2–1.2)
Total Protein: 6.8 g/dL (ref 6.1–8.1)

## 2019-08-23 NOTE — Progress Notes (Signed)
CBC and CMP WNL

## 2019-08-24 DIAGNOSIS — H43811 Vitreous degeneration, right eye: Secondary | ICD-10-CM | POA: Diagnosis not present

## 2019-08-27 ENCOUNTER — Telehealth: Payer: Self-pay | Admitting: *Deleted

## 2019-08-27 NOTE — Telephone Encounter (Signed)
Patient states she is due to get her flu vaccine this week. Patient wanted to know if she is okay to get her vaccination. Patient advised she is okay to get her vaccination and is highly recommended.

## 2019-09-05 DIAGNOSIS — H2513 Age-related nuclear cataract, bilateral: Secondary | ICD-10-CM | POA: Diagnosis not present

## 2019-09-05 DIAGNOSIS — M069 Rheumatoid arthritis, unspecified: Secondary | ICD-10-CM | POA: Diagnosis not present

## 2019-09-05 DIAGNOSIS — Z79899 Other long term (current) drug therapy: Secondary | ICD-10-CM | POA: Diagnosis not present

## 2019-09-05 DIAGNOSIS — H3581 Retinal edema: Secondary | ICD-10-CM | POA: Diagnosis not present

## 2019-10-11 ENCOUNTER — Other Ambulatory Visit: Payer: Self-pay

## 2019-10-11 DIAGNOSIS — Z79899 Other long term (current) drug therapy: Secondary | ICD-10-CM

## 2019-10-12 LAB — CBC WITH DIFFERENTIAL/PLATELET
Absolute Monocytes: 446 cells/uL (ref 200–950)
Basophils Absolute: 87 cells/uL (ref 0–200)
Basophils Relative: 1.4 %
Eosinophils Absolute: 229 cells/uL (ref 15–500)
Eosinophils Relative: 3.7 %
HCT: 40.1 % (ref 35.0–45.0)
Hemoglobin: 13.8 g/dL (ref 11.7–15.5)
Lymphs Abs: 2009 cells/uL (ref 850–3900)
MCH: 32.5 pg (ref 27.0–33.0)
MCHC: 34.4 g/dL (ref 32.0–36.0)
MCV: 94.4 fL (ref 80.0–100.0)
MPV: 10.3 fL (ref 7.5–12.5)
Monocytes Relative: 7.2 %
Neutro Abs: 3429 cells/uL (ref 1500–7800)
Neutrophils Relative %: 55.3 %
Platelets: 252 10*3/uL (ref 140–400)
RBC: 4.25 10*6/uL (ref 3.80–5.10)
RDW: 13.2 % (ref 11.0–15.0)
Total Lymphocyte: 32.4 %
WBC: 6.2 10*3/uL (ref 3.8–10.8)

## 2019-10-12 LAB — COMPLETE METABOLIC PANEL WITH GFR
AG Ratio: 1.6 (calc) (ref 1.0–2.5)
ALT: 7 U/L (ref 6–29)
AST: 18 U/L (ref 10–35)
Albumin: 4.1 g/dL (ref 3.6–5.1)
Alkaline phosphatase (APISO): 68 U/L (ref 37–153)
BUN: 15 mg/dL (ref 7–25)
CO2: 27 mmol/L (ref 20–32)
Calcium: 9.3 mg/dL (ref 8.6–10.4)
Chloride: 105 mmol/L (ref 98–110)
Creat: 0.85 mg/dL (ref 0.50–1.05)
GFR, Est African American: 88 mL/min/{1.73_m2} (ref >=60)
GFR, Est Non African American: 76 mL/min/{1.73_m2} (ref >=60)
Globulin: 2.6 g/dL (calc) (ref 1.9–3.7)
Glucose, Bld: 84 mg/dL (ref 65–139)
Potassium: 4 mmol/L (ref 3.5–5.3)
Sodium: 142 mmol/L (ref 135–146)
Total Bilirubin: 0.6 mg/dL (ref 0.2–1.2)
Total Protein: 6.7 g/dL (ref 6.1–8.1)

## 2019-10-14 NOTE — Progress Notes (Signed)
CBC and CMP WNL

## 2019-11-11 ENCOUNTER — Other Ambulatory Visit: Payer: Self-pay | Admitting: Rheumatology

## 2019-11-11 DIAGNOSIS — M0579 Rheumatoid arthritis with rheumatoid factor of multiple sites without organ or systems involvement: Secondary | ICD-10-CM

## 2019-11-11 MED FILL — METHOTREXATE 25 MG/ML VIAL: 50 | 87 days supply | Qty: 10 | Fill #0

## 2019-11-11 NOTE — Telephone Encounter (Signed)
Last Visit: 08/20/19 Next Visit: 11/19/19 Labs: 10/11/19 WNL  Okay to refill per Dr. Estanislado Pandy

## 2019-11-18 NOTE — Progress Notes (Signed)
Virtual Visit via Telephone Note  I connected with Gina Mitchell on 11/21/19 at  8:30 AM EST by telephone and verified that I am speaking with the correct person using two identifiers.  Location: Patient: Home  Provider: Clinic   This service was conducted via virtual visit. The patient was located at home. I was located in my office.  Consent was obtained prior to the virtual visit and is aware of possible charges through their insurance for this visit.  The patient is an established patient.  Dr. Estanislado Pandy, MD conducted the virtual visit and Hazel Sams, PA-C acted as scribe during the service.  Office staff helped with scheduling follow up visits after the service was conducted.    I discussed the limitations, risks, security and privacy concerns of performing an evaluation and management service by telephone and the availability of in person appointments. I also discussed with the patient that there may be a patient responsible charge related to this service. The patient expressed understanding and agreed to proceed.  CC: Discuss covid-19 vaccine  History of Present Illness: Gina Mitchell is a 59 y.o. female with history of seropositive rheumatoid arthritis, osteoarthritis, and DDD.  She is on MTX 0.8 ml sq once weekly and folic acid 1 mg po daily.  She experiences intermittent pain and inflammation in both ankle joints.  She has noticed improvement since increasing the dose of MTX.   She is apprehensive to receive the covid-19 vaccine while taking MTX.    Review of Systems  Constitutional: Positive for malaise/fatigue. Negative for fever.  HENT: Negative for congestion.   Eyes: Negative for photophobia, pain, discharge and redness.  Respiratory: Negative for cough, shortness of breath and wheezing.   Cardiovascular: Negative for chest pain and palpitations.  Gastrointestinal: Positive for constipation. Negative for blood in stool and diarrhea.  Genitourinary: Negative for dysuria and  frequency.  Musculoskeletal: Positive for joint pain. Negative for back pain, myalgias and neck pain.  Skin: Negative for rash.  Neurological: Negative for dizziness, weakness and headaches.  Endo/Heme/Allergies: Does not bruise/bleed easily.  Psychiatric/Behavioral: Negative for depression and memory loss. The patient is not nervous/anxious and does not have insomnia.      Observations/Objective:  Physical Exam  Constitutional: She is oriented to person, place, and time.  Neurological: She is alert and oriented to person, place, and time.  Psychiatric: Mood, memory, affect and judgment normal.     Patient reports morning stiffness for 0 none.   Patient denies nocturnal pain.  Difficulty dressing/grooming: Denies Difficulty climbing stairs: Denies Difficulty getting out of chair: Denies Difficulty using hands for taps, buttons, cutlery, and/or writing: Denies  Assessment and Plan: Visit Diagnoses: Rheumatoid arthritis involving multiple sites with positive rheumatoid factor ([place]) -She has intermittent tenderness and inflammation in both ankle joints. She is not having any nocturnal pain or morning stiffness at this time.  She has no difficulty with ADLs. Overall she has noticed clinical improvement since increasing the dose of Methotrexate to 0.8 ml sq once weekly and folic acid 2 mg po daily.  She will continue on the current treatment regimen. She was advised to notify us if she develops increased joint pain or joint swelling.  She will follow up in 5 months.   High risk medication use - Methotrexate 0.8 ml every 7 days and folic acid 1 mg 2 tablets daily. We discussed in detail the importance of receiving the covid-19 vaccine once it is available to her. CBC and CMP were WNL  on 10/11/19.  She will be due to update labs in March and every 3 months.   Retinal edema: She was evaluated by Dr. Manuella Ghazi on 09/05/19 and diagnosed with retinal edema.  According to his office visit note there was  no signs of active inflammation on ocular exam.  He did not recommend any treatment at that time.  She will be following up with Dr. Manuella Ghazi in 6 months.    Primary osteoarthritis of both hands: She has no hand pain or joint inflammation at this time.  Joint protection and muscle strengthening were discussed.   Primary osteoarthritis of both knees : She has no knee joint pain or inflammation currently.    Primary osteoarthritis of both feet: She has intermittent tenderness and inflammation in her ankle joints.    DDD (degenerative disc disease), cervical: She has no neck pain or stiffness at this time.  No symptoms of radiculopathy.   Other fatigue: She has chronic fatigue which has been stable.   Primary insomnia: She has been sleeping well overall.   Osteopenia of multiple sites: She is taking a vitamin D supplement.   Other medical conditions are listed as follows:   History of hyperlipidemia  Dyslipidemia  History of gastroesophageal reflux (GERD) Follow Up Instructions: She will follow up in 5 months.    I discussed the assessment and treatment plan with the patient. The patient was provided an opportunity to ask questions and all were answered. The patient agreed with the plan and demonstrated an understanding of the instructions.   The patient was advised to call back or seek an in-person evaluation if the symptoms worsen or if the condition fails to improve as anticipated.  I provided 25 minutes of non-face-to-face time during this encounter.  Bo Merino, MD   Scribed byLovena Le Dale,PA-C

## 2019-11-19 ENCOUNTER — Ambulatory Visit: Payer: 59 | Admitting: Rheumatology

## 2019-11-20 DIAGNOSIS — H5213 Myopia, bilateral: Secondary | ICD-10-CM | POA: Diagnosis not present

## 2019-11-20 DIAGNOSIS — H524 Presbyopia: Secondary | ICD-10-CM | POA: Diagnosis not present

## 2019-11-21 ENCOUNTER — Other Ambulatory Visit: Payer: Self-pay

## 2019-11-21 ENCOUNTER — Encounter: Payer: Self-pay | Admitting: Rheumatology

## 2019-11-21 ENCOUNTER — Telehealth (INDEPENDENT_AMBULATORY_CARE_PROVIDER_SITE_OTHER): Payer: 59 | Admitting: Rheumatology

## 2019-11-21 DIAGNOSIS — M0579 Rheumatoid arthritis with rheumatoid factor of multiple sites without organ or systems involvement: Secondary | ICD-10-CM | POA: Diagnosis not present

## 2019-11-21 DIAGNOSIS — M19071 Primary osteoarthritis, right ankle and foot: Secondary | ICD-10-CM | POA: Diagnosis not present

## 2019-11-21 DIAGNOSIS — M5030 Other cervical disc degeneration, unspecified cervical region: Secondary | ICD-10-CM

## 2019-11-21 DIAGNOSIS — Z8639 Personal history of other endocrine, nutritional and metabolic disease: Secondary | ICD-10-CM

## 2019-11-21 DIAGNOSIS — M19041 Primary osteoarthritis, right hand: Secondary | ICD-10-CM

## 2019-11-21 DIAGNOSIS — M170 Bilateral primary osteoarthritis of knee: Secondary | ICD-10-CM

## 2019-11-21 DIAGNOSIS — Z79899 Other long term (current) drug therapy: Secondary | ICD-10-CM

## 2019-11-21 DIAGNOSIS — M19042 Primary osteoarthritis, left hand: Secondary | ICD-10-CM

## 2019-11-21 DIAGNOSIS — M19072 Primary osteoarthritis, left ankle and foot: Secondary | ICD-10-CM

## 2019-11-21 DIAGNOSIS — F5101 Primary insomnia: Secondary | ICD-10-CM

## 2019-11-21 DIAGNOSIS — R5383 Other fatigue: Secondary | ICD-10-CM | POA: Diagnosis not present

## 2019-11-21 DIAGNOSIS — E785 Hyperlipidemia, unspecified: Secondary | ICD-10-CM

## 2019-11-21 DIAGNOSIS — M8589 Other specified disorders of bone density and structure, multiple sites: Secondary | ICD-10-CM

## 2019-11-21 DIAGNOSIS — Z8719 Personal history of other diseases of the digestive system: Secondary | ICD-10-CM

## 2019-11-21 DIAGNOSIS — H3581 Retinal edema: Secondary | ICD-10-CM | POA: Diagnosis not present

## 2019-11-22 ENCOUNTER — Telehealth: Payer: Self-pay | Admitting: Rheumatology

## 2019-11-22 NOTE — Telephone Encounter (Signed)
I called patient, patient advised it is safe to receive the COVID vaccine.

## 2019-11-22 NOTE — Telephone Encounter (Signed)
Patient called requesting a return call to let her know if it is okay for her to have her COVID vaccine on Monday, 11/25/19 since she took her Methotrexate injection today 11/22/19.

## 2019-12-16 ENCOUNTER — Ambulatory Visit: Payer: 59 | Admitting: Rheumatology

## 2019-12-16 ENCOUNTER — Telehealth: Payer: Self-pay | Admitting: Rheumatology

## 2019-12-16 NOTE — Telephone Encounter (Signed)
Patient wants to know if it is okay to have next COVID vaccine today at 10:00am. Patient had MTX injection yesterday. Please call to advise.

## 2019-12-16 NOTE — Telephone Encounter (Signed)
Advised patient that the COVID-19 vaccine is not live, therefore she did not need to hold medication prior to receiving it. Patient verbalized understanding.

## 2019-12-23 ENCOUNTER — Telehealth: Payer: Self-pay | Admitting: *Deleted

## 2019-12-23 DIAGNOSIS — J0100 Acute maxillary sinusitis, unspecified: Secondary | ICD-10-CM | POA: Diagnosis not present

## 2019-12-23 DIAGNOSIS — F419 Anxiety disorder, unspecified: Secondary | ICD-10-CM | POA: Diagnosis not present

## 2019-12-23 DIAGNOSIS — H6983 Other specified disorders of Eustachian tube, bilateral: Secondary | ICD-10-CM | POA: Diagnosis not present

## 2019-12-23 MED FILL — AZITHROMYCIN 250 MG TABLET: 250 | 5 days supply | Qty: 6 | Fill #0

## 2019-12-23 MED FILL — ALPRAZolam 0.25 MG TABS: 0.25 | 15 days supply | Qty: 30 | Fill #0

## 2019-12-23 NOTE — Telephone Encounter (Signed)
Patient contacted the office stating she has seen her PCP and was given a z-pak for her sinuses and an ear infection. Patient is on MTX. Patient advised to hold MTX until she completes the antibiotic and is well.

## 2020-01-24 ENCOUNTER — Other Ambulatory Visit: Payer: Self-pay

## 2020-01-24 DIAGNOSIS — Z79899 Other long term (current) drug therapy: Secondary | ICD-10-CM | POA: Diagnosis not present

## 2020-01-25 LAB — COMPLETE METABOLIC PANEL WITH GFR
AG Ratio: 1.9 (calc) (ref 1.0–2.5)
ALT: 13 U/L (ref 6–29)
AST: 23 U/L (ref 10–35)
Albumin: 4.6 g/dL (ref 3.6–5.1)
Alkaline phosphatase (APISO): 82 U/L (ref 37–153)
BUN: 17 mg/dL (ref 7–25)
CO2: 28 mmol/L (ref 20–32)
Calcium: 9.4 mg/dL (ref 8.6–10.4)
Chloride: 103 mmol/L (ref 98–110)
Creat: 0.8 mg/dL (ref 0.50–1.05)
GFR, Est African American: 94 mL/min/{1.73_m2} (ref >=60)
GFR, Est Non African American: 81 mL/min/{1.73_m2} (ref >=60)
Globulin: 2.4 g/dL (calc) (ref 1.9–3.7)
Glucose, Bld: 88 mg/dL (ref 65–99)
Potassium: 4 mmol/L (ref 3.5–5.3)
Sodium: 140 mmol/L (ref 135–146)
Total Bilirubin: 0.4 mg/dL (ref 0.2–1.2)
Total Protein: 7 g/dL (ref 6.1–8.1)

## 2020-01-25 LAB — CBC WITH DIFFERENTIAL/PLATELET
Absolute Monocytes: 400 cells/uL (ref 200–950)
Basophils Absolute: 81 cells/uL (ref 0–200)
Basophils Relative: 1.1 %
Eosinophils Absolute: 274 cells/uL (ref 15–500)
Eosinophils Relative: 3.7 %
HCT: 42.4 % (ref 35.0–45.0)
Hemoglobin: 14.3 g/dL (ref 11.7–15.5)
Lymphs Abs: 3086 cells/uL (ref 850–3900)
MCH: 32.1 pg (ref 27.0–33.0)
MCHC: 33.7 g/dL (ref 32.0–36.0)
MCV: 95.1 fL (ref 80.0–100.0)
MPV: 10 fL (ref 7.5–12.5)
Monocytes Relative: 5.4 %
Neutro Abs: 3559 cells/uL (ref 1500–7800)
Neutrophils Relative %: 48.1 %
Platelets: 251 10*3/uL (ref 140–400)
RBC: 4.46 10*6/uL (ref 3.80–5.10)
RDW: 13.2 % (ref 11.0–15.0)
Total Lymphocyte: 41.7 %
WBC: 7.4 10*3/uL (ref 3.8–10.8)

## 2020-01-27 NOTE — Progress Notes (Signed)
CBC and CMP WNL

## 2020-02-07 ENCOUNTER — Other Ambulatory Visit: Payer: Self-pay | Admitting: Rheumatology

## 2020-02-07 ENCOUNTER — Telehealth: Payer: Self-pay | Admitting: Rheumatology

## 2020-02-07 DIAGNOSIS — M0579 Rheumatoid arthritis with rheumatoid factor of multiple sites without organ or systems involvement: Secondary | ICD-10-CM

## 2020-02-07 MED FILL — METHOTREXATE 25 MG/ML VIAL: 50 | 87 days supply | Qty: 10 | Fill #0

## 2020-02-07 NOTE — Telephone Encounter (Signed)
Patient left a message requesting a refill on MTX sent to [facility]. 02/07/20

## 2020-02-07 NOTE — Telephone Encounter (Signed)
Attempted to contact the patient and left message to advise prescription has been sent to the pharmacy.

## 2020-02-07 NOTE — Telephone Encounter (Signed)
Last Visit: 11/21/19 Next Visit: 04/27/20 Labs: 01/24/20 WNL  Current Dose per office note on 11/21/19 Methotrexate 0.24ml every 7 days   Okay to refill per Dr. Estanislado Pandy

## 2020-02-10 ENCOUNTER — Encounter: Payer: Self-pay | Admitting: Rheumatology

## 2020-02-11 NOTE — Telephone Encounter (Signed)
I spoke with Dr. Estanislado Pandy and she would like the patient to go 2 years without a flare prior to reducing the dose of MTX.

## 2020-02-20 DIAGNOSIS — H43811 Vitreous degeneration, right eye: Secondary | ICD-10-CM | POA: Diagnosis not present

## 2020-03-02 ENCOUNTER — Encounter: Payer: Self-pay | Admitting: Cardiovascular Disease

## 2020-03-02 ENCOUNTER — Ambulatory Visit: Payer: 59 | Admitting: Cardiovascular Disease

## 2020-03-02 ENCOUNTER — Other Ambulatory Visit: Payer: Self-pay

## 2020-03-02 VITALS — BP 146/90 | HR 76 | Ht 62.0 in | Wt 129.0 lb

## 2020-03-02 DIAGNOSIS — R42 Dizziness and giddiness: Secondary | ICD-10-CM | POA: Diagnosis not present

## 2020-03-02 NOTE — Progress Notes (Signed)
Chief Complaint  Patient presents with  . Follow-up   History of Present Illness: 59 yo female with history of rheumatoid arthritis, hyperlipidemia, GERD and anxiety who is here today for follow up. I saw her as a new consult in June 2015 for the evaluation of chest pain. Echo June 2015 with normal LV size and function, no valve issues. Exercise stress test August 2015 with no ischemic changes. Her pain was felt to be GI related and she was continued on a PPI. She was diagnosed with rheumatoid arthritis in 2018. Echo May 2019 with normal LV size and function with LVEF=65-70%. No valve disease. She is an Adult nurse.  She is here today for follow up. The patient denies any chest pain, palpitations, lower extremity edema, orthopnea, PND, near syncope or syncope. She has some dizziness but is very active and exercise several times per week. No exertional symptoms. Occasional dyspnea.   Primary Care Physician: Shirline Frees, MD  Past Medical History:  Diagnosis Date  . Allergic rhinitis   . Esophageal reflux   . Incomplete right bundle branch block   . Rheumatoid arthritis ([place])   . Situational anxiety     Past Surgical History:  Procedure Laterality Date  . BREAST EXCISIONAL BIOPSY    . Breast mass removal      Current Outpatient Medications  Medication Sig Dispense Refill  . Cholecalciferol (VITAMIN D3 PO) Take by mouth 1 day or 1 dose.    . Cholecalciferol (VITAMIN D3) 100000 UNIT/GM POWD Take by mouth.    . Famotidine (PEPCID PO) Take by mouth as needed.    . famotidine (PEPCID) 10 MG tablet Take by mouth.    . folic acid (FOLVITE) 1 MG tablet Take 2 tablets (2 mg total) by mouth daily. 180 tablet 3  . ibuprofen (ADVIL,MOTRIN) 200 MG tablet Take 200 mg by mouth as needed.    . methotrexate 50 MG/2ML injection INJECT 0.8MLS INTO THE SKIN ONCE A WEEK 10 mL 0  . Probiotic CAPS Probiotic    . sharps container 1 each by Does not apply route as needed. 1 each 6  .  Tuberculin-Allergy Syringes 27G X 1/2" 1 ML MISC Patient to use to inject Methotrexate once weekly 12 each 3  . VITAMIN E PO vitamin E     No current facility-administered medications for this visit.    Allergies  Allergen Reactions  . Crab [Shellfish Allergy]     Social History   Socioeconomic History  . Marital status: Married    Spouse name: Not on file  . Number of children: 3  . Years of education: Not on file  . Highest education level: Not on file  Occupational History  . Occupation: Programmer, multimedia: [employer]  Tobacco Use  . Smoking status: Never Smoker  . Smokeless tobacco: Never Used  Substance and Sexual Activity  . Alcohol use: No  . Drug use: No  . Sexual activity: Not on file  Other Topics Concern  . Not on file  Social History Narrative  . Not on file   Social Determinants of Health   Financial Resource Strain:   . Difficulty of Paying Living Expenses:   Food Insecurity:   . Worried About Charity fundraiser in the Last Year:   . Arboriculturist in the Last Year:   Transportation Needs:   . Film/video editor (Medical):   Marland Kitchen Lack of Transportation (Non-Medical):   Physical Activity:   .  Days of Exercise per Week:   . Minutes of Exercise per Session:   Stress:   . Feeling of Stress :   Social Connections:   . Frequency of Communication with Friends and Family:   . Frequency of Social Gatherings with Friends and Family:   . Attends Religious Services:   . Active Member of Clubs or Organizations:   . Attends Archivist Meetings:   Marland Kitchen Marital Status:   Intimate Partner Violence:   . Fear of Current or Ex-Partner:   . Emotionally Abused:   Marland Kitchen Physically Abused:   . Sexually Abused:     Family History  Problem Relation Age of Onset  . Prostate cancer Father   . Arrhythmia Father   . Breast cancer Sister   . Hypertension Mother     Review of Systems:  As stated in the HPI and otherwise negative.   BP (!) 146/90   Pulse 76    Ht 5\' 2"  (1.575 m)   Wt 129 lb (58.5 kg)   SpO2 99%   BMI 23.59 kg/m   Physical Examination:  General: Well developed, well nourished, NAD  HEENT: OP clear, mucus membranes moist  SKIN: warm, dry. No rashes. Neuro: No focal deficits  Musculoskeletal: Muscle strength 5/5 all ext  Psychiatric: Mood and affect normal  Neck: No JVD, no carotid bruits, no thyromegaly, no lymphadenopathy.  Lungs:Clear bilaterally, no wheezes, rhonci, crackles Cardiovascular: Regular rate and rhythm. No murmurs, gallops or rubs. Abdomen:Soft. Bowel sounds present. Non-tender.  Extremities: No lower extremity edema. Pulses are 2 + in the bilateral DP/PT.  EKG:  EKG is ordered today. The ekg ordered today demonstrates Sinus, no changes from prior EKG. NSSTTWA  Recent Labs: 04/02/2019: TSH 2.24 01/24/2020: ALT 13; BUN 17; Creat 0.80; Hemoglobin 14.3; Platelets 251; Potassium 4.0; Sodium 140   Lipid Panel No results found for: CHOL, TRIG, HDL, CHOLHDL, VLDL, LDLCALC, LDLDIRECT   Wt Readings from Last 3 Encounters:  03/02/20 129 lb (58.5 kg)  08/20/19 125 lb 9.6 oz (57 kg)  07/15/19 127 lb (57.6 kg)     Other studies Reviewed: Additional studies/ records that were reviewed today include:  Review of the above records demonstrates:   Assessment and Plan:   1. Dizziness: She has a normal cardiac exam. She is known to have normal LV function by echo in 2019. No carotid bruits. Likely due to inner ear issues. No cardiac workup indicated.    Current medicines are reviewed at length with the patient today.  The patient does not have concerns regarding medicines.  The following changes have been made:  no change  Labs/ tests ordered today include:   Orders Placed This Encounter  Procedures  . EKG 12-Lead     Disposition:   FU with me in  24  months   Signed, Lauree Chandler, MD 03/02/2020 11:06 AM    [facility] [place], [place], [place]  [address] Phone:  [phone]; Fax: [phone]

## 2020-03-02 NOTE — Patient Instructions (Signed)
Medication Instructions:  No changes *If you need a refill on your cardiac medications before your next appointment, please call your pharmacy*   Lab Work: none If you have labs (blood work) drawn today and your tests are completely normal, you will receive your results only by: Marland Kitchen MyChart Message (if you have MyChart) OR . A paper copy in the mail If you have any lab test that is abnormal or we need to change your treatment, we will call you to review the results.   Testing/Procedures: none   Follow-Up: At [facility], you and your health needs are our priority.  As part of our continuing mission to provide you with exceptional heart care, we have created designated Provider Care Teams.  These Care Teams include your primary Cardiologist (physician) and Advanced Practice Providers (APPs -  Physician Assistants and Nurse Practitioners) who all work together to provide you with the care you need, when you need it.  Your next appointment:   24 month(s)  The format for your next appointment:   In Person  Provider:   Lauree Chandler, MD   Other Instructions

## 2020-03-09 DIAGNOSIS — J302 Other seasonal allergic rhinitis: Secondary | ICD-10-CM | POA: Diagnosis not present

## 2020-03-09 DIAGNOSIS — H9313 Tinnitus, bilateral: Secondary | ICD-10-CM | POA: Diagnosis not present

## 2020-03-17 DIAGNOSIS — M069 Rheumatoid arthritis, unspecified: Secondary | ICD-10-CM | POA: Diagnosis not present

## 2020-03-17 DIAGNOSIS — H3581 Retinal edema: Secondary | ICD-10-CM | POA: Diagnosis not present

## 2020-03-17 DIAGNOSIS — H2513 Age-related nuclear cataract, bilateral: Secondary | ICD-10-CM | POA: Diagnosis not present

## 2020-04-13 NOTE — Progress Notes (Deleted)
Office Visit Note  Patient: Gina Mitchell             Date of Birth: [birth year]           MRN: [id number]             PCP: Shirline Frees, MD Referring: Shirline Frees, MD Visit Date: 04/27/2020 Occupation: @GUAROCC @  Subjective:  No chief complaint on file.   History of Present Illness: Gina Mitchell is a 59 y.o. female ***   Activities of Daily Living:  Patient reports morning stiffness for *** {minute/hour:19697}.   Patient {ACTIONS;DENIES/REPORTS:21021675::"Denies"} nocturnal pain.  Difficulty dressing/grooming: {ACTIONS;DENIES/REPORTS:21021675::"Denies"} Difficulty climbing stairs: {ACTIONS;DENIES/REPORTS:21021675::"Denies"} Difficulty getting out of chair: {ACTIONS;DENIES/REPORTS:21021675::"Denies"} Difficulty using hands for taps, buttons, cutlery, and/or writing: {ACTIONS;DENIES/REPORTS:21021675::"Denies"}  No Rheumatology ROS completed.   PMFS History:  Patient Active Problem List   Diagnosis Date Noted  . Primary osteoarthritis of both hands 09/08/2017  . Primary osteoarthritis of both feet 09/08/2017  . Primary osteoarthritis of both knees 09/08/2017  . History of gastroesophageal reflux (GERD) 09/08/2017  . Dyslipidemia 09/08/2017  . Osteopenia of multiple sites 09/08/2017  . Right ankle swelling 06/26/2017  . Rheumatoid arthritis ([place]) 06/26/2017  . DDD (degenerative disc disease), cervical 09/13/2016  . Localized primary carpometacarpal osteoarthritis, right 08/26/2016  . Rotator cuff dysfunction, right 08/26/2016  . Metatarsalgia with a left third MTP synovitis 08/01/2016  . Metatarsal stress fracture of left foot 04/19/2016  . Abnormal EKG 11/06/2014  . Chest pain 06/10/2014  . Plantar fasciitis, bilateral 11/25/2013    Past Medical History:  Diagnosis Date  . Allergic rhinitis   . Esophageal reflux   . Incomplete right bundle branch block   . Rheumatoid arthritis ([place])   . Situational anxiety     Family History  Problem Relation Age of  Onset  . Prostate cancer Father   . Arrhythmia Father   . Breast cancer Sister   . Hypertension Mother    Past Surgical History:  Procedure Laterality Date  . BREAST EXCISIONAL BIOPSY    . Breast mass removal     Social History   Social History Narrative  . Not on file    There is no immunization history on file for this patient.   Objective: Vital Signs: There were no vitals taken for this visit.   Physical Exam   Musculoskeletal Exam: ***  CDAI Exam: CDAI Score: -- Patient Global: --; Provider Global: -- Swollen: --; Tender: -- Joint Exam 04/27/2020   No joint exam has been documented for this visit   There is currently no information documented on the homunculus. Go to the Rheumatology activity and complete the homunculus joint exam.  Investigation: No additional findings.  Imaging: No results found.  Recent Labs: Lab Results  Component Value Date   WBC 7.4 01/24/2020   HGB 14.3 01/24/2020   PLT 251 01/24/2020   NA 140 01/24/2020   K 4.0 01/24/2020   CL 103 01/24/2020   CO2 28 01/24/2020   GLUCOSE 88 01/24/2020   BUN 17 01/24/2020   CREATININE 0.80 01/24/2020   BILITOT 0.4 01/24/2020   ALKPHOS 79 06/26/2017   AST 23 01/24/2020   ALT 13 01/24/2020   PROT 7.0 01/24/2020   ALBUMIN 4.1 06/26/2017   CALCIUM 9.4 01/24/2020   GFRAA 94 01/24/2020   QFTBGOLD NEGATIVE 08/14/2017    Speciality Comments: No specialty comments available.  Procedures:  No procedures performed Allergies: Otho Darner allergy]   Assessment / Plan:     Visit  Diagnoses: No diagnosis found.  Orders: No orders of the defined types were placed in this encounter.  No orders of the defined types were placed in this encounter.   Face-to-face time spent with patient was *** minutes. Greater than 50% of time was spent in counseling and coordination of care.  Follow-Up Instructions: No follow-ups on file.   Earnestine Mealing, CMA  Note - This record has been created  using Editor, commissioning.  Chart creation errors have been sought, but may not always  have been located. Such creation errors do not reflect on  the standard of medical care.

## 2020-04-27 ENCOUNTER — Ambulatory Visit: Payer: 59 | Admitting: Physician Assistant

## 2020-04-30 ENCOUNTER — Ambulatory Visit: Payer: Self-pay

## 2020-04-30 ENCOUNTER — Other Ambulatory Visit: Payer: Self-pay

## 2020-04-30 ENCOUNTER — Encounter: Payer: Self-pay | Admitting: Rheumatology

## 2020-04-30 ENCOUNTER — Ambulatory Visit: Payer: 59 | Admitting: Rheumatology

## 2020-04-30 VITALS — BP 112/71 | HR 57 | Resp 14 | Ht 62.0 in | Wt 127.6 lb

## 2020-04-30 DIAGNOSIS — M19071 Primary osteoarthritis, right ankle and foot: Secondary | ICD-10-CM | POA: Diagnosis not present

## 2020-04-30 DIAGNOSIS — M5030 Other cervical disc degeneration, unspecified cervical region: Secondary | ICD-10-CM

## 2020-04-30 DIAGNOSIS — M19041 Primary osteoarthritis, right hand: Secondary | ICD-10-CM

## 2020-04-30 DIAGNOSIS — F5101 Primary insomnia: Secondary | ICD-10-CM

## 2020-04-30 DIAGNOSIS — M8589 Other specified disorders of bone density and structure, multiple sites: Secondary | ICD-10-CM

## 2020-04-30 DIAGNOSIS — M19072 Primary osteoarthritis, left ankle and foot: Secondary | ICD-10-CM | POA: Diagnosis not present

## 2020-04-30 DIAGNOSIS — M170 Bilateral primary osteoarthritis of knee: Secondary | ICD-10-CM

## 2020-04-30 DIAGNOSIS — M19042 Primary osteoarthritis, left hand: Secondary | ICD-10-CM

## 2020-04-30 DIAGNOSIS — H3581 Retinal edema: Secondary | ICD-10-CM

## 2020-04-30 DIAGNOSIS — R5383 Other fatigue: Secondary | ICD-10-CM

## 2020-04-30 DIAGNOSIS — Z79899 Other long term (current) drug therapy: Secondary | ICD-10-CM | POA: Diagnosis not present

## 2020-04-30 DIAGNOSIS — M0579 Rheumatoid arthritis with rheumatoid factor of multiple sites without organ or systems involvement: Secondary | ICD-10-CM | POA: Diagnosis not present

## 2020-04-30 DIAGNOSIS — M722 Plantar fascial fibromatosis: Secondary | ICD-10-CM

## 2020-04-30 DIAGNOSIS — Z8639 Personal history of other endocrine, nutritional and metabolic disease: Secondary | ICD-10-CM

## 2020-04-30 DIAGNOSIS — Z8719 Personal history of other diseases of the digestive system: Secondary | ICD-10-CM

## 2020-04-30 MED ORDER — METHOTREXATE SODIUM CHEMO INJECTION 50 MG/2ML: INTRAMUSCULAR | 0 refills | Status: DC

## 2020-04-30 MED FILL — METHOTREXATE 25 MG/ML VIAL: 50 | 87 days supply | Qty: 10 | Fill #0

## 2020-04-30 NOTE — Patient Instructions (Signed)
Standing Labs We placed an order today for your standing lab work.   Please have your standing labs drawn in October and every 3 months  If possible, please have your labs drawn 2 weeks prior to your appointment so that the provider can discuss your results at your appointment.  We have open lab daily Monday through Thursday from 8:30-12:30 PM and 1:30-4:30 PM and Friday from 8:30-12:30 PM and 1:30-4:00 PM at the office of Dr. Shaili Deveshwar, [facility].   Please be advised, patients with office appointments requiring lab work will take precedents over walk-in lab work.  If possible, please come for your lab work on Monday and Friday afternoons, as you may experience shorter wait times. The office is located at [address], [address], [place], [place] [address] No appointment is necessary.   Labs are drawn by [facility]. Please bring your co-pay at the time of your lab draw.  You may receive a bill from [facility] for your lab work.  If you wish to have your labs drawn at another location, please call the office 24 hours in advance to send orders.  If you have any questions regarding directions or hours of operation,  please call [phone].   As a reminder, please drink plenty of water prior to coming for your lab work. Thanks!  

## 2020-04-30 NOTE — Progress Notes (Signed)
Office Visit Note  Patient: Gina Mitchell             Date of Birth: 1961/06/26           MRN: [id number]             PCP: Shirline Frees, MD Referring: Shirline Frees, MD Visit Date: 04/30/2020 Occupation: @GUAROCC @  Subjective:  Medication monitoring   History of Present Illness: Gina Mitchell is a 59 y.o. female with history of seropositive rheumatoid arthritis.  She is on MTX 0.8 ml sq once weekly and folic acid 1 mg po daily.  She states that in the past she could not tolerate 2 mg of folic acid so she reduced to 1 mg daily.  She has been under a tremendous amount of stress since her husband has been hospitalized after having a stroke several weeks ago.  Patient reports that she has been having increased discomfort in both ankle joints but denies any joint swelling.  She is also noticed increased fatigue recently.  She would like to repeat rheumatoid arthritis lab work as well as check her B12 and folate level today.  She also needs a refill of methotrexate.  Activities of Daily Living:  Patient reports morning stiffness for 0  minutes.   Patient Denies nocturnal pain.  Difficulty dressing/grooming: Denies Difficulty climbing stairs: Denies Difficulty getting out of chair: Denies Difficulty using hands for taps, buttons, cutlery, and/or writing: Denies  Review of Systems  Constitutional: Positive for fatigue.  HENT: Negative for mouth sores, mouth dryness and nose dryness.   Eyes: Positive for dryness. Negative for pain and redness.  Respiratory: Negative for shortness of breath and difficulty breathing.   Cardiovascular: Negative for chest pain and palpitations.  Gastrointestinal: Positive for constipation. Negative for blood in stool and diarrhea.  Endocrine: Negative for increased urination.  Genitourinary: Negative for difficulty urinating and painful urination.  Musculoskeletal: Negative for arthralgias, joint pain, joint swelling, myalgias, morning stiffness, muscle  tenderness and myalgias.  Skin: Positive for hair loss. Negative for color change, rash and redness.  Allergic/Immunologic: Negative for susceptible to infections.  Neurological: Positive for dizziness and headaches. Negative for numbness, memory loss and weakness.  Hematological: Negative for bruising/bleeding tendency.  Psychiatric/Behavioral: Negative for confusion. The patient is nervous/anxious.     PMFS History:  Patient Active Problem List   Diagnosis Date Noted  . Primary osteoarthritis of both hands 09/08/2017  . Primary osteoarthritis of both feet 09/08/2017  . Primary osteoarthritis of both knees 09/08/2017  . History of gastroesophageal reflux (GERD) 09/08/2017  . Dyslipidemia 09/08/2017  . Osteopenia of multiple sites 09/08/2017  . Right ankle swelling 06/26/2017  . Rheumatoid arthritis ([place]) 06/26/2017  . DDD (degenerative disc disease), cervical 09/13/2016  . Localized primary carpometacarpal osteoarthritis, right 08/26/2016  . Rotator cuff dysfunction, right 08/26/2016  . Metatarsalgia with a left third MTP synovitis 08/01/2016  . Metatarsal stress fracture of left foot 04/19/2016  . Abnormal EKG 11/06/2014  . Chest pain 06/10/2014  . Plantar fasciitis, bilateral 11/25/2013    Past Medical History:  Diagnosis Date  . Allergic rhinitis   . Esophageal reflux   . Incomplete right bundle branch block   . Rheumatoid arthritis ([place])   . Situational anxiety     Family History  Problem Relation Age of Onset  . Prostate cancer Father   . Arrhythmia Father   . Breast cancer Sister   . Hypertension Mother    Past Surgical History:  Procedure Laterality  Date  . BREAST EXCISIONAL BIOPSY    . Breast mass removal     Social History   Social History Narrative  . Not on file    There is no immunization history on file for this patient.   Objective: Vital Signs: BP 112/71 (BP Location: Left Arm, Patient Position: Sitting, Cuff Size: Normal)   Pulse (!) 57    Resp 14   Ht 5\' 2"  (1.575 m)   Wt 127 lb 9.6 oz (57.9 kg)   BMI 23.34 kg/m    Physical Exam Vitals and nursing note reviewed.  Constitutional:      Appearance: She is well-developed.  HENT:     Head: Normocephalic and atraumatic.  Eyes:     Conjunctiva/sclera: Conjunctivae normal.  Pulmonary:     Effort: Pulmonary effort is normal.  Abdominal:     General: Bowel sounds are normal.     Palpations: Abdomen is soft.  Musculoskeletal:     Cervical back: Normal range of motion.  Lymphadenopathy:     Cervical: No cervical adenopathy.  Skin:    General: Skin is warm and dry.     Capillary Refill: Capillary refill takes less than 2 seconds.  Neurological:     Mental Status: She is alert and oriented to person, place, and time.  Psychiatric:        Behavior: Behavior normal.      Musculoskeletal Exam: C-spine, thoracic spine, lumbar spine good range of motion.  Shoulder joints, elbow joints, wrist joints, MCPs, PIPs and DIPs good range of motion with no synovitis.  She has complete fist formation bilaterally.  Hip joints have good range of motion with no discomfort.  Knee joints have good range of motion with no warmth or effusion.  She has tenderness of both ankle joints on exam today.  No tenderness of MTP joints.  CDAI Exam: CDAI Score: 0.8  Patient Global: 4 mm; Provider Global: 4 mm Swollen: 0 ; Tender: 0  Joint Exam 04/30/2020   No joint exam has been documented for this visit   There is currently no information documented on the homunculus. Go to the Rheumatology activity and complete the homunculus joint exam.  Investigation: No additional findings.  Imaging: XR Foot 2 Views Left  Result Date: 04/30/2020 PIP and DIP narrowing was noted.  No MTP joint narrowing or intertarsal or tibiotalar joint space narrowing was noted.  Inferior calcaneal spur was noted.  No erosive changes were noted.  No radiographic progression was noted when compared to the x-rays of 2018.  Impression: These findings are consistent with osteoarthritis of the foot.  XR Foot 2 Views Right  Result Date: 04/30/2020 PIP and DIP narrowing was noted.  No MTP joint narrowing or intertarsal or tibiotalar joint space narrowing was noted.  Inferior calcaneal spur was noted.  No erosive changes were noted.  No radiographic progression was noted when compared to the x-rays of 2018. Impression: These findings are consistent with osteoarthritis of the foot.  XR Hand 2 View Left  Result Date: 04/30/2020 Juxta-articular osteopenia was noted.  PIP and DIP narrowing was noted.  No intercarpal radiocarpal joint space narrowing was noted.  No erosive changes were noted.  No radiographic progression was noted when compared to the films of 2018. Impression: These findings are consistent with rheumatoid arthritis and osteoarthritis overlap.  XR Hand 2 View Right  Result Date: 04/30/2020 Juxta-articular osteopenia was noted.  Mild second and third MCP joint narrowing was noted.  Intercarpal and radiocarpal  joint space narrowing was noted.  PIP and DIP narrowing was noted.  Some cystic changes were noted in the carpal bones but no erosions were noted.  No radiographic progression was noted when compared to the films of 2018. Impression: These findings are consistent with rheumatoid arthritis and osteoarthritis overlap.   Recent Labs: Lab Results  Component Value Date   WBC 7.4 01/24/2020   HGB 14.3 01/24/2020   PLT 251 01/24/2020   NA 140 01/24/2020   K 4.0 01/24/2020   CL 103 01/24/2020   CO2 28 01/24/2020   GLUCOSE 88 01/24/2020   BUN 17 01/24/2020   CREATININE 0.80 01/24/2020   BILITOT 0.4 01/24/2020   ALKPHOS 79 06/26/2017   AST 23 01/24/2020   ALT 13 01/24/2020   PROT 7.0 01/24/2020   ALBUMIN 4.1 06/26/2017   CALCIUM 9.4 01/24/2020   GFRAA 94 01/24/2020   QFTBGOLD NEGATIVE 08/14/2017    Speciality Comments: No specialty comments available.  Procedures:  No procedures  performed Allergies: Crab [shellfish allergy]   Assessment / Plan:     Visit Diagnoses: Rheumatoid arthritis involving multiple sites with positive rheumatoid factor ([place]) -She has no synovitis on exam.  She has tenderness of both ankle joints upon palpation.  She has been having increased arthralgias and fatigue recently which she attributes to being under increased stress since her husband has been hospitalized.  No warmth or effusion of ankle joints were noted on exam today.  X-rays of both hands and feet were obtained today to assess for radiographic progression.  She has been injecting methotrexate 0.8 mL subcutaneously once weekly and taking folic acid 1 mg by mouth daily.  We discussed increasing the dose of folic acid to 2 mg daily but she states she cannot tolerate the higher dose in the past.  She would like to check her folate level today.  She is also due to update CBC and CMP so orders were released.  She will continue on methotrexate 0.8 mL subcu injections once weekly.  She was advised to notify us if she develops increased joint pain or joint swelling.  She will follow-up in the office in 5 months.  Plan: XR Hand 2 View Right, XR Hand 2 View Left, XR Foot 2 Views Right, XR Foot 2 Views Left, Cyclic citrul peptide antibody, IgG, Rheumatoid factor, Sedimentation rate  High risk medication use - Methotrexate 0.8 ml every 7 days and folic acid 1 mg 1 tablet daily.  CBC and CMP were within normal limits on 01/24/2020.  She is due to update lab work today.  Orders for CBC and CMP were released.  She will follow-up for lab work in October and every 3 months to monitor for drug toxicity.- Plan: CBC with Differential/Platelet, COMPLETE METABOLIC PANEL WITH GFR  Primary osteoarthritis of both hands: She is not experiencing any discomfort in her hands at this time.  She has complete fist formation bilaterally.  No inflammation was noted.  Joint protection and muscle strengthening were  discussed.  Primary osteoarthritis of both knees: She has good range of motion of both knee joints on exam.  No warmth or effusion was noted.  She has occasional discomfort in her left knee joint when cycling.  Primary osteoarthritis of both feet: She has tenderness to palpation over both ankle joints.  No warmth or effusion was noted on exam today.  She is able to bear weight fully.  She has no tenderness of MTP joints on exam.  We discussed the  importance of wearing proper fitting shoes.  DDD (degenerative disc disease), cervical: She has good range of motion of the C-spine on exam.  No symptoms of radiculopathy.  Other fatigue -She has been experiencing increased fatigue over the past several weeks.  She experiences increased fatigue about 24- 36 hours after each methotrexate dose.  She requested to have vitamin B12 and folate level checked today.  She was encouraged to take folic acid 2 mg by mouth daily but she states that she can only tolerate 1 mg by mouth daily.  Plan: B12 and Folate Panel  Primary insomnia: She has not been sleeping as well recently due to the increased stress she has been experiencing.    Osteopenia of multiple sites - She is taking a vitamin D supplement.  Other medical conditions are listed as follows  History of hyperlipidemia  History of gastroesophageal reflux (GERD)  Plantar fasciitis, bilateral  Retinal edema  Orders: Orders Placed This Encounter  Procedures  . XR Hand 2 View Right  . XR Hand 2 View Left  . XR Foot 2 Views Right  . XR Foot 2 Views Left  . CBC with Differential/Platelet  . COMPLETE METABOLIC PANEL WITH GFR  . Cyclic citrul peptide antibody, IgG  . Rheumatoid factor  . Sedimentation rate  . B12 and Folate Panel   Meds ordered this encounter  Medications  . methotrexate 50 MG/2ML injection    Sig: INJECT 0.8MLS INTO THE SKIN ONCE A WEEK    Dispense:  10 mL    Refill:  0    Face-to-face time spent with patient was 30  minutes. Greater than 50% of time was spent in counseling and coordination of care.  Follow-Up Instructions: Return in about 5 months (around 09/30/2020) for Rheumatoid arthritis, Osteoarthritis.   Hazel Sams, PA-C  I examined and evaluated the patient with Hazel Sams PA.  Patient had no synovitis on my examination.  She had no radiographic progression on the x-rays.  She is going through a difficult time as her husband recently had a stroke and is gradually recovering from it.  We will continue current treatment for now.  The plan of care was discussed as noted above.  Bo Merino, MD  Note - This record has been created using Editor, commissioning.  Chart creation errors have been sought, but may not always  have been located. Such creation errors do not reflect on  the standard of medical care.

## 2020-05-01 ENCOUNTER — Encounter: Payer: Self-pay | Admitting: Rheumatology

## 2020-05-01 LAB — COMPLETE METABOLIC PANEL WITH GFR
AG Ratio: 2 (calc) (ref 1.0–2.5)
ALT: 14 U/L (ref 6–29)
AST: 24 U/L (ref 10–35)
Albumin: 4.3 g/dL (ref 3.6–5.1)
Alkaline phosphatase (APISO): 62 U/L (ref 37–153)
BUN: 16 mg/dL (ref 7–25)
CO2: 32 mmol/L (ref 20–32)
Calcium: 9.3 mg/dL (ref 8.6–10.4)
Chloride: 104 mmol/L (ref 98–110)
Creat: 0.77 mg/dL (ref 0.50–1.05)
GFR, Est African American: 99 mL/min/{1.73_m2} (ref >=60)
GFR, Est Non African American: 85 mL/min/{1.73_m2} (ref >=60)
Globulin: 2.2 g/dL (calc) (ref 1.9–3.7)
Glucose, Bld: 83 mg/dL (ref 65–99)
Potassium: 4.5 mmol/L (ref 3.5–5.3)
Sodium: 140 mmol/L (ref 135–146)
Total Bilirubin: 0.7 mg/dL (ref 0.2–1.2)
Total Protein: 6.5 g/dL (ref 6.1–8.1)

## 2020-05-01 LAB — CBC WITH DIFFERENTIAL/PLATELET
Absolute Monocytes: 294 cells/uL (ref 200–950)
Basophils Absolute: 49 cells/uL (ref 0–200)
Basophils Relative: 1 %
Eosinophils Absolute: 181 cells/uL (ref 15–500)
Eosinophils Relative: 3.7 %
HCT: 39.1 % (ref 35.0–45.0)
Hemoglobin: 13.5 g/dL (ref 11.7–15.5)
Lymphs Abs: 1740 cells/uL (ref 850–3900)
MCH: 32.6 pg (ref 27.0–33.0)
MCHC: 34.5 g/dL (ref 32.0–36.0)
MCV: 94.4 fL (ref 80.0–100.0)
MPV: 9.9 fL (ref 7.5–12.5)
Monocytes Relative: 6 %
Neutro Abs: 2636 cells/uL (ref 1500–7800)
Neutrophils Relative %: 53.8 %
Platelets: 247 10*3/uL (ref 140–400)
RBC: 4.14 10*6/uL (ref 3.80–5.10)
RDW: 13.3 % (ref 11.0–15.0)
Total Lymphocyte: 35.5 %
WBC: 4.9 10*3/uL (ref 3.8–10.8)

## 2020-05-01 LAB — CYCLIC CITRUL PEPTIDE ANTIBODY, IGG: Cyclic Citrullin Peptide Ab: 84 UNITS — ABNORMAL HIGH

## 2020-05-01 LAB — RHEUMATOID FACTOR: Rheumatoid fact SerPl-aCnc: 17 IU/mL — ABNORMAL HIGH (ref <14)

## 2020-05-01 LAB — B12 AND FOLATE PANEL
Folate: >24 ng/mL
Vitamin B-12: 391 pg/mL (ref 200–1100)

## 2020-05-01 LAB — SEDIMENTATION RATE: Sed Rate: 2 mm/h (ref 0–30)

## 2020-05-01 NOTE — Telephone Encounter (Signed)
Discussed with Dr. Estanislado Pandy.   She would like the patient to be aware that red yeast rice can cause an elevation in LFTs and she should talk about it further with PCP.   Tdap is a recommended booster vaccine every 10 years.  We recommend all necessary vaccinations that are not live vaccines.

## 2020-05-01 NOTE — Progress Notes (Signed)
CBC and CMP WNL.  ESR WNL.  RF and anti-CCP remain positive but stable.  Folate and vitamin B12 WNL.  ESR WNL.

## 2020-06-23 DIAGNOSIS — Z1239 Encounter for other screening for malignant neoplasm of breast: Secondary | ICD-10-CM | POA: Diagnosis not present

## 2020-06-23 DIAGNOSIS — M8580 Other specified disorders of bone density and structure, unspecified site: Secondary | ICD-10-CM | POA: Diagnosis not present

## 2020-06-23 DIAGNOSIS — Z1211 Encounter for screening for malignant neoplasm of colon: Secondary | ICD-10-CM | POA: Diagnosis not present

## 2020-06-23 DIAGNOSIS — Z6824 Body mass index (BMI) 24.0-24.9, adult: Secondary | ICD-10-CM | POA: Diagnosis not present

## 2020-06-23 DIAGNOSIS — Z1231 Encounter for screening mammogram for malignant neoplasm of breast: Secondary | ICD-10-CM | POA: Diagnosis not present

## 2020-06-23 DIAGNOSIS — Z01419 Encounter for gynecological examination (general) (routine) without abnormal findings: Secondary | ICD-10-CM | POA: Diagnosis not present

## 2020-07-02 MED FILL — ALPRAZolam 0.25 MG TABS: 0.25 | 15 days supply | Qty: 30 | Fill #0

## 2020-07-10 ENCOUNTER — Other Ambulatory Visit: Payer: Self-pay | Admitting: Physician Assistant

## 2020-07-10 DIAGNOSIS — M0579 Rheumatoid arthritis with rheumatoid factor of multiple sites without organ or systems involvement: Secondary | ICD-10-CM

## 2020-07-10 MED FILL — METHOTREXATE 25 MG/ML VIAL: 50 | 87 days supply | Qty: 10 | Fill #0

## 2020-07-10 NOTE — Telephone Encounter (Signed)
Last Visit: 04/30/2020 Next Visit: 10/05/2020 Labs: 04/30/2020 CBC and CMP WNL.  Current Dose per office note 04/30/2020: Methotrexate 0.8 ml every 7 days  Okay to refill per Dr. Estanislado Pandy

## 2020-08-04 ENCOUNTER — Other Ambulatory Visit: Payer: Self-pay | Admitting: Family Medicine

## 2020-08-04 DIAGNOSIS — R109 Unspecified abdominal pain: Secondary | ICD-10-CM

## 2020-08-07 ENCOUNTER — Other Ambulatory Visit (HOSPITAL_COMMUNITY): Payer: Self-pay | Admitting: Family Medicine

## 2020-08-07 ENCOUNTER — Other Ambulatory Visit: Payer: Self-pay | Admitting: Family Medicine

## 2020-08-07 DIAGNOSIS — R109 Unspecified abdominal pain: Secondary | ICD-10-CM

## 2020-08-10 ENCOUNTER — Other Ambulatory Visit: Payer: Self-pay

## 2020-08-10 ENCOUNTER — Ambulatory Visit (HOSPITAL_COMMUNITY)
Admission: RE | Admit: 2020-08-10 | Discharge: 2020-08-10 | Disposition: A | Discharge status: Home / Self Care | Payer: 59 | Source: Ambulatory Visit | Attending: Family Medicine | Admitting: Family Medicine

## 2020-08-10 DIAGNOSIS — R109 Unspecified abdominal pain: Secondary | ICD-10-CM | POA: Diagnosis not present

## 2020-08-24 ENCOUNTER — Other Ambulatory Visit: Payer: Self-pay | Admitting: *Deleted

## 2020-08-24 DIAGNOSIS — Z79899 Other long term (current) drug therapy: Secondary | ICD-10-CM

## 2020-08-25 LAB — COMPLETE METABOLIC PANEL WITH GFR
AG Ratio: 1.8 (calc) (ref 1.0–2.5)
ALT: 10 U/L (ref 6–29)
AST: 20 U/L (ref 10–35)
Albumin: 4.2 g/dL (ref 3.6–5.1)
Alkaline phosphatase (APISO): 67 U/L (ref 37–153)
BUN: 18 mg/dL (ref 7–25)
CO2: 27 mmol/L (ref 20–32)
Calcium: 9.6 mg/dL (ref 8.6–10.4)
Chloride: 104 mmol/L (ref 98–110)
Creat: 0.77 mg/dL (ref 0.50–1.05)
GFR, Est African American: 98 mL/min/{1.73_m2} (ref >=60)
GFR, Est Non African American: 85 mL/min/{1.73_m2} (ref >=60)
Globulin: 2.4 g/dL (calc) (ref 1.9–3.7)
Glucose, Bld: 78 mg/dL (ref 65–99)
Potassium: 4.4 mmol/L (ref 3.5–5.3)
Sodium: 138 mmol/L (ref 135–146)
Total Bilirubin: 0.5 mg/dL (ref 0.2–1.2)
Total Protein: 6.6 g/dL (ref 6.1–8.1)

## 2020-08-25 LAB — CBC WITH DIFFERENTIAL/PLATELET
Absolute Monocytes: 291 cells/uL (ref 200–950)
Basophils Absolute: 51 cells/uL (ref 0–200)
Basophils Relative: 0.9 %
Eosinophils Absolute: 160 cells/uL (ref 15–500)
Eosinophils Relative: 2.8 %
HCT: 40.2 % (ref 35.0–45.0)
Hemoglobin: 13.5 g/dL (ref 11.7–15.5)
Lymphs Abs: 1927 cells/uL (ref 850–3900)
MCH: 31.4 pg (ref 27.0–33.0)
MCHC: 33.6 g/dL (ref 32.0–36.0)
MCV: 93.5 fL (ref 80.0–100.0)
MPV: 10.2 fL (ref 7.5–12.5)
Monocytes Relative: 5.1 %
Neutro Abs: 3272 cells/uL (ref 1500–7800)
Neutrophils Relative %: 57.4 %
Platelets: 229 10*3/uL (ref 140–400)
RBC: 4.3 10*6/uL (ref 3.80–5.10)
RDW: 13.1 % (ref 11.0–15.0)
Total Lymphocyte: 33.8 %
WBC: 5.7 10*3/uL (ref 3.8–10.8)

## 2020-08-25 NOTE — Progress Notes (Signed)
CBC and CMP are normal.

## 2020-09-21 NOTE — Progress Notes (Signed)
Office Visit Note  Patient: Gina Mitchell             Date of Birth: 09-22-1961           MRN: [id number]             PCP: Shirline Frees, MD Referring: Shirline Frees, MD Visit Date: 10/05/2020 Occupation: @GUAROCC @  Subjective:  Other (bilateral knee pain )   History of Present Illness: Gina Mitchell is a 59 y.o. female with history of seropositive rheumatoid arthritis and osteoarthritis.  She has been doing well on methotrexate 0.8 mL subcu weekly.  She denies any increased joint swelling.  She states recently she has been having increased pain in her both knees.  She has not noticed any swelling.  She is not having any increased discomfort in her cervical spine.  Activities of Daily Living:  Patient reports morning stiffness for a few minutes.   Patient Denies nocturnal pain.  Difficulty dressing/grooming: Denies Difficulty climbing stairs: Denies Difficulty getting out of chair: Denies Difficulty using hands for taps, buttons, cutlery, and/or writing: Denies  Review of Systems  Constitutional: Positive for fatigue.  HENT: Positive for mouth sores. Negative for mouth dryness and nose dryness.   Eyes: Positive for dryness. Negative for pain and itching.  Respiratory: Negative for shortness of breath and difficulty breathing.   Cardiovascular: Negative for chest pain and palpitations.  Gastrointestinal: Positive for constipation. Negative for blood in stool and diarrhea.  Endocrine: Negative for increased urination.  Genitourinary: Negative for difficulty urinating.  Musculoskeletal: Positive for arthralgias, joint pain, joint swelling and morning stiffness. Negative for myalgias, muscle tenderness and myalgias.  Skin: Negative for color change, rash and redness.  Allergic/Immunologic: Negative for susceptible to infections.  Neurological: Positive for headaches. Negative for dizziness, numbness, memory loss and weakness.  Hematological: Positive for bruising/bleeding  tendency.  Psychiatric/Behavioral: Negative for confusion.    PMFS History:  Patient Active Problem List   Diagnosis Date Noted  . Primary osteoarthritis of both hands 09/08/2017  . Primary osteoarthritis of both feet 09/08/2017  . Primary osteoarthritis of both knees 09/08/2017  . History of gastroesophageal reflux (GERD) 09/08/2017  . Dyslipidemia 09/08/2017  . Osteopenia of multiple sites 09/08/2017  . Right ankle swelling 06/26/2017  . Rheumatoid arthritis ([place]) 06/26/2017  . DDD (degenerative disc disease), cervical 09/13/2016  . Localized primary carpometacarpal osteoarthritis, right 08/26/2016  . Rotator cuff dysfunction, right 08/26/2016  . Metatarsalgia with a left third MTP synovitis 08/01/2016  . Metatarsal stress fracture of left foot 04/19/2016  . Abnormal EKG 11/06/2014  . Chest pain 06/10/2014  . Plantar fasciitis, bilateral 11/25/2013    Past Medical History:  Diagnosis Date  . Allergic rhinitis   . Esophageal reflux   . Incomplete right bundle branch block   . Rheumatoid arthritis ([place])   . Situational anxiety     Family History  Problem Relation Age of Onset  . Prostate cancer Father   . Arrhythmia Father   . Breast cancer Sister   . Hypertension Mother    Past Surgical History:  Procedure Laterality Date  . BREAST EXCISIONAL BIOPSY    . Breast mass removal     Social History   Social History Narrative  . Not on file   Immunization History  Administered Date(s) Administered  . PFIZER SARS-COV-2 Vaccination 11/25/2019, 12/16/2019     Objective: Vital Signs: BP 111/78 (BP Location: Left Arm, Patient Position: Sitting, Cuff Size: Normal)   Pulse 80  Resp 13   Ht 5\' 2"  (1.575 m)   Wt 123 lb 9.6 oz (56.1 kg)   BMI 22.61 kg/m    Physical Exam Vitals and nursing note reviewed.  Constitutional:      Appearance: She is well-developed.  HENT:     Head: Normocephalic and atraumatic.  Eyes:     Conjunctiva/sclera: Conjunctivae normal.    Cardiovascular:     Rate and Rhythm: Normal rate and regular rhythm.     Heart sounds: Normal heart sounds.  Pulmonary:     Effort: Pulmonary effort is normal.     Breath sounds: Normal breath sounds.  Abdominal:     General: Bowel sounds are normal.     Palpations: Abdomen is soft.  Musculoskeletal:     Cervical back: Normal range of motion.  Lymphadenopathy:     Cervical: No cervical adenopathy.  Skin:    General: Skin is warm and dry.     Capillary Refill: Capillary refill takes less than 2 seconds.  Neurological:     Mental Status: She is alert and oriented to person, place, and time.  Psychiatric:        Behavior: Behavior normal.      Musculoskeletal Exam: C-spine, thoracic and lumbar spine were in good range of motion.  Shoulder joints, elbow joints, wrist joints, MCPs and PIPs with good range of motion with no synovitis.  Hip joints, knee joints, ankles, MTPs and PIPs with good range of motion with no synovitis.  CDAI Exam: CDAI Score: 0.4  Patient Global: 3 mm; Provider Global: 1 mm Swollen: 0 ; Tender: 0  Joint Exam 10/05/2020   No joint exam has been documented for this visit   There is currently no information documented on the homunculus. Go to the Rheumatology activity and complete the homunculus joint exam.  Investigation: No additional findings.  Imaging: No results found.  Recent Labs: Lab Results  Component Value Date   WBC 5.7 08/24/2020   HGB 13.5 08/24/2020   PLT 229 08/24/2020   NA 138 08/24/2020   K 4.4 08/24/2020   CL 104 08/24/2020   CO2 27 08/24/2020   GLUCOSE 78 08/24/2020   BUN 18 08/24/2020   CREATININE 0.77 08/24/2020   BILITOT 0.5 08/24/2020   ALKPHOS 79 06/26/2017   AST 20 08/24/2020   ALT 10 08/24/2020   PROT 6.6 08/24/2020   ALBUMIN 4.1 06/26/2017   CALCIUM 9.6 08/24/2020   GFRAA 98 08/24/2020   QFTBGOLD NEGATIVE 08/14/2017    Speciality Comments: No specialty comments available.  Procedures:  No procedures  performed Allergies: Crab [shellfish allergy]   Assessment / Plan:     Visit Diagnoses: Rheumatoid arthritis involving multiple sites with positive rheumatoid factor ([facility])-she is clinically doing well on methotrexate.  No synovitis was noted on my examination today.  She wants to taper her methotrexate.  Have advised her to decrease methotrexate to 0.7 mL subcu weekly.  If she tolerates it well then may be after 3 months we can try tapering again.  She had a flare last time she tapered methotrexate.  High risk medication use - Methotrexate 0.8 ml every 7 days and folic acid 1 mg 1 tablet daily.  Her labs are stable.  We will monitor her labs every 3 months.  Primary osteoarthritis of both hands-she has bilateral CMC PIP and DIP thickening consistent with osteoarthritis.  Joint protection muscle strengthening was discussed.  Chronic pain of both knees -she complains of increased pain and discomfort in  her knee joints.  No warmth swelling or effusion was noted.  Plan: XR KNEE 3 VIEW RIGHT, XR KNEE 3 VIEW LEFT.  X-ray showed mild osteoarthritis and mild chondromalacia patella.  No interval changes noted when compared to the x-rays of 2018.  I have given her a handout on knee joint exercises.  Primary osteoarthritis of both knees-she has underlying osteoarthritis.  Knee joint exercises were discussed and handout was given.  Primary osteoarthritis of both feet-she currently does not have any discomfort in her feet.  DDD (degenerative disc disease), cervical-doing better.  Other fatigue-she continues to have fatigue which she relates to methotrexate.  She also has history of insomnia.  Primary insomnia-good sleep hygiene was discussed.  Osteopenia of multiple sites-I do not have a DEXA scan available.  The medical problems listed as follows:  History of hyperlipidemia  History of gastroesophageal reflux (GERD)  Retinal edema  Dyslipidemia  Orders: Orders Placed This Encounter    Procedures  . XR KNEE 3 VIEW RIGHT  . XR KNEE 3 VIEW LEFT   No orders of the defined types were placed in this encounter.    Follow-Up Instructions: Return for Rheumatoid arthritis, Osteoarthritis.   Bo Merino, MD  Note - This record has been created using Editor, commissioning.  Chart creation errors have been sought, but may not always  have been located. Such creation errors do not reflect on  the standard of medical care.,

## 2020-09-29 ENCOUNTER — Other Ambulatory Visit: Payer: Self-pay | Admitting: Rheumatology

## 2020-09-29 DIAGNOSIS — M0579 Rheumatoid arthritis with rheumatoid factor of multiple sites without organ or systems involvement: Secondary | ICD-10-CM

## 2020-09-29 MED FILL — FOLIC ACID 1 MG TABS: 1 | 90 days supply | Qty: 180 | Fill #0

## 2020-09-29 MED FILL — METHOTREXATE 25 MG/ML VIAL: 50 | 87 days supply | Qty: 10 | Fill #0

## 2020-09-29 NOTE — Telephone Encounter (Signed)
Last Visit: 04/30/2020 Next Visit: 10/05/2020 Labs: 08/24/2020 CBC and CMP are normal.  Current Dose per office note 04/30/2020: Methotrexate 0.8 ml every 7 days  Okay to refill per Dr. Estanislado Pandy

## 2020-10-05 ENCOUNTER — Ambulatory Visit: Payer: 59 | Admitting: Rheumatology

## 2020-10-05 ENCOUNTER — Ambulatory Visit: Payer: Self-pay

## 2020-10-05 ENCOUNTER — Encounter: Payer: Self-pay | Admitting: Rheumatology

## 2020-10-05 ENCOUNTER — Other Ambulatory Visit: Payer: Self-pay

## 2020-10-05 VITALS — BP 111/78 | HR 80 | Resp 13 | Ht 62.0 in | Wt 123.6 lb

## 2020-10-05 DIAGNOSIS — Z8639 Personal history of other endocrine, nutritional and metabolic disease: Secondary | ICD-10-CM

## 2020-10-05 DIAGNOSIS — M5030 Other cervical disc degeneration, unspecified cervical region: Secondary | ICD-10-CM

## 2020-10-05 DIAGNOSIS — G8929 Other chronic pain: Secondary | ICD-10-CM

## 2020-10-05 DIAGNOSIS — M19071 Primary osteoarthritis, right ankle and foot: Secondary | ICD-10-CM | POA: Diagnosis not present

## 2020-10-05 DIAGNOSIS — M19041 Primary osteoarthritis, right hand: Secondary | ICD-10-CM | POA: Diagnosis not present

## 2020-10-05 DIAGNOSIS — Z79899 Other long term (current) drug therapy: Secondary | ICD-10-CM

## 2020-10-05 DIAGNOSIS — M8589 Other specified disorders of bone density and structure, multiple sites: Secondary | ICD-10-CM

## 2020-10-05 DIAGNOSIS — M19072 Primary osteoarthritis, left ankle and foot: Secondary | ICD-10-CM

## 2020-10-05 DIAGNOSIS — M19042 Primary osteoarthritis, left hand: Secondary | ICD-10-CM

## 2020-10-05 DIAGNOSIS — R5383 Other fatigue: Secondary | ICD-10-CM

## 2020-10-05 DIAGNOSIS — M722 Plantar fascial fibromatosis: Secondary | ICD-10-CM

## 2020-10-05 DIAGNOSIS — E785 Hyperlipidemia, unspecified: Secondary | ICD-10-CM

## 2020-10-05 DIAGNOSIS — H3581 Retinal edema: Secondary | ICD-10-CM

## 2020-10-05 DIAGNOSIS — Z8719 Personal history of other diseases of the digestive system: Secondary | ICD-10-CM

## 2020-10-05 DIAGNOSIS — M0579 Rheumatoid arthritis with rheumatoid factor of multiple sites without organ or systems involvement: Secondary | ICD-10-CM | POA: Diagnosis not present

## 2020-10-05 DIAGNOSIS — M25562 Pain in left knee: Secondary | ICD-10-CM | POA: Diagnosis not present

## 2020-10-05 DIAGNOSIS — F5101 Primary insomnia: Secondary | ICD-10-CM | POA: Diagnosis not present

## 2020-10-05 DIAGNOSIS — M170 Bilateral primary osteoarthritis of knee: Secondary | ICD-10-CM

## 2020-10-05 DIAGNOSIS — M25561 Pain in right knee: Secondary | ICD-10-CM | POA: Diagnosis not present

## 2020-10-05 NOTE — Patient Instructions (Addendum)
Standing Labs We placed an order today for your standing lab work.   Please have your standing labs drawn in January and every 3 months  If possible, please have your labs drawn 2 weeks prior to your appointment so that the provider can discuss your results at your appointment.  We have open lab daily Monday through Thursday from 8:30-12:30 PM and 1:30-4:30 PM and Friday from 8:30-12:30 PM and 1:30-4:00 PM at the office of Dr. Bo Merino, [facility].   Please be advised, patients with office appointments requiring lab work will take precedents over walk-in lab work.  If possible, please come for your lab work on Monday and Friday afternoons, as you may experience shorter wait times. The office is located at [address], [place], [place], [place] [address] No appointment is necessary.   Labs are drawn by [facility]. Please bring your co-pay at the time of your lab draw.  You may receive a bill from [facility] for your lab work.  If you wish to have your labs drawn at another location, please call the office 24 hours in advance to send orders.  If you have any questions regarding directions or hours of operation,  please call [phone].   As a reminder, please drink plenty of water prior to coming for your lab work. Thanks!  COVID-19 vaccine recommendations:   COVID-19 vaccine is recommended for everyone (unless you are allergic to a vaccine component), even if you are on a medication that suppresses your immune system.   If you are on Methotrexate, Cellcept (mycophenolate), Rinvoq, Morrie Sheldon, and Olumiant- hold the medication for 1 week after each vaccine. Hold Methotrexate for 2 weeks after the single dose COVID-19 vaccine.   Do not take Tylenol or any anti-inflammatory medications (NSAIDs) 24 hours prior to the COVID-19 vaccination.   There is no direct evidence about the efficacy of the COVID-19 vaccine in individuals who are on medications that suppress the immune  system.   Even if you are fully vaccinated, and you are on any medications that suppress your immune system, please continue to wear a mask, maintain at least six feet social distance and practice hand hygiene.   If you develop a COVID-19 infection, please contact your PCP or our office to determine if you need monoclonal antibody infusion.  The booster vaccine is now available for immunocompromised patients.   Please see the following web sites for updated information.   [email or web address]-Patient-Resources.[email or web address]  Vaccines You are taking a medication(s) that can suppress your immune system.  The following immunizations are recommended: . Flu annually . Covid-19  . Pneumonia (Pneumovax 23 and Prevnar 13 spaced at least 1 year apart) . Shingrix (after age 59)  Please check with your PCP to make sure you are up to date.  Heart Disease Prevention   Your inflammatory disease increases your risk of heart disease which includes heart attack, stroke, atrial fibrillation (irregular heartbeats), high blood pressure, heart failure and atherosclerosis (plaque in the arteries).  It is important to reduce your risk by:   . Keep blood pressure, cholesterol, and blood sugar at healthy levels   . Smoking Cessation   . Maintain a healthy weight  o BMI 20-25   . Eat a healthy diet  o Plenty of fresh fruit, vegetables, and whole grains  o Limit saturated fats, foods high in sodium, and added sugars  o DASH and Mediterranean diet   . Increase physical activity  o Recommend moderate physically activity for 150 minutes  per week/ 30 minutes a day for five days a week These can be broken up into three separate ten-minute sessions during the day.   . Reduce Stress  . Meditation, slow breathing exercises, yoga, coloring books  . Dental visits twice a year      Journal for Nurse Practitioners, 15(4), [phone]. Retrieved August 06, 2018 from  http://clinicalkey.com/nursing">    Knee Exercises Ask your health care provider which exercises are safe for you. Do exercises exactly as told by your health care provider and adjust them as directed. It is normal to feel mild stretching, pulling, tightness, or discomfort as you do these exercises. Stop right away if you feel sudden pain or your pain gets worse. Do not begin these exercises until told by your health care provider. Stretching and range-of-motion exercises These exercises warm up your muscles and joints and improve the movement and flexibility of your knee. These exercises also help to relieve pain and swelling. Knee extension, prone 1. Lie on your abdomen (prone position) on a bed. 2. Place your left / right knee just beyond the edge of the surface so your knee is not on the bed. You can put a towel under your left / right thigh just above your kneecap for comfort. 3. Relax your leg muscles and allow gravity to straighten your knee (extension). You should feel a stretch behind your left / right knee. 4. Hold this position for __________ seconds. 5. Scoot up so your knee is supported between repetitions. Repeat __________ times. Complete this exercise __________ times a day. Knee flexion, active  1. Lie on your back with both legs straight. If this causes back discomfort, bend your left / right knee so your foot is flat on the floor. 2. Slowly slide your left / right heel back toward your buttocks. Stop when you feel a gentle stretch in the front of your knee or thigh (flexion). 3. Hold this position for __________ seconds. 4. Slowly slide your left / right heel back to the starting position. Repeat __________ times. Complete this exercise __________ times a day. Quadriceps stretch, prone  1. Lie on your abdomen on a firm surface, such as a bed or padded floor. 2. Bend your left / right knee and hold your ankle. If you cannot reach your ankle or pant leg, loop a belt around  your foot and grab the belt instead. 3. Gently pull your heel toward your buttocks. Your knee should not slide out to the side. You should feel a stretch in the front of your thigh and knee (quadriceps). 4. Hold this position for __________ seconds. Repeat __________ times. Complete this exercise __________ times a day. Hamstring, supine 1. Lie on your back (supine position). 2. Loop a belt or towel over the ball of your left / right foot. The ball of your foot is on the walking surface, right under your toes. 3. Straighten your left / right knee and slowly pull on the belt to raise your leg until you feel a gentle stretch behind your knee (hamstring). ? Do not let your knee bend while you do this. ? Keep your other leg flat on the floor. 4. Hold this position for __________ seconds. Repeat __________ times. Complete this exercise __________ times a day. Strengthening exercises These exercises build strength and endurance in your knee. Endurance is the ability to use your muscles for a long time, even after they get tired. Quadriceps, isometric This exercise stretches the muscles in front of your thigh (  quadriceps) without moving your knee joint (isometric). 1. Lie on your back with your left / right leg extended and your other knee bent. Put a rolled towel or small pillow under your knee if told by your health care provider. 2. Slowly tense the muscles in the front of your left / right thigh. You should see your kneecap slide up toward your hip or see increased dimpling just above the knee. This motion will push the back of the knee toward the floor. 3. For __________ seconds, hold the muscle as tight as you can without increasing your pain. 4. Relax the muscles slowly and completely. Repeat __________ times. Complete this exercise __________ times a day. Straight leg raises This exercise stretches the muscles in front of your thigh (quadriceps) and the muscles that move your hips (hip  flexors). 1. Lie on your back with your left / right leg extended and your other knee bent. 2. Tense the muscles in the front of your left / right thigh. You should see your kneecap slide up or see increased dimpling just above the knee. Your thigh may even shake a bit. 3. Keep these muscles tight as you raise your leg 4-6 inches (10-15 cm) off the floor. Do not let your knee bend. 4. Hold this position for __________ seconds. 5. Keep these muscles tense as you lower your leg. 6. Relax your muscles slowly and completely after each repetition. Repeat __________ times. Complete this exercise __________ times a day. Hamstring, isometric 1. Lie on your back on a firm surface. 2. Bend your left / right knee about __________ degrees. 3. Dig your left / right heel into the surface as if you are trying to pull it toward your buttocks. Tighten the muscles in the back of your thighs (hamstring) to "dig" as hard as you can without increasing any pain. 4. Hold this position for __________ seconds. 5. Release the tension gradually and allow your muscles to relax completely for __________ seconds after each repetition. Repeat __________ times. Complete this exercise __________ times a day. Hamstring curls If told by your health care provider, do this exercise while wearing ankle weights. Begin with __________ lb weights. Then increase the weight by 1 lb (0.5 kg) increments. Do not wear ankle weights that are more than __________ lb. 1. Lie on your abdomen with your legs straight. 2. Bend your left / right knee as far as you can without feeling pain. Keep your hips flat against the floor. 3. Hold this position for __________ seconds. 4. Slowly lower your leg to the starting position. Repeat __________ times. Complete this exercise __________ times a day. Squats This exercise strengthens the muscles in front of your thigh and knee (quadriceps). 1. Stand in front of a table, with your feet and knees pointing  straight ahead. You may rest your hands on the table for balance but not for support. 2. Slowly bend your knees and lower your hips like you are going to sit in a chair. ? Keep your weight over your heels, not over your toes. ? Keep your lower legs upright so they are parallel with the table legs. ? Do not let your hips go lower than your knees. ? Do not bend lower than told by your health care provider. ? If your knee pain increases, do not bend as low. 3. Hold the squat position for __________ seconds. 4. Slowly push with your legs to return to standing. Do not use your hands to pull yourself to standing.  Repeat __________ times. Complete this exercise __________ times a day. Wall slides This exercise strengthens the muscles in front of your thigh and knee (quadriceps). 1. Lean your back against a smooth wall or door, and walk your feet out 18-24 inches (46-61 cm) from it. 2. Place your feet hip-width apart. 3. Slowly slide down the wall or door until your knees bend __________ degrees. Keep your knees over your heels, not over your toes. Keep your knees in line with your hips. 4. Hold this position for __________ seconds. Repeat __________ times. Complete this exercise __________ times a day. Straight leg raises This exercise strengthens the muscles that rotate the leg at the hip and move it away from your body (hip abductors). 1. Lie on your side with your left / right leg in the top position. Lie so your head, shoulder, knee, and hip line up. You may bend your bottom knee to help you keep your balance. 2. Roll your hips slightly forward so your hips are stacked directly over each other and your left / right knee is facing forward. 3. Leading with your heel, lift your top leg 4-6 inches (10-15 cm). You should feel the muscles in your outer hip lifting. ? Do not let your foot drift forward. ? Do not let your knee roll toward the ceiling. 4. Hold this position for __________  seconds. 5. Slowly return your leg to the starting position. 6. Let your muscles relax completely after each repetition. Repeat __________ times. Complete this exercise __________ times a day. Straight leg raises This exercise stretches the muscles that move your hips away from the front of the pelvis (hip extensors). 1. Lie on your abdomen on a firm surface. You can put a pillow under your hips if that is more comfortable. 2. Tense the muscles in your buttocks and lift your left / right leg about 4-6 inches (10-15 cm). Keep your knee straight as you lift your leg. 3. Hold this position for __________ seconds. 4. Slowly lower your leg to the starting position. 5. Let your leg relax completely after each repetition. Repeat __________ times. Complete this exercise __________ times a day. This information is not intended to replace advice given to you by your health care provider. Make sure you discuss any questions you have with your health care provider. Document Revised: 08/07/2018 Document Reviewed: 08/07/2018 Elsevier Patient Education  2020 [employer].

## 2020-11-12 ENCOUNTER — Ambulatory Visit: Payer: Self-pay | Attending: Internal Medicine

## 2020-11-12 DIAGNOSIS — Z23 Encounter for immunization: Secondary | ICD-10-CM

## 2020-11-12 NOTE — Progress Notes (Addendum)
   Covid-19 Vaccination Clinic  Name:  Gina Mitchell    MRN: [id number] DOB: 1961/01/22  11/12/2020  Gina Mitchell was observed post Covid-19 immunization for 30 minutes based on pre-vaccination screening without incident. She was provided with Vaccine Information Sheet and instruction to access the V-Safe system.   Gina Mitchell was instructed to call 911 with any severe reactions post vaccine: Marland Kitchen Difficulty breathing  . Swelling of face and throat  . A fast heartbeat  . A bad rash all over body  . Dizziness and weakness   Immunizations Administered    Name Date Dose VIS Date Route   Pfizer COVID-19 Vaccine 11/12/2020  3:31 PM 0.3 mL 08/19/2020 Intramuscular   Manufacturer: [place]   Lot: [id number]   NDC: [address]-7

## 2020-11-27 ENCOUNTER — Other Ambulatory Visit (HOSPITAL_COMMUNITY): Payer: Self-pay | Admitting: Optometry

## 2020-11-27 ENCOUNTER — Other Ambulatory Visit: Payer: Self-pay

## 2020-11-27 DIAGNOSIS — Z20822 Contact with and (suspected) exposure to covid-19: Secondary | ICD-10-CM | POA: Diagnosis not present

## 2020-11-27 MED FILL — PREDNISOLONE AC 1% EYE DROP: 1 | 12 days supply | Qty: 5 | Fill #0

## 2020-11-29 LAB — NOVEL CORONAVIRUS, NAA: SARS-CoV-2, NAA: NOT DETECTED

## 2020-11-29 LAB — SARS-COV-2, NAA 2 DAY TAT

## 2020-12-18 ENCOUNTER — Other Ambulatory Visit: Payer: Self-pay | Admitting: Rheumatology

## 2020-12-18 ENCOUNTER — Other Ambulatory Visit: Payer: Self-pay

## 2020-12-18 DIAGNOSIS — M0579 Rheumatoid arthritis with rheumatoid factor of multiple sites without organ or systems involvement: Secondary | ICD-10-CM

## 2020-12-18 DIAGNOSIS — Z79899 Other long term (current) drug therapy: Secondary | ICD-10-CM | POA: Diagnosis not present

## 2020-12-18 MED FILL — METHOTREXATE 25 MG/ML VIAL: 50 | 50 days supply | Qty: 10 | Fill #0

## 2020-12-18 NOTE — Telephone Encounter (Signed)
Last Visit: 10/05/2020 Next Visit: 03/01/2021 Labs: 08/24/2020 CBC and CMP are normal.  Current Dose per office note 10/05/2020: Methotrexate 0.8 ml every 7 days  DX: Rheumatoid arthritis involving multiple sites with positive rheumatoid factor   Last Fill: 09/29/2020  Patient advised she is due to update labs. Patient states she will come this afternoon to update.   Okay to refill MTX?

## 2020-12-19 LAB — COMPLETE METABOLIC PANEL WITH GFR
AG Ratio: 1.8 (calc) (ref 1.0–2.5)
ALT: 7 U/L (ref 6–29)
AST: 16 U/L (ref 10–35)
Albumin: 3.9 g/dL (ref 3.6–5.1)
Alkaline phosphatase (APISO): 60 U/L (ref 37–153)
BUN: 16 mg/dL (ref 7–25)
CO2: 31 mmol/L (ref 20–32)
Calcium: 8.8 mg/dL (ref 8.6–10.4)
Chloride: 106 mmol/L (ref 98–110)
Creat: 0.8 mg/dL (ref 0.50–1.05)
GFR, Est African American: 94 mL/min/{1.73_m2} (ref >=60)
GFR, Est Non African American: 81 mL/min/{1.73_m2} (ref >=60)
Globulin: 2.2 g/dL (calc) (ref 1.9–3.7)
Glucose, Bld: 74 mg/dL (ref 65–99)
Potassium: 4.2 mmol/L (ref 3.5–5.3)
Sodium: 142 mmol/L (ref 135–146)
Total Bilirubin: 0.6 mg/dL (ref 0.2–1.2)
Total Protein: 6.1 g/dL (ref 6.1–8.1)

## 2020-12-19 LAB — CBC WITH DIFFERENTIAL/PLATELET
Absolute Monocytes: 392 cells/uL (ref 200–950)
Basophils Absolute: 67 cells/uL (ref 0–200)
Basophils Relative: 1.2 %
Eosinophils Absolute: 151 cells/uL (ref 15–500)
Eosinophils Relative: 2.7 %
HCT: 35.7 % (ref 35.0–45.0)
Hemoglobin: 12.6 g/dL (ref 11.7–15.5)
Lymphs Abs: 1876 cells/uL (ref 850–3900)
MCH: 32.7 pg (ref 27.0–33.0)
MCHC: 35.3 g/dL (ref 32.0–36.0)
MCV: 92.7 fL (ref 80.0–100.0)
MPV: 10.1 fL (ref 7.5–12.5)
Monocytes Relative: 7 %
Neutro Abs: 3114 cells/uL (ref 1500–7800)
Neutrophils Relative %: 55.6 %
Platelets: 220 10*3/uL (ref 140–400)
RBC: 3.85 10*6/uL (ref 3.80–5.10)
RDW: 13.2 % (ref 11.0–15.0)
Total Lymphocyte: 33.5 %
WBC: 5.6 10*3/uL (ref 3.8–10.8)

## 2020-12-21 NOTE — Progress Notes (Signed)
CBC and CMP normal

## 2021-01-06 DIAGNOSIS — H43813 Vitreous degeneration, bilateral: Secondary | ICD-10-CM | POA: Diagnosis not present

## 2021-02-15 NOTE — Progress Notes (Signed)
Office Visit Note  Patient: Gina Mitchell             Date of Birth: 1961-08-01           MRN: [id number]             PCP: Shirline Frees, MD Referring: Shirline Frees, MD Visit Date: 03/01/2021 Occupation: @GUAROCC @  Subjective:  Medication monitoring.   History of Present Illness: Gina Mitchell is a 60 y.o. female with a history of seropositive rheumatoid arthritis.  She states she has not had any major flares.  She has off-and-on discomfort in her joints.  The pain is mostly in her left ankle and occasionally in her right [facility] joint.  Her DEXA has been followed by her GYN.  She has been under a lot of stress due to her husband's health.  Activities of Daily Living:  Patient reports morning stiffness for 0 minutes.   Patient Denies nocturnal pain.  Difficulty dressing/grooming: Denies Difficulty climbing stairs: Denies Difficulty getting out of chair: Denies Difficulty using hands for taps, buttons, cutlery, and/or writing: Denies  Review of Systems  Constitutional: Positive for fatigue.  HENT: Positive for mouth sores and mouth dryness. Negative for nose dryness.   Eyes: Positive for dryness. Negative for pain and itching.  Respiratory: Negative for shortness of breath and difficulty breathing.   Cardiovascular: Negative for chest pain and palpitations.  Gastrointestinal: Positive for constipation and nausea. Negative for blood in stool and diarrhea.  Endocrine: Negative for increased urination.  Genitourinary: Negative for difficulty urinating.  Musculoskeletal: Negative for arthralgias, joint pain, joint swelling, myalgias, morning stiffness, muscle tenderness and myalgias.  Skin: Negative for color change, rash and redness.  Allergic/Immunologic: Negative for susceptible to infections.  Neurological: Positive for dizziness and numbness. Negative for headaches, memory loss and weakness.  Hematological: Negative for bruising/bleeding tendency.  Psychiatric/Behavioral:  Negative for confusion.    PMFS History:  Patient Active Problem List   Diagnosis Date Noted  . Primary osteoarthritis of both hands 09/08/2017  . Primary osteoarthritis of both feet 09/08/2017  . Primary osteoarthritis of both knees 09/08/2017  . History of gastroesophageal reflux (GERD) 09/08/2017  . Dyslipidemia 09/08/2017  . Osteopenia of multiple sites 09/08/2017  . Right ankle swelling 06/26/2017  . Rheumatoid arthritis ([place]) 06/26/2017  . DDD (degenerative disc disease), cervical 09/13/2016  . Localized primary carpometacarpal osteoarthritis, right 08/26/2016  . Rotator cuff dysfunction, right 08/26/2016  . Metatarsalgia with a left third MTP synovitis 08/01/2016  . Metatarsal stress fracture of left foot 04/19/2016  . Abnormal EKG 11/06/2014  . Chest pain 06/10/2014  . Plantar fasciitis, bilateral 11/25/2013    Past Medical History:  Diagnosis Date  . Allergic rhinitis   . Esophageal reflux   . Incomplete right bundle branch block   . Rheumatoid arthritis ([place])   . Situational anxiety     Family History  Problem Relation Age of Onset  . Prostate cancer Father   . Arrhythmia Father   . Breast cancer Sister   . Hypertension Mother    Past Surgical History:  Procedure Laterality Date  . BREAST EXCISIONAL BIOPSY    . Breast mass removal     Social History   Social History Narrative  . Not on file   Immunization History  Administered Date(s) Administered  . PFIZER(Purple Top)SARS-COV-2 Vaccination 11/25/2019, 12/16/2019, 11/12/2020     Objective: Vital Signs: BP 119/80 (BP Location: Left Arm, Patient Position: Sitting, Cuff Size: Normal)   Pulse 62  Resp 14   Ht 5\' 2"  (1.575 m)   Wt 121 lb 12.8 oz (55.2 kg)   BMI 22.28 kg/m    Physical Exam Vitals and nursing note reviewed.  Constitutional:      Appearance: She is well-developed.  HENT:     Head: Normocephalic and atraumatic.  Eyes:     Conjunctiva/sclera: Conjunctivae normal.  Cardiovascular:      Rate and Rhythm: Normal rate and regular rhythm.     Heart sounds: Normal heart sounds.  Pulmonary:     Effort: Pulmonary effort is normal.     Breath sounds: Normal breath sounds.  Abdominal:     General: Bowel sounds are normal.     Palpations: Abdomen is soft.  Musculoskeletal:     Cervical back: Normal range of motion.  Lymphadenopathy:     Cervical: No cervical adenopathy.  Skin:    General: Skin is warm and dry.     Capillary Refill: Capillary refill takes less than 2 seconds.  Neurological:     Mental Status: She is alert and oriented to person, place, and time.  Psychiatric:        Behavior: Behavior normal.      Musculoskeletal Exam: C-spine was in good range of motion.  Shoulder joints, elbow joints, wrist joints, MCPs PIPs and DIPs with good range of motion.  She had prominence of her left CMC joint which was tender.  Hip joints, knee joints, ankles, MTPs and PIPs with good range of motion with no synovitis.  CDAI Exam: CDAI Score: 0  Patient Global: 0 mm; Provider Global: 0 mm Swollen: 0 ; Tender: 0  Joint Exam 03/01/2021   No joint exam has been documented for this visit   There is currently no information documented on the homunculus. Go to the Rheumatology activity and complete the homunculus joint exam.  Investigation: No additional findings.  Imaging: No results found.  Recent Labs: Lab Results  Component Value Date   WBC 5.6 12/18/2020   HGB 12.6 12/18/2020   PLT 220 12/18/2020   NA 142 12/18/2020   K 4.2 12/18/2020   CL 106 12/18/2020   CO2 31 12/18/2020   GLUCOSE 74 12/18/2020   BUN 16 12/18/2020   CREATININE 0.80 12/18/2020   BILITOT 0.6 12/18/2020   ALKPHOS 79 06/26/2017   AST 16 12/18/2020   ALT 7 12/18/2020   PROT 6.1 12/18/2020   ALBUMIN 4.1 06/26/2017   CALCIUM 8.8 12/18/2020   GFRAA 94 12/18/2020   QFTBGOLD NEGATIVE 08/14/2017    Speciality Comments: No specialty comments available.  Procedures:  No procedures  performed Allergies: Crab [shellfish allergy]   Assessment / Plan:     Visit Diagnoses: Rheumatoid arthritis involving multiple sites with positive rheumatoid factor ([place]) -patient is clinically doing well.  She had no synovitis on examination today.  She states she has intermittent arthralgias but she has not had any swelling.  She is interested in lowering the dose of methotrexate from 0.6 to 0.5 mL subcu weekly.  I was in agreement.  I advised in case she starts experiencing increased pain or swelling she may go to the previous dosing.  She also mentioned that she has some GI side effects when she takes methotrexate dose.  She was interested in switching from subcutaneous to oral methotrexate.  I discussed that oral methotrexate will cause more GI side effects.  At this point she will continue subcutaneous methotrexate.  I will check sed rate, RF and anti-CCP today.  Plan: Sedimentation rate, Rheumatoid factor, Cyclic citrul peptide antibody, IgG  High risk medication use - Methotrexate 0.6 ml every 7 days and folic acid 1 mg 1 tablet daily.  - Plan: CBC with Differential/Platelet, COMPLETE METABOLIC PANEL WITH GFR.  She was advised to discontinue methotrexate if she develops an infection and resume after the infection resolves.  Per ACR recommendations the fourth dose for COVID-19 vaccine was also discussed and the instructions were placed in the AVS.  Instructions regarding the pneumococcal and Shingrix vaccines were given.  Primary osteoarthritis of both hands-she has been having discomfort in her right CMC joint.  Muscle strength exercises and joint protection was discussed.  Chronic pain of both knees - X-ray showed mild osteoarthritis and mild chondromalacia patella.  Primary osteoarthritis of both knees-she denies any discomfort currently.  Primary osteoarthritis of both feet-she gives history of intermittent discomfort in her left ankle joint.  No synovitis was noted.  DDD (degenerative  disc disease), cervical-she had good range of motion without discomfort.  Other fatigue  Primary insomnia-she has been under a lot of stress due to her husband's illness.  He had a stroke and now he is having new onset seizures.  Osteopenia of multiple sites-DEXA is done by her PCP.  Retinal edema  History of gastroesophageal reflux (GERD)  Dyslipidemia-increased risk of heart disease with rheumatoid arthritis was discussed.  Instructions regarding dietary management and exercise were placed in the AVS.  Orders: Orders Placed This Encounter  Procedures  . CBC with Differential/Platelet  . COMPLETE METABOLIC PANEL WITH GFR  . Sedimentation rate  . Rheumatoid factor  . Cyclic citrul peptide antibody, IgG   Meds ordered this encounter  Medications  . methotrexate 50 MG/2ML injection    Sig: INJECT 0.6ML INTO THE SKIN ONCE WEEKLY.    Dispense:  8 mL    Refill:  0     Follow-Up Instructions: Return in about 5 months (around 08/01/2021) for Rheumatoid arthritis, Osteoarthritis.   Bo Merino, MD  Note - This record has been created using Editor, commissioning.  Chart creation errors have been sought, but may not always  have been located. Such creation errors do not reflect on  the standard of medical care.

## 2021-02-25 ENCOUNTER — Other Ambulatory Visit: Payer: Self-pay | Admitting: Rheumatology

## 2021-02-25 ENCOUNTER — Other Ambulatory Visit (HOSPITAL_COMMUNITY): Payer: Self-pay

## 2021-02-25 DIAGNOSIS — M0579 Rheumatoid arthritis with rheumatoid factor of multiple sites without organ or systems involvement: Secondary | ICD-10-CM

## 2021-02-25 NOTE — Telephone Encounter (Signed)
Next Visit: 03/01/2021  Last Visit: 10/05/2020,   Last Fill: 12/18/2020  DX:   Rheumatoid arthritis involving multiple sites with positive rheumatoid factor  Current Dose per office note 10/05/2020, Methotrexate 0.8 ml every 7 days  Labs: 12/18/2020, CBC and CMP normal.  Okay to refill MTX?

## 2021-02-26 ENCOUNTER — Other Ambulatory Visit (HOSPITAL_COMMUNITY): Payer: Self-pay

## 2021-02-26 MED ORDER — METHOTREXATE SODIUM CHEMO INJECTION 50 MG/2ML
INTRAMUSCULAR | 0 refills | Status: DC
  Filled 2021-02-26: qty 10, 56d supply, fill #0

## 2021-03-01 ENCOUNTER — Other Ambulatory Visit: Payer: Self-pay

## 2021-03-01 ENCOUNTER — Ambulatory Visit: Payer: 59 | Admitting: Rheumatology

## 2021-03-01 ENCOUNTER — Encounter: Payer: Self-pay | Admitting: Rheumatology

## 2021-03-01 VITALS — BP 119/80 | HR 62 | Resp 14 | Ht 62.0 in | Wt 121.8 lb

## 2021-03-01 DIAGNOSIS — M0579 Rheumatoid arthritis with rheumatoid factor of multiple sites without organ or systems involvement: Secondary | ICD-10-CM

## 2021-03-01 DIAGNOSIS — R5383 Other fatigue: Secondary | ICD-10-CM | POA: Diagnosis not present

## 2021-03-01 DIAGNOSIS — M19041 Primary osteoarthritis, right hand: Secondary | ICD-10-CM | POA: Diagnosis not present

## 2021-03-01 DIAGNOSIS — F5101 Primary insomnia: Secondary | ICD-10-CM

## 2021-03-01 DIAGNOSIS — Z8639 Personal history of other endocrine, nutritional and metabolic disease: Secondary | ICD-10-CM

## 2021-03-01 DIAGNOSIS — M170 Bilateral primary osteoarthritis of knee: Secondary | ICD-10-CM

## 2021-03-01 DIAGNOSIS — M25561 Pain in right knee: Secondary | ICD-10-CM | POA: Diagnosis not present

## 2021-03-01 DIAGNOSIS — Z79899 Other long term (current) drug therapy: Secondary | ICD-10-CM

## 2021-03-01 DIAGNOSIS — M19071 Primary osteoarthritis, right ankle and foot: Secondary | ICD-10-CM | POA: Diagnosis not present

## 2021-03-01 DIAGNOSIS — M19042 Primary osteoarthritis, left hand: Secondary | ICD-10-CM

## 2021-03-01 DIAGNOSIS — E785 Hyperlipidemia, unspecified: Secondary | ICD-10-CM

## 2021-03-01 DIAGNOSIS — H3581 Retinal edema: Secondary | ICD-10-CM

## 2021-03-01 DIAGNOSIS — Z8719 Personal history of other diseases of the digestive system: Secondary | ICD-10-CM

## 2021-03-01 DIAGNOSIS — M8589 Other specified disorders of bone density and structure, multiple sites: Secondary | ICD-10-CM

## 2021-03-01 DIAGNOSIS — M25562 Pain in left knee: Secondary | ICD-10-CM

## 2021-03-01 DIAGNOSIS — G8929 Other chronic pain: Secondary | ICD-10-CM

## 2021-03-01 DIAGNOSIS — M19072 Primary osteoarthritis, left ankle and foot: Secondary | ICD-10-CM

## 2021-03-01 DIAGNOSIS — M5030 Other cervical disc degeneration, unspecified cervical region: Secondary | ICD-10-CM

## 2021-03-01 MED ORDER — METHOTREXATE SODIUM CHEMO INJECTION 50 MG/2ML: INTRAMUSCULAR | 0 refills | Status: DC

## 2021-03-01 NOTE — Patient Instructions (Signed)
Standing Labs We placed an order today for your standing lab work.   Please have your standing labs drawn in August and every 3 months  If possible, please have your labs drawn 2 weeks prior to your appointment so that the provider can discuss your results at your appointment.  We have open lab daily Monday through Thursday from 1:30-4:30 PM and Friday from 1:30-4:00 PM at the office of Dr. Bo Merino, [facility].   Please be advised, all patients with office appointments requiring lab work will take precedents over walk-in lab work.  If possible, please come for your lab work on Monday and Friday afternoons, as you may experience shorter wait times. The office is located at [address], [place], [place], [place] [address] No appointment is necessary.   Labs are drawn by [facility]. Please bring your co-pay at the time of your lab draw.  You may receive a bill from [facility] for your lab work.  If you wish to have your labs drawn at another location, please call the office 24 hours in advance to send orders.  If you have any questions regarding directions or hours of operation,  please call [phone].   As a reminder, please drink plenty of water prior to coming for your lab work. Thanks!  COVID-19 vaccine recommendations:   COVID-19 vaccine is recommended for everyone (unless you are allergic to a vaccine component), even if you are on a medication that suppresses your immune system.   If you are on Methotrexate, Cellcept (mycophenolate), Rinvoq, Morrie Sheldon, and Olumiant- hold the medication for 1 week after each vaccine. Hold Methotrexate for 2 weeks after the single dose COVID-19 vaccine.   Other recommendations are that the patients on immunosuppressive agents should get the first 3 COVID-19 vaccines 1 month apart and then a fourth dose (booster) 3 months after the third dose.    Do not take Tylenol or any anti-inflammatory medications (NSAIDs) 24 hours prior to the  COVID-19 vaccination.   There is no direct evidence about the efficacy of the COVID-19 vaccine in individuals who are on medications that suppress the immune system.   Even if you are fully vaccinated, and you are on any medications that suppress your immune system, please continue to wear a mask, maintain at least six feet social distance and practice hand hygiene.   If you develop a COVID-19 infection, please contact your PCP or our office to determine if you need monoclonal antibody infusion.  The booster vaccine is now available for immunocompromised patients.   Please see the following web sites for updated information.   [email or web address]   Vaccines You are taking a medication(s) that can suppress your immune system.  The following immunizations are recommended: . Flu annually . Covid-19  . Pneumonia (Pneumovax 23 and Prevnar 13 spaced at least 1 year apart) . Shingrix (after age 89)  Please check with your PCP to make sure you are up to date.  Heart Disease Prevention   Your inflammatory disease increases your risk of heart disease which includes heart attack, stroke, atrial fibrillation (irregular heartbeats), high blood pressure, heart failure and atherosclerosis (plaque in the arteries).  It is important to reduce your risk by:   . Keep blood pressure, cholesterol, and blood sugar at healthy levels   . Smoking Cessation   . Maintain a healthy weight  o BMI 20-25   . Eat a healthy diet  o Plenty of fresh fruit, vegetables, and whole grains  o Limit  saturated fats, foods high in sodium, and added sugars  o DASH and Mediterranean diet   . Increase physical activity  o Recommend moderate physically activity for 150 minutes per week/ 30 minutes a day for five days a week These can be broken up into three separate ten-minute sessions during the day.   . Reduce Stress  . Meditation, slow breathing  exercises, yoga, coloring books  . Dental visits twice a year

## 2021-03-02 LAB — CBC WITH DIFFERENTIAL/PLATELET
Absolute Monocytes: 410 cells/uL (ref 200–950)
Basophils Absolute: 83 cells/uL (ref 0–200)
Basophils Relative: 1.3 %
Eosinophils Absolute: 173 cells/uL (ref 15–500)
Eosinophils Relative: 2.7 %
HCT: 42.4 % (ref 35.0–45.0)
Hemoglobin: 14.1 g/dL (ref 11.7–15.5)
Lymphs Abs: 1914 cells/uL (ref 850–3900)
MCH: 31.4 pg (ref 27.0–33.0)
MCHC: 33.3 g/dL (ref 32.0–36.0)
MCV: 94.4 fL (ref 80.0–100.0)
MPV: 9.8 fL (ref 7.5–12.5)
Monocytes Relative: 6.4 %
Neutro Abs: 3821 cells/uL (ref 1500–7800)
Neutrophils Relative %: 59.7 %
Platelets: 253 10*3/uL (ref 140–400)
RBC: 4.49 10*6/uL (ref 3.80–5.10)
RDW: 13.1 % (ref 11.0–15.0)
Total Lymphocyte: 29.9 %
WBC: 6.4 10*3/uL (ref 3.8–10.8)

## 2021-03-02 LAB — COMPLETE METABOLIC PANEL WITH GFR
AG Ratio: 1.9 (calc) (ref 1.0–2.5)
ALT: 7 U/L (ref 6–29)
AST: 17 U/L (ref 10–35)
Albumin: 4.3 g/dL (ref 3.6–5.1)
Alkaline phosphatase (APISO): 69 U/L (ref 37–153)
BUN: 20 mg/dL (ref 7–25)
CO2: 30 mmol/L (ref 20–32)
Calcium: 9.5 mg/dL (ref 8.6–10.4)
Chloride: 104 mmol/L (ref 98–110)
Creat: 0.81 mg/dL (ref 0.50–1.05)
GFR, Est African American: 92 mL/min/{1.73_m2} (ref >=60)
GFR, Est Non African American: 79 mL/min/{1.73_m2} (ref >=60)
Globulin: 2.3 g/dL (calc) (ref 1.9–3.7)
Glucose, Bld: 82 mg/dL (ref 65–99)
Potassium: 4.8 mmol/L (ref 3.5–5.3)
Sodium: 141 mmol/L (ref 135–146)
Total Bilirubin: 0.5 mg/dL (ref 0.2–1.2)
Total Protein: 6.6 g/dL (ref 6.1–8.1)

## 2021-03-02 LAB — RHEUMATOID FACTOR: Rheumatoid fact SerPl-aCnc: 17 IU/mL — ABNORMAL HIGH (ref <14)

## 2021-03-02 LAB — CYCLIC CITRUL PEPTIDE ANTIBODY, IGG: Cyclic Citrullin Peptide Ab: 113 UNITS — ABNORMAL HIGH

## 2021-03-02 LAB — SEDIMENTATION RATE: Sed Rate: 6 mm/h (ref 0–30)

## 2021-03-16 DIAGNOSIS — M069 Rheumatoid arthritis, unspecified: Secondary | ICD-10-CM | POA: Diagnosis not present

## 2021-03-16 DIAGNOSIS — H2513 Age-related nuclear cataract, bilateral: Secondary | ICD-10-CM | POA: Diagnosis not present

## 2021-03-16 DIAGNOSIS — H3581 Retinal edema: Secondary | ICD-10-CM | POA: Diagnosis not present

## 2021-04-08 ENCOUNTER — Other Ambulatory Visit (HOSPITAL_BASED_OUTPATIENT_CLINIC_OR_DEPARTMENT_OTHER): Payer: Self-pay

## 2021-04-08 MED ORDER — COVID-19 AT HOME ANTIGEN TEST VI KIT
PACK | 0 refills | Status: DC
  Filled 2021-04-08: qty 4, 8d supply, fill #0

## 2021-04-12 ENCOUNTER — Other Ambulatory Visit (HOSPITAL_BASED_OUTPATIENT_CLINIC_OR_DEPARTMENT_OTHER): Payer: Self-pay

## 2021-04-27 DIAGNOSIS — J069 Acute upper respiratory infection, unspecified: Secondary | ICD-10-CM | POA: Diagnosis not present

## 2021-04-28 ENCOUNTER — Other Ambulatory Visit (HOSPITAL_BASED_OUTPATIENT_CLINIC_OR_DEPARTMENT_OTHER): Payer: Self-pay

## 2021-04-28 ENCOUNTER — Other Ambulatory Visit (HOSPITAL_COMMUNITY): Payer: Self-pay

## 2021-04-28 DIAGNOSIS — J069 Acute upper respiratory infection, unspecified: Secondary | ICD-10-CM | POA: Diagnosis not present

## 2021-04-28 DIAGNOSIS — J029 Acute pharyngitis, unspecified: Secondary | ICD-10-CM | POA: Diagnosis not present

## 2021-04-28 DIAGNOSIS — U071 COVID-19: Secondary | ICD-10-CM | POA: Diagnosis not present

## 2021-04-28 MED ORDER — AZITHROMYCIN 250 MG PO TABS
ORAL_TABLET | ORAL | 0 refills | Status: DC
  Filled 2021-04-28 (×2): qty 6, 5d supply, fill #0

## 2021-05-05 ENCOUNTER — Telehealth: Payer: Self-pay | Admitting: *Deleted

## 2021-05-05 NOTE — Telephone Encounter (Signed)
Patient states she had Covid 05/26/2021. Patient states she is no longer having symptoms. Patient states she has already tested negative. Patient states her symptoms resolved 05/03/2021. Patient advised to hold her MTX for 1 week after her symptoms have resolved. Patient expressed understanding.

## 2021-05-07 ENCOUNTER — Other Ambulatory Visit (HOSPITAL_BASED_OUTPATIENT_CLINIC_OR_DEPARTMENT_OTHER): Payer: Self-pay

## 2021-05-07 MED FILL — Folic Acid Tab 1 MG: ORAL | 90 days supply | Qty: 180 | Fill #0 | Status: AC

## 2021-05-12 ENCOUNTER — Other Ambulatory Visit (HOSPITAL_BASED_OUTPATIENT_CLINIC_OR_DEPARTMENT_OTHER): Payer: Self-pay

## 2021-05-12 DIAGNOSIS — H16143 Punctate keratitis, bilateral: Secondary | ICD-10-CM | POA: Diagnosis not present

## 2021-05-12 MED ORDER — PREDNISOLONE ACETATE 1 % OP SUSP
OPHTHALMIC | 0 refills | Status: DC
  Filled 2021-05-12: qty 5, 5d supply, fill #0

## 2021-05-13 ENCOUNTER — Other Ambulatory Visit (HOSPITAL_BASED_OUTPATIENT_CLINIC_OR_DEPARTMENT_OTHER): Payer: Self-pay

## 2021-05-13 ENCOUNTER — Other Ambulatory Visit: Payer: Self-pay | Admitting: Rheumatology

## 2021-05-13 DIAGNOSIS — M0579 Rheumatoid arthritis with rheumatoid factor of multiple sites without organ or systems involvement: Secondary | ICD-10-CM

## 2021-05-13 MED ORDER — METHOTREXATE SODIUM CHEMO INJECTION 50 MG/2ML
INTRAMUSCULAR | 0 refills | Status: DC
Start: 2021-05-13 — End: 2021-07-26
  Filled 2021-05-13: qty 6, 84d supply, fill #0

## 2021-05-13 NOTE — Telephone Encounter (Signed)
Called patient and patient states she is injecting methotrexate 0.34mL once weekly. Patient states this was discussed at her last office visit with Dr. Estanislado Pandy.   Per office note on 03/01/2021: "She is interested in lowering the dose of methotrexate from 0.6 to 0.5 mL subcu weekly.  I was in agreement."

## 2021-05-13 NOTE — Telephone Encounter (Signed)
Next Visit: 07/26/2021  Last Visit: 03/01/2021  Last Fill: 03/01/2021  DX: Rheumatoid arthritis involving multiple sites with positive rheumatoid factor  Current Dose per office note 03/01/2021: Methotrexate 0.6 ml every 7 days  Labs: 03/01/2021 CBC and CMP WNL  Okay to refill MTX?

## 2021-05-14 ENCOUNTER — Other Ambulatory Visit (HOSPITAL_BASED_OUTPATIENT_CLINIC_OR_DEPARTMENT_OTHER): Payer: Self-pay

## 2021-06-14 ENCOUNTER — Encounter: Payer: Self-pay | Admitting: Rheumatology

## 2021-06-29 DIAGNOSIS — Z6822 Body mass index (BMI) 22.0-22.9, adult: Secondary | ICD-10-CM | POA: Diagnosis not present

## 2021-06-29 DIAGNOSIS — M8580 Other specified disorders of bone density and structure, unspecified site: Secondary | ICD-10-CM | POA: Diagnosis not present

## 2021-06-29 DIAGNOSIS — Z01419 Encounter for gynecological examination (general) (routine) without abnormal findings: Secondary | ICD-10-CM | POA: Diagnosis not present

## 2021-06-29 DIAGNOSIS — Z1231 Encounter for screening mammogram for malignant neoplasm of breast: Secondary | ICD-10-CM | POA: Diagnosis not present

## 2021-06-29 DIAGNOSIS — Z1211 Encounter for screening for malignant neoplasm of colon: Secondary | ICD-10-CM | POA: Diagnosis not present

## 2021-07-12 DIAGNOSIS — Z1211 Encounter for screening for malignant neoplasm of colon: Secondary | ICD-10-CM | POA: Diagnosis not present

## 2021-07-12 NOTE — Progress Notes (Signed)
Office Visit Note  Patient: Gina Mitchell             Date of Birth: 08-Mar-1961           MRN: [id number]             PCP: Shirline Frees, MD Referring: Shirline Frees, MD Visit Date: 07/26/2021 Occupation: '@GUAROCC'$ @  Subjective:  Medication management.   History of Present Illness: Gina Mitchell is a 60 y.o. female with a history of seropositive rheumatoid arthritis.  She states she has been doing well as regards to her rheumatoid arthritis.  She has not had any joint pain or joint swelling in a while.  She reduced her methotrexate dose from 0.6 mL subcu weekly to 0.5 mL subcu weekly.  She has not had any flares.  She does not like to take subcutaneous injections.  She would prefer to switch to tablets.  She states she has been under a lot of stress due to her husband's illness.  She continues to have off-and-on discomfort in her knee joints but she has not had any joint swelling.  Activities of Daily Living:  Patient reports morning stiffness for 0 minutes.   Patient Denies nocturnal pain.  Difficulty dressing/grooming: Denies Difficulty climbing stairs: Denies Difficulty getting out of chair: Denies Difficulty using hands for taps, buttons, cutlery, and/or writing: Denies  Review of Systems  Constitutional:  Positive for fatigue.  HENT:  Positive for mouth sores and mouth dryness. Negative for nose dryness.   Eyes:  Positive for dryness. Negative for pain and itching.  Respiratory:  Negative for shortness of breath and difficulty breathing.   Cardiovascular:  Negative for chest pain and palpitations.  Gastrointestinal:  Negative for blood in stool, constipation and diarrhea.  Endocrine: Negative for increased urination.  Genitourinary:  Negative for difficulty urinating.  Musculoskeletal:  Positive for joint pain, joint pain, myalgias and myalgias. Negative for joint swelling, morning stiffness and muscle tenderness.  Skin:  Negative for color change, rash and redness.   Allergic/Immunologic: Negative for susceptible to infections.  Neurological:  Positive for headaches. Negative for dizziness, numbness, memory loss and weakness.  Hematological:  Negative for bruising/bleeding tendency.  Psychiatric/Behavioral:  Negative for confusion.    PMFS History:  Patient Active Problem List   Diagnosis Date Noted   Primary osteoarthritis of both hands 09/08/2017   Primary osteoarthritis of both feet 09/08/2017   Primary osteoarthritis of both knees 09/08/2017   History of gastroesophageal reflux (GERD) 09/08/2017   Dyslipidemia 09/08/2017   Osteopenia of multiple sites 09/08/2017   Right ankle swelling 06/26/2017   Rheumatoid arthritis ([place]) 06/26/2017   DDD (degenerative disc disease), cervical 09/13/2016   Localized primary carpometacarpal osteoarthritis, right 08/26/2016   Rotator cuff dysfunction, right 08/26/2016   Metatarsalgia with a left third MTP synovitis 08/01/2016   Metatarsal stress fracture of left foot 04/19/2016   Abnormal EKG 11/06/2014   Chest pain 06/10/2014   Plantar fasciitis, bilateral 11/25/2013    Past Medical History:  Diagnosis Date   Allergic rhinitis    Esophageal reflux    Incomplete right bundle branch block    Rheumatoid arthritis ([place])    Situational anxiety     Family History  Problem Relation Age of Onset   Prostate cancer Father    Arrhythmia Father    Breast cancer Sister    Hypertension Mother    Past Surgical History:  Procedure Laterality Date   BREAST EXCISIONAL BIOPSY     Breast mass  removal     Social History   Social History Narrative   Not on file   Immunization History  Administered Date(s) Administered   PFIZER(Purple Top)SARS-COV-2 Vaccination 11/25/2019, 12/16/2019, 11/12/2020     Objective: Vital Signs: BP 118/79 (BP Location: Left Arm, Patient Position: Sitting, Cuff Size: Normal)   Pulse 62   Ht '5\' 2"'$  (1.575 m)   Wt 121 lb 6.4 oz (55.1 kg)   BMI 22.20 kg/m    Physical  Exam Vitals and nursing note reviewed.  Constitutional:      Appearance: She is well-developed.  HENT:     Head: Normocephalic and atraumatic.  Eyes:     Conjunctiva/sclera: Conjunctivae normal.  Cardiovascular:     Rate and Rhythm: Normal rate and regular rhythm.     Heart sounds: Normal heart sounds.  Pulmonary:     Effort: Pulmonary effort is normal.     Breath sounds: Normal breath sounds.  Abdominal:     General: Bowel sounds are normal.     Palpations: Abdomen is soft.  Musculoskeletal:     Cervical back: Normal range of motion.  Lymphadenopathy:     Cervical: No cervical adenopathy.  Skin:    General: Skin is warm and dry.     Capillary Refill: Capillary refill takes less than 2 seconds.  Neurological:     Mental Status: She is alert and oriented to person, place, and time.  Psychiatric:        Behavior: Behavior normal.     Musculoskeletal Exam: C-spine was in good range of motion.  Shoulder joints, elbow joints, wrist joints, MCPs PIPs and DIPs with good range of motion with no synovitis.  She has some CMC PIP and DIP thickening consistent with osteoarthritis.  Hip joints, knee joints, ankles, MTPs and PIPs with good range of motion with no synovitis.  CDAI Exam: CDAI Score: 0.2  Patient Global: 1 mm; Provider Global: 1 mm Swollen: 0 ; Tender: 0  Joint Exam 07/26/2021   No joint exam has been documented for this visit   There is currently no information documented on the homunculus. Go to the Rheumatology activity and complete the homunculus joint exam.  Investigation: No additional findings.  Imaging: No results found.  Recent Labs: Lab Results  Component Value Date   WBC 6.4 03/01/2021   HGB 14.1 03/01/2021   PLT 253 03/01/2021   NA 141 03/01/2021   K 4.8 03/01/2021   CL 104 03/01/2021   CO2 30 03/01/2021   GLUCOSE 82 03/01/2021   BUN 20 03/01/2021   CREATININE 0.81 03/01/2021   BILITOT 0.5 03/01/2021   ALKPHOS 79 06/26/2017   AST 17  03/01/2021   ALT 7 03/01/2021   PROT 6.6 03/01/2021   ALBUMIN 4.1 06/26/2017   CALCIUM 9.5 03/01/2021   GFRAA 92 03/01/2021   QFTBGOLD NEGATIVE 08/14/2017    Speciality Comments: No specialty comments available.  Procedures:  No procedures performed Allergies: Crab [shellfish allergy]   Assessment / Plan:     Visit Diagnoses: Rheumatoid arthritis involving multiple sites with positive rheumatoid factor (HCC)-she had no synovitis on examination.  She has reduced methotrexate from 0.6 mL subcu weekly to 0.5 mL subcu weekly.  She wants to switch to oral methotrexate as she does not like taking subcutaneous injections.  Per her request we switched her to methotrexate 5 tablets p.o. weekly along with folic acid.  Side effects of oral methotrexate were discussed.  Efficacy of oral methotrexate in comparison to subcu methotrexate  was discussed.  Advised her to contact me in case she develops any side effects or if she has an adequate response to oral methotrexate.  High risk medication use - Methotrexate 0.6 ml every 7 days and folic acid 1 mg 1 tablet daily.  Labs are past due.  We will get labs today and then every 3 months to monitor for drug toxicity.  She has been advised to stop methotrexate in case she develops an infection and restart after the infection resolves.  Updated information regarding immunization was also placed in the AVS.  Primary osteoarthritis of both hands-joint protection muscle strengthening was discussed.  Chronic pain of both knees - X-ray showed mild osteoarthritis and mild chondromalacia patella.  Lower extremity muscle strengthening exercises were discussed.  Primary osteoarthritis of both knees  Primary osteoarthritis of both feet-she denies any discomfort today.  DDD (degenerative disc disease), cervical-she had good range of motion without discomfort.  Primary insomnia-doing better currently.  Other fatigue  Osteopenia of multiple sites - DEXA is done by  her PCP.  Retinal edema  Dyslipidemia-increased risk of heart disease with rheumatoid arthritis was discussed.  A handout on exercises and dietary modifications were discussed.  History of gastroesophageal reflux (GERD)  Orders: Orders Placed This Encounter  Procedures   CBC with Differential/Platelet   COMPLETE METABOLIC PANEL WITH GFR    Meds ordered this encounter  Medications   methotrexate (RHEUMATREX) 2.5 MG tablet    Sig: Take 5 tablets (12.5 mg total) by mouth once a week. Caution:Chemotherapy. Protect from light.    Dispense:  60 tablet    Refill:  0      Follow-Up Instructions: Return in about 5 months (around 12/26/2021) for Rheumatoid arthritis.   Bo Merino, MD  Note - This record has been created using Editor, commissioning.  Chart creation errors have been sought, but may not always  have been located. Such creation errors do not reflect on  the standard of medical care.

## 2021-07-13 ENCOUNTER — Other Ambulatory Visit (HOSPITAL_BASED_OUTPATIENT_CLINIC_OR_DEPARTMENT_OTHER): Payer: Self-pay

## 2021-07-13 MED ORDER — COVID-19 AT HOME ANTIGEN TEST VI KIT
PACK | 0 refills | Status: DC
  Filled 2021-07-13: qty 2, 4d supply, fill #0

## 2021-07-17 LAB — COLOGUARD: COLOGUARD: NEGATIVE

## 2021-07-26 ENCOUNTER — Encounter: Payer: Self-pay | Admitting: Rheumatology

## 2021-07-26 ENCOUNTER — Other Ambulatory Visit: Payer: Self-pay

## 2021-07-26 ENCOUNTER — Other Ambulatory Visit (HOSPITAL_BASED_OUTPATIENT_CLINIC_OR_DEPARTMENT_OTHER): Payer: Self-pay

## 2021-07-26 ENCOUNTER — Ambulatory Visit: Payer: 59 | Admitting: Rheumatology

## 2021-07-26 VITALS — BP 118/79 | HR 62 | Ht 62.0 in | Wt 121.4 lb

## 2021-07-26 DIAGNOSIS — Z8719 Personal history of other diseases of the digestive system: Secondary | ICD-10-CM

## 2021-07-26 DIAGNOSIS — E785 Hyperlipidemia, unspecified: Secondary | ICD-10-CM

## 2021-07-26 DIAGNOSIS — M5030 Other cervical disc degeneration, unspecified cervical region: Secondary | ICD-10-CM

## 2021-07-26 DIAGNOSIS — M19041 Primary osteoarthritis, right hand: Secondary | ICD-10-CM

## 2021-07-26 DIAGNOSIS — G8929 Other chronic pain: Secondary | ICD-10-CM

## 2021-07-26 DIAGNOSIS — M25561 Pain in right knee: Secondary | ICD-10-CM

## 2021-07-26 DIAGNOSIS — M19042 Primary osteoarthritis, left hand: Secondary | ICD-10-CM

## 2021-07-26 DIAGNOSIS — R5383 Other fatigue: Secondary | ICD-10-CM | POA: Diagnosis not present

## 2021-07-26 DIAGNOSIS — F5101 Primary insomnia: Secondary | ICD-10-CM | POA: Diagnosis not present

## 2021-07-26 DIAGNOSIS — M19071 Primary osteoarthritis, right ankle and foot: Secondary | ICD-10-CM

## 2021-07-26 DIAGNOSIS — M25562 Pain in left knee: Secondary | ICD-10-CM

## 2021-07-26 DIAGNOSIS — Z79899 Other long term (current) drug therapy: Secondary | ICD-10-CM

## 2021-07-26 DIAGNOSIS — M19072 Primary osteoarthritis, left ankle and foot: Secondary | ICD-10-CM

## 2021-07-26 DIAGNOSIS — M0579 Rheumatoid arthritis with rheumatoid factor of multiple sites without organ or systems involvement: Secondary | ICD-10-CM

## 2021-07-26 DIAGNOSIS — M8589 Other specified disorders of bone density and structure, multiple sites: Secondary | ICD-10-CM

## 2021-07-26 DIAGNOSIS — M170 Bilateral primary osteoarthritis of knee: Secondary | ICD-10-CM | POA: Diagnosis not present

## 2021-07-26 DIAGNOSIS — H3581 Retinal edema: Secondary | ICD-10-CM

## 2021-07-26 MED ORDER — METHOTREXATE 2.5 MG PO TABS
12.5000 mg | ORAL_TABLET | ORAL | 0 refills | Status: DC
  Filled 2021-07-26: qty 60, 84d supply, fill #0

## 2021-07-26 NOTE — Patient Instructions (Addendum)
Standing Labs We placed an order today for your standing lab work.   Please have your standing labs drawn in December and every 3 months  If possible, please have your labs drawn 2 weeks prior to your appointment so that the provider can discuss your results at your appointment.  Please note that you may see your imaging and lab results in MyChart before we have reviewed them. We may be awaiting multiple results to interpret others before contacting you. Please allow our office up to 72 hours to thoroughly review all of the results before contacting the office for clarification of your results.  We have open lab daily: Monday through Thursday from 1:30-4:30 PM and Friday from 1:30-4:00 PM at the office of Dr. Corri Delapaz, [facility].   Please be advised, all patients with office appointments requiring lab work will take precedent over walk-in lab work.  If possible, please come for your lab work on Monday and Friday afternoons, as you may experience shorter wait times. The office is located at [address], [address], [place], [place] [address] No appointment is necessary.   Labs are drawn by [facility]. Please bring your co-pay at the time of your lab draw.  You may receive a bill from [facility] for your lab work.  If you wish to have your labs drawn at another location, please call the office 24 hours in advance to send orders.  If you have any questions regarding directions or hours of operation,  please call [phone].   As a reminder, please drink plenty of water prior to coming for your lab work. Thanks!   Vaccines You are taking a medication(s) that can suppress your immune system.  The following immunizations are recommended: Flu annually Covid-19  Td/Tdap (tetanus, diphtheria, pertussis) every 10 years Pneumonia (Prevnar 15 then Pneumovax 23 at least 1 year apart.  Alternatively, can take Prevnar 20 without needing additional dose) Shingrix: 2 doses from 4  weeks to 6 months apart  Please check with your PCP to make sure you are up to date.   If you test POSITIVE for COVID19 and have MILD to MODERATE symptoms: First, call your PCP if you would like to receive COVID19 treatment AND Hold your medications during the infection and for at least 1 week after your symptoms have resolved: Injectable medication (Benlysta, Cimzia, Cosentyx, Enbrel, Humira, Orencia, Remicade, Simponi, Stelara, Taltz, Tremfya) Methotrexate Leflunomide (Arava) Azathioprine Mycophenolate (Cellcept) Xeljanz, Olumiant, or Rinvoq Otezla If you take Actemra or Kevzara, you DO NOT need to hold these for COVID19 infection.  If you test POSITIVE for COVID19 and have NO symptoms: First, call your PCP if you would like to receive COVID19 treatment AND Hold your medications for at least 10 days after the day that you tested positive Injectable medication (Benlysta, Cimzia, Cosentyx, Enbrel, Humira, Orencia, Remicade, Simponi, Stelara, Taltz, Tremfya) Methotrexate Leflunomide (Arava) Azathioprine Mycophenolate (Cellcept) Xeljanz, Olumiant, or Rinvoq Otezla If you take Actemra or Kevzara, you DO NOT need to hold these for COVID19 infection.  If you have signs or symptoms of an infection or start antibiotics: First, call your PCP for workup of your infection. Hold your medication through the infection, until you complete your antibiotics, and until symptoms resolve if you take the following: Injectable medication (Actemra, Benlysta, Cimzia, Cosentyx, Enbrel, Humira, Kevzara, Orencia, Remicade, Simponi, Stelara, Taltz, Tremfya) Methotrexate Leflunomide (Arava) Mycophenolate (Cellcept) Xeljanz, Olumiant, or Rinvoq  Heart Disease Prevention   Your inflammatory disease increases your risk of heart disease which   includes heart attack, stroke, atrial fibrillation (irregular heartbeats), high blood pressure, heart failure and atherosclerosis (plaque in the arteries).  It is  important to reduce your risk by:   Keep blood pressure, cholesterol, and blood sugar at healthy levels   Smoking Cessation   Maintain a healthy weight  BMI 20-25   Eat a healthy diet  Plenty of fresh fruit, vegetables, and whole grains  Limit saturated fats, foods high in sodium, and added sugars  DASH and Mediterranean diet   Increase physical activity  Recommend moderate physically activity for 150 minutes per week/ 30 minutes a day for five days a week These can be broken up into three separate ten-minute sessions during the day.   Reduce Stress  Meditation, slow breathing exercises, yoga, coloring books  Dental visits twice a year   Hand Exercises Hand exercises can be helpful for almost anyone. These exercises can strengthen the hands, improve flexibility and movement, and increase blood flow to the hands. These results can make work and daily tasks easier. Hand exercises can be especially helpful for people who have joint pain from arthritis or have nerve damage from overuse (carpal tunnel syndrome). These exercises can also help people who have injured a hand. Exercises Most of these hand exercises are gentle stretching and motion exercises. It is usually safe to do them often throughout the day. Warming up your hands before exercise may help to reduce stiffness. You can do this with gentle massage or by placing your hands in warm water for 10-15 minutes. It is normal to feel some stretching, pulling, tightness, or mild discomfort as you begin new exercises. This will gradually improve. Stop an exercise right away if you feel sudden, severe pain or your pain gets worse. Ask your health care provider which exercises are best for you. Knuckle bend or "claw" fist  Stand or sit with your arm, hand, and all five fingers pointed straight up. Make sure to keep your wrist straight during the exercise. Gently bend your fingers down toward your palm until the tips of your fingers are  touching the top of your palm. Keep your big knuckle straight and just bend the small knuckles in your fingers. Hold this position for __________ seconds. Straighten (extend) your fingers back to the starting position. Repeat this exercise 5-10 times with each hand. Full finger fist  Stand or sit with your arm, hand, and all five fingers pointed straight up. Make sure to keep your wrist straight during the exercise. Gently bend your fingers into your palm until the tips of your fingers are touching the middle of your palm. Hold this position for __________ seconds. Extend your fingers back to the starting position, stretching every joint fully. Repeat this exercise 5-10 times with each hand. Straight fist Stand or sit with your arm, hand, and all five fingers pointed straight up. Make sure to keep your wrist straight during the exercise. Gently bend your fingers at the big knuckle, where your fingers meet your hand, and the middle knuckle. Keep the knuckle at the tips of your fingers straight and try to touch the bottom of your palm. Hold this position for __________ seconds. Extend your fingers back to the starting position, stretching every joint fully. Repeat this exercise 5-10 times with each hand. Tabletop  Stand or sit with your arm, hand, and all five fingers pointed straight up. Make sure to keep your wrist straight during the exercise. Gently bend your fingers at the big knuckle, where your  fingers meet your hand, as far down as you can while keeping the small knuckles in your fingers straight. Think of forming a tabletop with your fingers. Hold this position for __________ seconds. Extend your fingers back to the starting position, stretching every joint fully. Repeat this exercise 5-10 times with each hand. Finger spread  Place your hand flat on a table with your palm facing down. Make sure your wrist stays straight as you do this exercise. Spread your fingers and thumb apart  from each other as far as you can until you feel a gentle stretch. Hold this position for __________ seconds. Bring your fingers and thumb tight together again. Hold this position for __________ seconds. Repeat this exercise 5-10 times with each hand. Making circles  Stand or sit with your arm, hand, and all five fingers pointed straight up. Make sure to keep your wrist straight during the exercise. Make a circle by touching the tip of your thumb to the tip of your index finger. Hold for __________ seconds. Then open your hand wide. Repeat this motion with your thumb and each finger on your hand. Repeat this exercise 5-10 times with each hand. Thumb motion  Sit with your forearm resting on a table and your wrist straight. Your thumb should be facing up toward the ceiling. Keep your fingers relaxed as you move your thumb. Lift your thumb up as high as you can toward the ceiling. Hold for __________ seconds. Bend your thumb across your palm as far as you can, reaching the tip of your thumb for the small finger (pinkie) side of your palm. Hold for __________ seconds. Repeat this exercise 5-10 times with each hand. Grip strengthening  Hold a stress ball or other soft ball in the middle of your hand. Slowly increase the pressure, squeezing the ball as much as you can without causing pain. Think of bringing the tips of your fingers into the middle of your palm. All of your finger joints should bend when doing this exercise. Hold your squeeze for __________ seconds, then relax. Repeat this exercise 5-10 times with each hand. Contact a health care provider if: Your hand pain or discomfort gets much worse when you do an exercise. Your hand pain or discomfort does not improve within 2 hours after you exercise. If you have any of these problems, stop doing these exercises right away. Do not do them again unless your health care provider says that you can. Get help right away if: You develop sudden,  severe hand pain or swelling. If this happens, stop doing these exercises right away. Do not do them again unless your health care provider says that you can. This information is not intended to replace advice given to you by your health care provider. Make sure you discuss any questions you have with your health care provider. Document Revised: 02/04/2021 Document Reviewed: 02/04/2021 Elsevier Patient Education  [place].

## 2021-07-27 LAB — COMPLETE METABOLIC PANEL WITH GFR
AG Ratio: 1.8 (calc) (ref 1.0–2.5)
ALT: 12 U/L (ref 6–29)
AST: 22 U/L (ref 10–35)
Albumin: 4.2 g/dL (ref 3.6–5.1)
Alkaline phosphatase (APISO): 70 U/L (ref 37–153)
BUN: 19 mg/dL (ref 7–25)
CO2: 28 mmol/L (ref 20–32)
Calcium: 9.4 mg/dL (ref 8.6–10.4)
Chloride: 104 mmol/L (ref 98–110)
Creat: 0.74 mg/dL (ref 0.50–1.05)
Globulin: 2.4 g/dL (calc) (ref 1.9–3.7)
Glucose, Bld: 75 mg/dL (ref 65–99)
Potassium: 4.7 mmol/L (ref 3.5–5.3)
Sodium: 139 mmol/L (ref 135–146)
Total Bilirubin: 0.4 mg/dL (ref 0.2–1.2)
Total Protein: 6.6 g/dL (ref 6.1–8.1)
eGFR: 93 mL/min/{1.73_m2} (ref >=60)

## 2021-07-27 LAB — CBC WITH DIFFERENTIAL/PLATELET
Absolute Monocytes: 442 cells/uL (ref 200–950)
Basophils Absolute: 50 cells/uL (ref 0–200)
Basophils Relative: 0.9 %
Eosinophils Absolute: 168 cells/uL (ref 15–500)
Eosinophils Relative: 3 %
HCT: 41 % (ref 35.0–45.0)
Hemoglobin: 13.8 g/dL (ref 11.7–15.5)
Lymphs Abs: 1943 cells/uL (ref 850–3900)
MCH: 31.9 pg (ref 27.0–33.0)
MCHC: 33.7 g/dL (ref 32.0–36.0)
MCV: 94.9 fL (ref 80.0–100.0)
MPV: 10.5 fL (ref 7.5–12.5)
Monocytes Relative: 7.9 %
Neutro Abs: 2996 cells/uL (ref 1500–7800)
Neutrophils Relative %: 53.5 %
Platelets: 237 10*3/uL (ref 140–400)
RBC: 4.32 10*6/uL (ref 3.80–5.10)
RDW: 13.5 % (ref 11.0–15.0)
Total Lymphocyte: 34.7 %
WBC: 5.6 10*3/uL (ref 3.8–10.8)

## 2021-08-09 DIAGNOSIS — M8589 Other specified disorders of bone density and structure, multiple sites: Secondary | ICD-10-CM | POA: Diagnosis not present

## 2021-08-16 DIAGNOSIS — E7800 Pure hypercholesterolemia, unspecified: Secondary | ICD-10-CM | POA: Diagnosis not present

## 2021-08-16 DIAGNOSIS — F419 Anxiety disorder, unspecified: Secondary | ICD-10-CM | POA: Diagnosis not present

## 2021-08-16 DIAGNOSIS — M069 Rheumatoid arthritis, unspecified: Secondary | ICD-10-CM | POA: Diagnosis not present

## 2021-08-16 DIAGNOSIS — Z0000 Encounter for general adult medical examination without abnormal findings: Secondary | ICD-10-CM | POA: Diagnosis not present

## 2021-08-16 DIAGNOSIS — R350 Frequency of micturition: Secondary | ICD-10-CM | POA: Diagnosis not present

## 2021-08-19 ENCOUNTER — Other Ambulatory Visit (HOSPITAL_BASED_OUTPATIENT_CLINIC_OR_DEPARTMENT_OTHER): Payer: Self-pay

## 2021-08-20 ENCOUNTER — Other Ambulatory Visit (HOSPITAL_COMMUNITY): Payer: Self-pay

## 2021-08-20 MED ORDER — ALPRAZOLAM 0.25 MG PO TABS
ORAL_TABLET | ORAL | 0 refills | Status: DC
  Filled 2021-08-20: qty 30, 15d supply, fill #0

## 2021-08-23 ENCOUNTER — Other Ambulatory Visit (HOSPITAL_BASED_OUTPATIENT_CLINIC_OR_DEPARTMENT_OTHER): Payer: Self-pay

## 2021-08-25 ENCOUNTER — Other Ambulatory Visit (HOSPITAL_BASED_OUTPATIENT_CLINIC_OR_DEPARTMENT_OTHER): Payer: Self-pay

## 2021-08-25 MED ORDER — ALPRAZOLAM 0.25 MG PO TABS
ORAL_TABLET | ORAL | 0 refills | Status: DC
  Filled 2021-08-25: qty 30, 30d supply, fill #0

## 2021-09-11 DIAGNOSIS — H524 Presbyopia: Secondary | ICD-10-CM | POA: Diagnosis not present

## 2021-10-12 ENCOUNTER — Telehealth: Payer: Self-pay | Admitting: Rheumatology

## 2021-10-12 NOTE — Telephone Encounter (Signed)
Patient called the office with questions about when her labs were due.

## 2021-10-12 NOTE — Telephone Encounter (Signed)
Patient advised she is due for labs at end of this month.

## 2021-10-12 NOTE — Telephone Encounter (Signed)
Attempted to contact the patient and unable to leave a message, voicemail is fill.

## 2021-10-20 ENCOUNTER — Other Ambulatory Visit: Payer: Self-pay | Admitting: Rheumatology

## 2021-10-20 ENCOUNTER — Other Ambulatory Visit (HOSPITAL_BASED_OUTPATIENT_CLINIC_OR_DEPARTMENT_OTHER): Payer: Self-pay

## 2021-10-20 MED ORDER — METHOTREXATE 2.5 MG PO TABS
12.5000 mg | ORAL_TABLET | ORAL | 0 refills | Status: DC
  Filled 2021-10-20: qty 60, 84d supply, fill #0

## 2021-10-20 NOTE — Telephone Encounter (Signed)
Next Visit: 12/27/2021  Last Visit: 07/26/2021  Last Fill: 07/26/2021  DX: Rheumatoid arthritis involving multiple sites with positive rheumatoid factor   Current Dose per office note 07/26/2021: Methotrexate 0.6 ml every 7 days. Per her request we switched her to methotrexate 5 tablets p.o. weekly along with folic acid  Labs: 3/74/4514 CBC and CMP WNL  Okay to refill MTX?

## 2021-10-22 ENCOUNTER — Other Ambulatory Visit (HOSPITAL_BASED_OUTPATIENT_CLINIC_OR_DEPARTMENT_OTHER): Payer: Self-pay

## 2021-10-26 ENCOUNTER — Other Ambulatory Visit: Payer: Self-pay | Admitting: *Deleted

## 2021-10-26 ENCOUNTER — Telehealth: Payer: Self-pay | Admitting: Rheumatology

## 2021-10-26 DIAGNOSIS — Z79899 Other long term (current) drug therapy: Secondary | ICD-10-CM

## 2021-10-26 NOTE — Telephone Encounter (Signed)
Lab Orders released and patient advised.

## 2021-10-26 NOTE — Telephone Encounter (Signed)
Patient called the office requesting that her lab orders be sent to [facility] on [address]. Patient states that she would like them sent in today if possible because she is off work and would like a return call when they have been sent.

## 2021-11-02 ENCOUNTER — Telehealth: Payer: Self-pay | Admitting: Rheumatology

## 2021-11-02 NOTE — Telephone Encounter (Signed)
Attempted to contact the patient and left message for patient to call the office.  

## 2021-11-02 NOTE — Telephone Encounter (Signed)
Patient called the office stating she is a [employer] employee and is required to have labs drawn yearly and wants to know if she would be able to have them drawn here when she gets her labs drawn.

## 2021-11-03 DIAGNOSIS — Z79899 Other long term (current) drug therapy: Secondary | ICD-10-CM | POA: Diagnosis not present

## 2021-11-03 NOTE — Telephone Encounter (Signed)
Patient states she has an order to have a TB gold drawn for work. Patient would like to know if we can add this to her lab work. Patient states she tried having her labs done at [facility] yesterday and it was "a mad house". Patient would like to have labs drawn in our office today. Can we add the TB Gold?

## 2021-11-03 NOTE — Telephone Encounter (Signed)
Patient called back to the office and states she went by [facility] today and was able to have her labs drawn including her standing labs for [place].

## 2021-11-04 LAB — CBC WITH DIFFERENTIAL/PLATELET
Basophils Absolute: 0.1 10*3/uL (ref 0.0–0.2)
Basos: 1 %
EOS (ABSOLUTE): 0.2 10*3/uL (ref 0.0–0.4)
Eos: 4 %
Hematocrit: 41.3 % (ref 34.0–46.6)
Hemoglobin: 14.4 g/dL (ref 11.1–15.9)
Immature Grans (Abs): 0 10*3/uL (ref 0.0–0.1)
Immature Granulocytes: 0 %
Lymphocytes Absolute: 2.3 10*3/uL (ref 0.7–3.1)
Lymphs: 40 %
MCH: 32.4 pg (ref 26.6–33.0)
MCHC: 34.9 g/dL (ref 31.5–35.7)
MCV: 93 fL (ref 79–97)
Monocytes Absolute: 0.3 10*3/uL (ref 0.1–0.9)
Monocytes: 5 %
Neutrophils Absolute: 2.9 10*3/uL (ref 1.4–7.0)
Neutrophils: 50 %
Platelets: 242 10*3/uL (ref 150–450)
RBC: 4.44 x10E6/uL (ref 3.77–5.28)
RDW: 12.8 % (ref 11.7–15.4)
WBC: 5.8 10*3/uL (ref 3.4–10.8)

## 2021-11-04 LAB — CMP14+EGFR
ALT: 26 IU/L (ref 0–32)
AST: 40 IU/L (ref 0–40)
Albumin/Globulin Ratio: 1.8 (ref 1.2–2.2)
Albumin: 4.4 g/dL (ref 3.8–4.9)
Alkaline Phosphatase: 82 IU/L (ref 44–121)
BUN/Creatinine Ratio: 22 (ref 12–28)
BUN: 18 mg/dL (ref 8–27)
Bilirubin Total: 0.5 mg/dL (ref 0.0–1.2)
CO2: 26 mmol/L (ref 20–29)
Calcium: 9.6 mg/dL (ref 8.7–10.3)
Chloride: 102 mmol/L (ref 96–106)
Creatinine, Ser: 0.83 mg/dL (ref 0.57–1.00)
Globulin, Total: 2.5 g/dL (ref 1.5–4.5)
Glucose: 91 mg/dL (ref 70–99)
Potassium: 4.5 mmol/L (ref 3.5–5.2)
Sodium: 140 mmol/L (ref 134–144)
Total Protein: 6.9 g/dL (ref 6.0–8.5)
eGFR: 81 mL/min/{1.73_m2} (ref >59)

## 2021-11-04 NOTE — Progress Notes (Signed)
CBC and CMP WNL

## 2021-11-16 ENCOUNTER — Other Ambulatory Visit (HOSPITAL_BASED_OUTPATIENT_CLINIC_OR_DEPARTMENT_OTHER): Payer: Self-pay

## 2021-12-14 ENCOUNTER — Other Ambulatory Visit (HOSPITAL_BASED_OUTPATIENT_CLINIC_OR_DEPARTMENT_OTHER): Payer: Self-pay

## 2021-12-14 ENCOUNTER — Other Ambulatory Visit: Payer: Self-pay | Admitting: Rheumatology

## 2021-12-14 MED ORDER — FOLIC ACID 1 MG PO TABS
1.0000 mg | ORAL_TABLET | Freq: Every day | ORAL | 3 refills | Status: DC
  Filled 2021-12-14: qty 90, 90d supply, fill #0
  Filled 2022-03-25: qty 90, 90d supply, fill #1
  Filled 2022-07-20: qty 90, 90d supply, fill #2
  Filled 2022-11-25: qty 90, 90d supply, fill #3

## 2021-12-14 NOTE — Telephone Encounter (Signed)
Next Visit: 12/27/2021   Last Visit: 07/26/2021   Last Fill: 09/29/2020  DX: Rheumatoid arthritis involving multiple sites with positive rheumatoid factor    Current Dose per office note 04/14/1833: folic acid 1 mg 1 tablet daily.   Okay to refill Folic Acid?

## 2021-12-17 ENCOUNTER — Other Ambulatory Visit (HOSPITAL_BASED_OUTPATIENT_CLINIC_OR_DEPARTMENT_OTHER): Payer: Self-pay

## 2021-12-20 ENCOUNTER — Other Ambulatory Visit (HOSPITAL_BASED_OUTPATIENT_CLINIC_OR_DEPARTMENT_OTHER): Payer: Self-pay

## 2021-12-20 DIAGNOSIS — L259 Unspecified contact dermatitis, unspecified cause: Secondary | ICD-10-CM | POA: Diagnosis not present

## 2021-12-20 MED ORDER — TRIAMCINOLONE ACETONIDE 0.5 % EX CREA
TOPICAL_CREAM | CUTANEOUS | 0 refills | Status: DC
  Filled 2021-12-20: qty 15, 5d supply, fill #0
  Filled 2021-12-20: qty 30, 10d supply, fill #0

## 2021-12-21 NOTE — Progress Notes (Signed)
Office Visit Note  Patient: Gina Mitchell             Date of Birth: 02-Aug-1961           MRN: [id number]             PCP: Shirline Frees, MD Referring: Shirline Frees, MD Visit Date: 01/04/2022 Occupation: @GUAROCC @  Subjective:  Medication management  History of Present Illness: Gina Mitchell is a 61 y.o. female with history of 0 positive rheumatoid arthritis.  She states she is doing fairly well on methotrexate 5 tablets p.o. weekly.  She has some stiffness in her hands.  She has not had any eye flares and has an appointment coming up with Dr. Manuella Ghazi.  She continues to have some discomfort in her hands and her knees.  She states increased stress causes increased pain.  She takes ibuprofen occasionally.  Activities of Daily Living:  Patient reports morning stiffness for 0 minutes.   Patient Reports nocturnal pain.  Difficulty dressing/grooming: Denies Difficulty climbing stairs: Denies Difficulty getting out of chair: Denies Difficulty using hands for taps, buttons, cutlery, and/or writing: Denies  Review of Systems  Constitutional:  Negative for fatigue.  HENT:  Positive for mouth dryness. Negative for mouth sores and nose dryness.   Eyes:  Positive for dryness. Negative for pain and itching.  Respiratory:  Negative for shortness of breath and difficulty breathing.   Cardiovascular:  Negative for chest pain and palpitations.  Gastrointestinal:  Positive for constipation. Negative for blood in stool and diarrhea.  Endocrine: Negative for increased urination.  Genitourinary:  Negative for difficulty urinating.  Musculoskeletal:  Positive for joint pain, joint pain, myalgias and myalgias. Negative for joint swelling, morning stiffness and muscle tenderness.  Skin:  Negative for color change, rash and redness.  Allergic/Immunologic: Negative for susceptible to infections.  Neurological:  Positive for numbness and headaches. Negative for dizziness, memory loss and weakness.   Hematological:  Negative for bruising/bleeding tendency.  Psychiatric/Behavioral:  Negative for confusion.    PMFS History:  Patient Active Problem List   Diagnosis Date Noted   Primary osteoarthritis of both hands 09/08/2017   Primary osteoarthritis of both feet 09/08/2017   Primary osteoarthritis of both knees 09/08/2017   History of gastroesophageal reflux (GERD) 09/08/2017   Dyslipidemia 09/08/2017   Osteopenia of multiple sites 09/08/2017   Right ankle swelling 06/26/2017   Rheumatoid arthritis ([place]) 06/26/2017   DDD (degenerative disc disease), cervical 09/13/2016   Localized primary carpometacarpal osteoarthritis, right 08/26/2016   Rotator cuff dysfunction, right 08/26/2016   Metatarsalgia with a left third MTP synovitis 08/01/2016   Metatarsal stress fracture of left foot 04/19/2016   Abnormal EKG 11/06/2014   Chest pain 06/10/2014   Plantar fasciitis, bilateral 11/25/2013    Past Medical History:  Diagnosis Date   Allergic rhinitis    Esophageal reflux    Incomplete right bundle branch block    Rheumatoid arthritis ([place])    Situational anxiety     Family History  Problem Relation Age of Onset   Prostate cancer Father    Arrhythmia Father    Breast cancer Sister    Hypertension Mother    Past Surgical History:  Procedure Laterality Date   BREAST EXCISIONAL BIOPSY     Breast mass removal     Social History   Social History Narrative   Not on file   Immunization History  Administered Date(s) Administered   PFIZER(Purple Top)SARS-COV-2 Vaccination 11/25/2019, 12/16/2019, 11/12/2020  Objective: Vital Signs: BP (!) 141/85 (BP Location: Right Arm, Patient Position: Sitting, Cuff Size: Normal)    Pulse 69    Ht 5\' 2"  (1.575 m)    Wt 124 lb 12.8 oz (56.6 kg)    BMI 22.83 kg/m    Physical Exam Vitals and nursing note reviewed.  Constitutional:      Appearance: She is well-developed.  HENT:     Head: Normocephalic and atraumatic.  Eyes:      Conjunctiva/sclera: Conjunctivae normal.  Cardiovascular:     Rate and Rhythm: Normal rate and regular rhythm.     Heart sounds: Normal heart sounds.  Pulmonary:     Effort: Pulmonary effort is normal.     Breath sounds: Normal breath sounds.  Abdominal:     General: Bowel sounds are normal.     Palpations: Abdomen is soft.  Musculoskeletal:     Cervical back: Normal range of motion.  Lymphadenopathy:     Cervical: No cervical adenopathy.  Skin:    General: Skin is warm and dry.     Capillary Refill: Capillary refill takes less than 2 seconds.  Neurological:     Mental Status: She is alert and oriented to person, place, and time.  Psychiatric:        Behavior: Behavior normal.     Musculoskeletal Exam: C-spine was in good range of motion.  Shoulder joints, elbow joints, wrist joints, MCPs PIPs and DIPs with good range of motion.  She had bilateral PIP and DIP thickening.  Hip joints and knee joints in good range of motion without any warmth swelling or effusion.  She had mild tenderness over left trochanteric bursa.  There was no tenderness over ankles or MTPs  CDAI Exam: CDAI Score: 0.6  Patient Global: 3 mm; Provider Global: 3 mm Swollen: 0 ; Tender: 0  Joint Exam 01/04/2022   No joint exam has been documented for this visit   There is currently no information documented on the homunculus. Go to the Rheumatology activity and complete the homunculus joint exam.  Investigation: No additional findings.  Imaging: No results found.  Recent Labs: Lab Results  Component Value Date   WBC 5.8 11/03/2021   HGB 14.4 11/03/2021   PLT 242 11/03/2021   NA 140 11/03/2021   K 4.5 11/03/2021   CL 102 11/03/2021   CO2 26 11/03/2021   GLUCOSE 91 11/03/2021   BUN 18 11/03/2021   CREATININE 0.83 11/03/2021   BILITOT 0.5 11/03/2021   ALKPHOS 82 11/03/2021   AST 40 11/03/2021   ALT 26 11/03/2021   PROT 6.9 11/03/2021   ALBUMIN 4.4 11/03/2021   CALCIUM 9.6 11/03/2021   GFRAA 92  03/01/2021   QFTBGOLD NEGATIVE 08/14/2017    Speciality Comments: No specialty comments available.  Procedures:  No procedures performed Allergies: Crab [shellfish allergy]   Assessment / Plan:     Visit Diagnoses: Rheumatoid arthritis involving multiple sites with positive rheumatoid factor (HCC)-she had no synovitis on examination.  She continues to have some stiffness in her hands.  I believe it is due to underlying osteoarthritis.  She has been tolerating methotrexate well.  She takes ibuprofen occasionally.  She states that  increased stress causes increased pain.  High risk medication use - Methotrexate 5 tablets by mouth every 7 days and folic acid 1 mg 1 tablet daily.  Labs obtained on November 03, 2021 were within normal limits.  Information regarding immunization was placed in the AVS.  She was also  advised to hold methotrexate in case she develops an infection.  Primary osteoarthritis of both hands-she has bilateral CMC PIP and DIP thickening.  A handout on hand exercises was given.  Joint protection was discussed.  Trochanteric bursitis, left hip-she has been having off-and-on discomfort in the left trochanteric area.  IT band stretching handout was given.  Chronic pain of both knees - X-ray showed mild osteoarthritis and mild chondromalacia patella.  She is off-and-on discomfort in her knee joints.  No warmth swelling or effusion was noted.  Primary osteoarthritis of both knees-lower extremity muscle strengthening exercises were discussed.  Primary osteoarthritis of both feet-proper fitting shoes were discussed.  DDD (degenerative disc disease), cervical-she had good range of motion of her cervical spine.  Primary insomnia-she is under a lot of stress due to her husband's health and she also works night shift.  She has been using Xanax on as needed basis.  She wants to try magnesium and melatonin.  Other fatigue-due to stress and work schedule.  Osteopenia of multiple sites  - DEXA is done by her PCP.  Retinal edema-she has an appointment coming up with Dr. Manuella Ghazi.  History of gastroesophageal reflux (GERD)  Dyslipidemia-dietary modifications were discussed.  Orders: No orders of the defined types were placed in this encounter.  No orders of the defined types were placed in this encounter.   Follow-Up Instructions: Return in about 5 months (around 06/06/2022) for Rheumatoid arthritis.   Bo Merino, MD  Note - This record has been created using Editor, commissioning.  Chart creation errors have been sought, but may not always  have been located. Such creation errors do not reflect on  the standard of medical care.

## 2021-12-23 ENCOUNTER — Other Ambulatory Visit (HOSPITAL_BASED_OUTPATIENT_CLINIC_OR_DEPARTMENT_OTHER): Payer: Self-pay

## 2021-12-27 ENCOUNTER — Ambulatory Visit: Payer: 59 | Admitting: Rheumatology

## 2022-01-04 ENCOUNTER — Other Ambulatory Visit: Payer: Self-pay

## 2022-01-04 ENCOUNTER — Ambulatory Visit: Payer: 59 | Admitting: Rheumatology

## 2022-01-04 ENCOUNTER — Encounter: Payer: Self-pay | Admitting: Rheumatology

## 2022-01-04 VITALS — BP 141/85 | HR 69 | Ht 62.0 in | Wt 124.8 lb

## 2022-01-04 DIAGNOSIS — M19071 Primary osteoarthritis, right ankle and foot: Secondary | ICD-10-CM | POA: Diagnosis not present

## 2022-01-04 DIAGNOSIS — M25561 Pain in right knee: Secondary | ICD-10-CM

## 2022-01-04 DIAGNOSIS — H3581 Retinal edema: Secondary | ICD-10-CM

## 2022-01-04 DIAGNOSIS — M8589 Other specified disorders of bone density and structure, multiple sites: Secondary | ICD-10-CM

## 2022-01-04 DIAGNOSIS — F5101 Primary insomnia: Secondary | ICD-10-CM | POA: Diagnosis not present

## 2022-01-04 DIAGNOSIS — Z8719 Personal history of other diseases of the digestive system: Secondary | ICD-10-CM

## 2022-01-04 DIAGNOSIS — M7062 Trochanteric bursitis, left hip: Secondary | ICD-10-CM | POA: Diagnosis not present

## 2022-01-04 DIAGNOSIS — M19041 Primary osteoarthritis, right hand: Secondary | ICD-10-CM

## 2022-01-04 DIAGNOSIS — M0579 Rheumatoid arthritis with rheumatoid factor of multiple sites without organ or systems involvement: Secondary | ICD-10-CM | POA: Diagnosis not present

## 2022-01-04 DIAGNOSIS — Z79899 Other long term (current) drug therapy: Secondary | ICD-10-CM | POA: Diagnosis not present

## 2022-01-04 DIAGNOSIS — M19042 Primary osteoarthritis, left hand: Secondary | ICD-10-CM

## 2022-01-04 DIAGNOSIS — M5030 Other cervical disc degeneration, unspecified cervical region: Secondary | ICD-10-CM

## 2022-01-04 DIAGNOSIS — G8929 Other chronic pain: Secondary | ICD-10-CM

## 2022-01-04 DIAGNOSIS — R5383 Other fatigue: Secondary | ICD-10-CM

## 2022-01-04 DIAGNOSIS — M170 Bilateral primary osteoarthritis of knee: Secondary | ICD-10-CM

## 2022-01-04 DIAGNOSIS — M25562 Pain in left knee: Secondary | ICD-10-CM

## 2022-01-04 DIAGNOSIS — E785 Hyperlipidemia, unspecified: Secondary | ICD-10-CM

## 2022-01-04 DIAGNOSIS — M19072 Primary osteoarthritis, left ankle and foot: Secondary | ICD-10-CM

## 2022-01-04 NOTE — Patient Instructions (Addendum)
Standing Labs We placed an order today for your standing lab work.   Please have your standing labs drawn in April and every 3 months  If possible, please have your labs drawn 2 weeks prior to your appointment so that the provider can discuss your results at your appointment.  Please note that you may see your imaging and lab results in [place] before we have reviewed them. We may be awaiting multiple results to interpret others before contacting you. Please allow our office up to 72 hours to thoroughly review all of the results before contacting the office for clarification of your results.  We have open lab daily: Monday through Thursday from 1:30-4:30 PM and Friday from 1:30-4:00 PM at the office of Dr. Bo Merino, [facility].   Please be advised, all patients with office appointments requiring lab work will take precedent over walk-in lab work.  If possible, please come for your lab work on Monday and Friday afternoons, as you may experience shorter wait times. The office is located at [address] [place], [place], [place] [address] No appointment is necessary.   Labs are drawn by [facility]. Please bring your co-pay at the time of your lab draw.  You may receive a bill from [facility] for your lab work.  Please note if you are on Hydroxychloroquine and and an order has been placed for a Hydroxychloroquine level, you will need to have it drawn 4 hours or more after your last dose.  If you wish to have your labs drawn at another location, please call the office 24 hours in advance to send orders.  If you have any questions regarding directions or hours of operation,  please call [phone].   As a reminder, please drink plenty of water prior to coming for your lab work. Thanks!   Vaccines You are taking a medication(s) that can suppress your immune system.  The following immunizations are recommended: Flu annually Covid-19  Td/Tdap (tetanus, diphtheria, pertussis)  every 10 years Pneumonia (Prevnar 15 then Pneumovax 23 at least 1 year apart.  Alternatively, can take Prevnar 20 without needing additional dose) Shingrix: 2 doses from 4 weeks to 6 months apart  Please check with your PCP to make sure you are up to date.  If you have signs or symptoms of an infection or start antibiotics: First, call your PCP for workup of your infection. Hold your medication through the infection, until you complete your antibiotics, and until symptoms resolve if you take the following: Injectable medication (Actemra, Benlysta, Cimzia, Cosentyx, Enbrel, Humira, Kevzara, Orencia, Remicade, Simponi, Stelara, Taltz, Tremfya) Methotrexate Leflunomide (Arava) Mycophenolate (Cellcept) Roma Kayser, or Rinvoq  Hand Exercises Hand exercises can be helpful for almost anyone. These exercises can strengthen the hands, improve flexibility and movement, and increase blood flow to the hands. These results can make work and daily tasks easier. Hand exercises can be especially helpful for people who have joint pain from arthritis or have nerve damage from overuse (carpal tunnel syndrome). These exercises can also help people who have injured a hand. Exercises Most of these hand exercises are gentle stretching and motion exercises. It is usually safe to do them often throughout the day. Warming up your hands before exercise may help to reduce stiffness. You can do this with gentle massage or by placing your hands in warm water for 10-15 minutes. It is normal to feel some stretching, pulling, tightness, or mild discomfort as you begin new exercises. This will gradually improve. Stop  an exercise right away if you feel sudden, severe pain or your pain gets worse. Ask your health care provider which exercises are best for you. Knuckle bend or "claw" fist  Stand or sit with your arm, hand, and all five fingers pointed straight up. Make sure to keep your wrist straight during the  exercise. Gently bend your fingers down toward your palm until the tips of your fingers are touching the top of your palm. Keep your big knuckle straight and just bend the small knuckles in your fingers. Hold this position for __________ seconds. Straighten (extend) your fingers back to the starting position. Repeat this exercise 5-10 times with each hand. Full finger fist  Stand or sit with your arm, hand, and all five fingers pointed straight up. Make sure to keep your wrist straight during the exercise. Gently bend your fingers into your palm until the tips of your fingers are touching the middle of your palm. Hold this position for __________ seconds. Extend your fingers back to the starting position, stretching every joint fully. Repeat this exercise 5-10 times with each hand. Straight fist Stand or sit with your arm, hand, and all five fingers pointed straight up. Make sure to keep your wrist straight during the exercise. Gently bend your fingers at the big knuckle, where your fingers meet your hand, and the middle knuckle. Keep the knuckle at the tips of your fingers straight and try to touch the bottom of your palm. Hold this position for __________ seconds. Extend your fingers back to the starting position, stretching every joint fully. Repeat this exercise 5-10 times with each hand. Tabletop  Stand or sit with your arm, hand, and all five fingers pointed straight up. Make sure to keep your wrist straight during the exercise. Gently bend your fingers at the big knuckle, where your fingers meet your hand, as far down as you can while keeping the small knuckles in your fingers straight. Think of forming a tabletop with your fingers. Hold this position for __________ seconds. Extend your fingers back to the starting position, stretching every joint fully. Repeat this exercise 5-10 times with each hand. Finger spread  Place your hand flat on a table with your palm facing down. Make  sure your wrist stays straight as you do this exercise. Spread your fingers and thumb apart from each other as far as you can until you feel a gentle stretch. Hold this position for __________ seconds. Bring your fingers and thumb tight together again. Hold this position for __________ seconds. Repeat this exercise 5-10 times with each hand. Making circles  Stand or sit with your arm, hand, and all five fingers pointed straight up. Make sure to keep your wrist straight during the exercise. Make a circle by touching the tip of your thumb to the tip of your index finger. Hold for __________ seconds. Then open your hand wide. Repeat this motion with your thumb and each finger on your hand. Repeat this exercise 5-10 times with each hand. Thumb motion  Sit with your forearm resting on a table and your wrist straight. Your thumb should be facing up toward the ceiling. Keep your fingers relaxed as you move your thumb. Lift your thumb up as high as you can toward the ceiling. Hold for __________ seconds. Bend your thumb across your palm as far as you can, reaching the tip of your thumb for the small finger (pinkie) side of your palm. Hold for __________ seconds. Repeat this exercise 5-10 times with  each hand. Grip strengthening  Hold a stress ball or other soft ball in the middle of your hand. Slowly increase the pressure, squeezing the ball as much as you can without causing pain. Think of bringing the tips of your fingers into the middle of your palm. All of your finger joints should bend when doing this exercise. Hold your squeeze for __________ seconds, then relax. Repeat this exercise 5-10 times with each hand. Contact a health care provider if: Your hand pain or discomfort gets much worse when you do an exercise. Your hand pain or discomfort does not improve within 2 hours after you exercise. If you have any of these problems, stop doing these exercises right away. Do not do them again unless  your health care provider says that you can. Get help right away if: You develop sudden, severe hand pain or swelling. If this happens, stop doing these exercises right away. Do not do them again unless your health care provider says that you can. This information is not intended to replace advice given to you by your health care provider. Make sure you discuss any questions you have with your health care provider. Document Revised: 02/04/2021 Document Reviewed: 02/04/2021 Elsevier Patient Education  [facility] Ask your health care provider which exercises are safe for you. Do exercises exactly as told by your health care provider and adjust them as directed. It is normal to feel mild stretching, pulling, tightness, or discomfort as you do these exercises. Stop right away if you feel sudden pain or your pain gets significantly worse. Do not begin these exercises until told by your health care provider. Stretching and range-of-motion exercises These exercises warm up your muscles and joints and improve the movement and flexibility of your hip and pelvis. Quadriceps stretch, prone  Lie on your abdomen (prone position) on a firm surface, such as a bed or padded floor. Bend your left / right knee and reach back to hold your ankle or pant leg. If you cannot reach your ankle or pant leg, loop a belt around your foot and grab the belt instead. Gently pull your heel toward your buttocks. Your knee should not slide out to the side. You should feel a stretch in the front of your thigh and knee (quadriceps). Hold this position for __________ seconds. Repeat __________ times. Complete this exercise __________ times a day. Iliotibial band stretch An iliotibial band is a strong band of muscle tissue that runs from the outer side of your hip to the outer side of your thigh and knee. Lie on your side with your left / right leg in the top position. Bend both of your knees  and grab your left / right ankle. Stretch out your bottom arm to help you balance. Slowly bring your top knee back so your thigh goes behind your trunk. Slowly lower your top leg toward the floor until you feel a gentle stretch on the outside of your left / right hip and thigh. If you do not feel a stretch and your knee will not fall farther, place the heel of your other foot on top of your knee and pull your knee down toward the floor with your foot. Hold this position for __________ seconds. Repeat __________ times. Complete this exercise __________ times a day. Strengthening exercises These exercises build strength and endurance in your hip and pelvis. Endurance is the ability to use your muscles for a long time, even after they get tired.  Straight leg raises, side-lying This exercise strengthens the muscles that rotate the leg at the hip and move it away from your body (hip abductors). Lie on your side with your left / right leg in the top position. Lie so your head, shoulder, hip, and knee line up. You may bend your bottom knee to help you balance. Roll your hips slightly forward so your hips are stacked directly over each other and your left / right knee is facing forward. Tense the muscles in your outer thigh and lift your top leg 4-6 inches (10-15 cm). Hold this position for __________ seconds. Slowly lower your leg to return to the starting position. Let your muscles relax completely before doing another repetition. Repeat __________ times. Complete this exercise __________ times a day. Leg raises, prone This exercise strengthens the muscles that move the hips backward (hip extensors). Lie on your abdomen (prone position) on your bed or a firm surface. You can put a pillow under your hips if that is more comfortable for your lower back. Bend your left / right knee so your foot is straight up in the air. Squeeze your buttocks muscles and lift your left / right thigh off the bed. Do not let  your back arch. Tense your thigh muscle as hard as you can without increasing any knee pain. Hold this position for __________ seconds. Slowly lower your leg to return to the starting position and allow it to relax completely. Repeat __________ times. Complete this exercise __________ times a day. Hip hike Stand sideways on a bottom step. Stand on your left / right leg with your other foot unsupported next to the step. You can hold on to a railing or wall for balance if needed. Keep your knees straight and your torso square. Then lift your left / right hip up toward the ceiling. Slowly let your left / right hip lower toward the floor, past the starting position. Your foot should get closer to the floor. Do not lean or bend your knees. Repeat __________ times. Complete this exercise __________ times a day. This information is not intended to replace advice given to you by your health care provider. Make sure you discuss any questions you have with your health care provider. Document Revised: 12/25/2019 Document Reviewed: 12/25/2019 Elsevier Patient Education  [place].

## 2022-01-10 ENCOUNTER — Other Ambulatory Visit: Payer: Self-pay | Admitting: Physician Assistant

## 2022-01-10 ENCOUNTER — Other Ambulatory Visit (HOSPITAL_BASED_OUTPATIENT_CLINIC_OR_DEPARTMENT_OTHER): Payer: Self-pay

## 2022-01-10 ENCOUNTER — Ambulatory Visit
Admission: RE | Admit: 2022-01-10 | Discharge: 2022-01-10 | Disposition: A | Discharge status: Home / Self Care | Payer: 59 | Source: Ambulatory Visit | Attending: Family Medicine | Admitting: Family Medicine

## 2022-01-10 ENCOUNTER — Other Ambulatory Visit: Payer: Self-pay | Admitting: Family Medicine

## 2022-01-10 DIAGNOSIS — M79662 Pain in left lower leg: Secondary | ICD-10-CM | POA: Diagnosis not present

## 2022-01-10 DIAGNOSIS — M7989 Other specified soft tissue disorders: Secondary | ICD-10-CM | POA: Diagnosis not present

## 2022-01-10 MED ORDER — METHOTREXATE 2.5 MG PO TABS
12.5000 mg | ORAL_TABLET | ORAL | 0 refills | Status: DC
  Filled 2022-01-10: qty 60, 84d supply, fill #0

## 2022-01-10 NOTE — Telephone Encounter (Signed)
Next Visit: 06/14/2022 ? ?Last Visit: 01/04/2022 ? ?Last Fill: 10/20/2021 ? ?DX: Rheumatoid arthritis involving multiple sites with positive rheumatoid factor  ? ?Current Dose per office note 01/04/2022: Methotrexate 5 tablets by mouth every 7 days  ? ?Labs: 11/03/2021 CBC and CMP WNL  ? ?Okay to refill MTX?  ?

## 2022-01-13 ENCOUNTER — Ambulatory Visit (INDEPENDENT_AMBULATORY_CARE_PROVIDER_SITE_OTHER): Payer: 59 | Admitting: Rheumatology

## 2022-01-13 ENCOUNTER — Other Ambulatory Visit (HOSPITAL_BASED_OUTPATIENT_CLINIC_OR_DEPARTMENT_OTHER): Payer: Self-pay

## 2022-01-13 ENCOUNTER — Other Ambulatory Visit: Payer: Self-pay

## 2022-01-13 ENCOUNTER — Encounter: Payer: Self-pay | Admitting: Rheumatology

## 2022-01-13 VITALS — BP 138/89 | HR 61 | Ht 62.0 in | Wt 126.2 lb

## 2022-01-13 DIAGNOSIS — M19041 Primary osteoarthritis, right hand: Secondary | ICD-10-CM

## 2022-01-13 DIAGNOSIS — M25562 Pain in left knee: Secondary | ICD-10-CM | POA: Diagnosis not present

## 2022-01-13 DIAGNOSIS — Z79899 Other long term (current) drug therapy: Secondary | ICD-10-CM

## 2022-01-13 DIAGNOSIS — H3581 Retinal edema: Secondary | ICD-10-CM

## 2022-01-13 DIAGNOSIS — M19071 Primary osteoarthritis, right ankle and foot: Secondary | ICD-10-CM

## 2022-01-13 DIAGNOSIS — R5383 Other fatigue: Secondary | ICD-10-CM

## 2022-01-13 DIAGNOSIS — F5101 Primary insomnia: Secondary | ICD-10-CM

## 2022-01-13 DIAGNOSIS — M19072 Primary osteoarthritis, left ankle and foot: Secondary | ICD-10-CM

## 2022-01-13 DIAGNOSIS — M170 Bilateral primary osteoarthritis of knee: Secondary | ICD-10-CM

## 2022-01-13 DIAGNOSIS — M0579 Rheumatoid arthritis with rheumatoid factor of multiple sites without organ or systems involvement: Secondary | ICD-10-CM

## 2022-01-13 DIAGNOSIS — M19042 Primary osteoarthritis, left hand: Secondary | ICD-10-CM

## 2022-01-13 DIAGNOSIS — M8589 Other specified disorders of bone density and structure, multiple sites: Secondary | ICD-10-CM

## 2022-01-13 DIAGNOSIS — E785 Hyperlipidemia, unspecified: Secondary | ICD-10-CM

## 2022-01-13 DIAGNOSIS — M5030 Other cervical disc degeneration, unspecified cervical region: Secondary | ICD-10-CM

## 2022-01-13 DIAGNOSIS — Z8719 Personal history of other diseases of the digestive system: Secondary | ICD-10-CM

## 2022-01-13 MED ORDER — DICLOFENAC SODIUM 3 % EX GEL
CUTANEOUS | 2 refills | Status: DC
  Filled 2022-01-13: qty 100, 10d supply, fill #0

## 2022-01-13 NOTE — Progress Notes (Signed)
? ?Office Visit Note ? ?Patient: Gina Mitchell             ?Date of Birth: September 25, 1961           ?MRN: [id number]             ?PCP: Shirline Frees, MD ?Referring: Shirline Frees, MD ?Visit Date: 01/13/2022 ?Occupation: '@GUAROCC'$ @ ? ?Subjective:  ?Left knee pain ? ?History of Present Illness: Gina Mitchell is a 61 y.o. female with history of rheumatoid arthritis and osteoarthritis.  She states she was doing well until last Sunday.  She states on Saturday she went for spinning at the gym.  She states on Sunday she started having severe pain and discomfort in her left knee and her left leg.  She was concerned about possible DVT.  She went to see her PCP who examined her and schedule Doppler study which was negative for DVT.  She started taking Motrin and the pain has eased off some.  She still continues to have pain in her left knee joint.  None of the other joints are painful.  She is on the same dose of methotrexate and has not reduced the dose. ? ?Activities of Daily Living:  ?Patient reports morning stiffness for 0 minutes.   ?Patient Denies nocturnal pain.  ?Difficulty dressing/grooming: Denies ?Difficulty climbing stairs: Denies ?Difficulty getting out of chair: Denies ?Difficulty using hands for taps, buttons, cutlery, and/or writing: Denies ? ?Review of Systems  ?Constitutional:  Negative for fatigue.  ?HENT:  Negative for mouth sores, mouth dryness and nose dryness.   ?Eyes:  Positive for dryness. Negative for pain and itching.  ?Respiratory:  Negative for shortness of breath and difficulty breathing.   ?Cardiovascular:  Negative for chest pain and palpitations.  ?Gastrointestinal:  Positive for constipation. Negative for blood in stool and diarrhea.  ?Endocrine: Negative for increased urination.  ?Genitourinary:  Negative for difficulty urinating.  ?Musculoskeletal:  Positive for joint pain, joint pain and muscle tenderness. Negative for joint swelling, myalgias, morning stiffness and myalgias.  ?Skin:   Negative for color change, rash and redness.  ?Allergic/Immunologic: Negative for susceptible to infections.  ?Neurological:  Positive for headaches. Negative for dizziness, numbness, memory loss and weakness.  ?Hematological:  Negative for bruising/bleeding tendency.  ?Psychiatric/Behavioral:  Negative for confusion.   ? ?PMFS History:  ?Patient Active Problem List  ? Diagnosis Date Noted  ? Primary osteoarthritis of both hands 09/08/2017  ? Primary osteoarthritis of both feet 09/08/2017  ? Primary osteoarthritis of both knees 09/08/2017  ? History of gastroesophageal reflux (GERD) 09/08/2017  ? Dyslipidemia 09/08/2017  ? Osteopenia of multiple sites 09/08/2017  ? Right ankle swelling 06/26/2017  ? Rheumatoid arthritis ([facility]) 06/26/2017  ? DDD (degenerative disc disease), cervical 09/13/2016  ? Localized primary carpometacarpal osteoarthritis, right 08/26/2016  ? Rotator cuff dysfunction, right 08/26/2016  ? Metatarsalgia with a left third MTP synovitis 08/01/2016  ? Metatarsal stress fracture of left foot 04/19/2016  ? Abnormal EKG 11/06/2014  ? Chest pain 06/10/2014  ? Plantar fasciitis, bilateral 11/25/2013  ?  ?Past Medical History:  ?Diagnosis Date  ? Allergic rhinitis   ? Esophageal reflux   ? Incomplete right bundle branch block   ? Rheumatoid arthritis ([place])   ? Situational anxiety   ?  ?Family History  ?Problem Relation Age of Onset  ? Prostate cancer Father   ? Arrhythmia Father   ? Breast cancer Sister   ? Hypertension Mother   ? ?Past Surgical History:  ?Procedure Laterality  Date  ? BREAST EXCISIONAL BIOPSY    ? Breast mass removal    ? ?Social History  ? ?Social History Narrative  ? Not on file  ? ?Immunization History  ?Administered Date(s) Administered  ? PFIZER(Purple Top)SARS-COV-2 Vaccination 11/25/2019, 12/16/2019, 11/12/2020  ?  ? ?Objective: ?Vital Signs: BP 138/89 (BP Location: Left Arm, Patient Position: Sitting, Cuff Size: Normal)   Pulse 61   Ht '5\' 2"'$  (1.575 m)   Wt 126 lb 3.2 oz (57.2  kg)   BMI 23.08 kg/m?   ? ?Physical Exam ?Vitals and nursing note reviewed.  ?Constitutional:   ?   Appearance: She is well-developed.  ?HENT:  ?   Head: Normocephalic and atraumatic.  ?Eyes:  ?   Conjunctiva/sclera: Conjunctivae normal.  ?Cardiovascular:  ?   Rate and Rhythm: Normal rate and regular rhythm.  ?   Heart sounds: Normal heart sounds.  ?Pulmonary:  ?   Effort: Pulmonary effort is normal.  ?   Breath sounds: Normal breath sounds.  ?Abdominal:  ?   General: Bowel sounds are normal.  ?   Palpations: Abdomen is soft.  ?Musculoskeletal:  ?   Cervical back: Normal range of motion.  ?Lymphadenopathy:  ?   Cervical: No cervical adenopathy.  ?Skin: ?   General: Skin is warm and dry.  ?   Capillary Refill: Capillary refill takes less than 2 seconds.  ?Neurological:  ?   Mental Status: She is alert and oriented to person, place, and time.  ?Psychiatric:     ?   Behavior: Behavior normal.  ?  ? ?Musculoskeletal Exam: C-spine with good range of motion.  Shoulder joints, elbow joints, wrist joints, MCPs PIPs and DIPs with good range of motion with no synovitis.  Hip joints, knee joints, ankles, MTPs and PIPs with good range of motion with no synovitis.  She had no warmth swelling or tenderness over her left knee joint. ? ?CDAI Exam: ?CDAI Score: -- ?Patient Global: --; Provider Global: -- ?Swollen: --; Tender: -- ?Joint Exam 01/13/2022  ? ?No joint exam has been documented for this visit  ? ?There is currently no information documented on the homunculus. Go to the Rheumatology activity and complete the homunculus joint exam. ? ?Investigation: ?No additional findings. ? ?Imaging: ?US Venous Img Lower Unilateral Left (DVT) ? ?Result Date: 01/10/2022 ?CLINICAL DATA:  Pain, swelling X 2 days EXAM: LEFT LOWER EXTREMITY VENOUS DOPPLER ULTRASOUND TECHNIQUE: Gray-scale sonography with compression, as well as color and duplex ultrasound, were performed to evaluate the deep venous system(s) from the level of the common femoral  vein through the popliteal and proximal calf veins. COMPARISON:  None. FINDINGS: VENOUS Normal compressibility of the common femoral, superficial femoral, and popliteal veins, as well as the visualized calf veins. Visualized portions of profunda femoral vein and great saphenous vein unremarkable. No filling defects to suggest DVT on grayscale or color Doppler imaging. Doppler waveforms show normal direction of venous flow, normal respiratory plasticity and response to augmentation. Limited views of the contralateral common femoral vein are unremarkable. OTHER None. Limitations: none IMPRESSION: Negative. Electronically Signed   By: Lucrezia Europe M.D.   On: 01/10/2022 16:20   ? ?Recent Labs: ?Lab Results  ?Component Value Date  ? WBC 5.8 11/03/2021  ? HGB 14.4 11/03/2021  ? PLT 242 11/03/2021  ? NA 140 11/03/2021  ? K 4.5 11/03/2021  ? CL 102 11/03/2021  ? CO2 26 11/03/2021  ? GLUCOSE 91 11/03/2021  ? BUN 18 11/03/2021  ? CREATININE  0.83 11/03/2021  ? BILITOT 0.5 11/03/2021  ? ALKPHOS 82 11/03/2021  ? AST 40 11/03/2021  ? ALT 26 11/03/2021  ? PROT 6.9 11/03/2021  ? ALBUMIN 4.4 11/03/2021  ? CALCIUM 9.6 11/03/2021  ? GFRAA 92 03/01/2021  ? QFTBGOLD NEGATIVE 08/14/2017  ? ? ?Speciality Comments: No specialty comments available. ? ?Procedures:  ?No procedures performed ?Allergies: Otho Darner allergy]  ? ?Assessment / Plan:     ?Visit Diagnoses: Rheumatoid arthritis involving multiple sites with positive rheumatoid factor (HCC)-her rheumatoid arthritis seems to be well controlled with the methotrexate.  She had no synovitis on examination. ? ?High risk medication use - Methotrexate 5 tablets by mouth every 7 days and folic acid 1 mg 1 tablet daily. ? ?Acute pain of left knee-patient states that she went for spending on last Saturday and on Sunday she started having pain in her left knee and left calf.  She had Doppler study by her PCP on Monday which was negative for DVTs.  She started taking over-the-counter Motrin  which has helped her symptoms.  She still has some discomfort in her left knee.  No warmth swelling or effusion was noted.  I believe she probably developed a strain in her left knee from biking.  I advised he

## 2022-01-14 ENCOUNTER — Other Ambulatory Visit (HOSPITAL_BASED_OUTPATIENT_CLINIC_OR_DEPARTMENT_OTHER): Payer: Self-pay

## 2022-02-10 ENCOUNTER — Telehealth: Payer: Self-pay | Admitting: Cardiovascular Disease

## 2022-02-10 NOTE — Telephone Encounter (Signed)
Patient request vascuscreen.  Will confirm cost and let her know.  Will also try to move her appointment with Dr. Angelena Form up.  She is due in May, scheduled in September.  ?

## 2022-02-10 NOTE — Telephone Encounter (Signed)
? ?  Pt would like to request if Dr. Angelena Form can order carotid and AAA duplex test for her to check blood clots  ?

## 2022-02-25 NOTE — Telephone Encounter (Signed)
Pt has not read her MyChart message as of this time but her appointment with Dr. Angelena Form has been moved up to this Monday 02/28/22.  Vascuscreening can be discussed at this time. ?

## 2022-02-28 ENCOUNTER — Encounter: Payer: Self-pay | Admitting: Cardiovascular Disease

## 2022-02-28 ENCOUNTER — Ambulatory Visit: Payer: 59 | Admitting: Cardiovascular Disease

## 2022-02-28 VITALS — BP 128/72 | HR 62 | Ht 62.0 in | Wt 126.0 lb

## 2022-02-28 DIAGNOSIS — Z7189 Other specified counseling: Secondary | ICD-10-CM | POA: Diagnosis not present

## 2022-02-28 DIAGNOSIS — E785 Hyperlipidemia, unspecified: Secondary | ICD-10-CM | POA: Diagnosis not present

## 2022-02-28 DIAGNOSIS — K219 Gastro-esophageal reflux disease without esophagitis: Secondary | ICD-10-CM | POA: Diagnosis not present

## 2022-02-28 DIAGNOSIS — Z136 Encounter for screening for cardiovascular disorders: Secondary | ICD-10-CM

## 2022-02-28 DIAGNOSIS — I4510 Unspecified right bundle-branch block: Secondary | ICD-10-CM

## 2022-02-28 DIAGNOSIS — M069 Rheumatoid arthritis, unspecified: Secondary | ICD-10-CM | POA: Diagnosis not present

## 2022-02-28 NOTE — Patient Instructions (Signed)
Medication Instructions:  ?No changes ?*If you need a refill on your cardiac medications before your next appointment, please call your pharmacy* ? ? ?Lab Work: ?none ? ? ?Testing/Procedures: ?Vascuscreen - carotids only - out of pocket cost ? ? ?Follow-Up: ?As needed  ? ?Important Information About Sugar ? ? ? ? ?  ?

## 2022-02-28 NOTE — Progress Notes (Signed)
? ?Chief Complaint  ?Patient presents with  ? Follow-up  ?  Cardiac risk counseling.  ?  ? ?History of Present Illness: 61 yo female with history of rheumatoid arthritis, hyperlipidemia, GERD and anxiety who is here today for follow up. I saw her as a new consult in June 2015 for the evaluation of chest pain. Echo June 2015 with normal LV size and function, no valve issues. Exercise stress test August 2015 with no ischemic changes. Her pain was felt to be GI related and she was continued on a PPI. She was diagnosed with rheumatoid arthritis in 2018. Echo May 2019 with normal LV size and function with LVEF=65-70%. No valve disease. She is an Adult nurse. ? ?She is here today for follow up. The patient denies any chest pain, dyspnea, palpitations, lower extremity edema, orthopnea, PND, dizziness, near syncope or syncope. She wishes to have carotid artery dopplers for screening.  ? ?Primary Care Physician: Shirline Frees, MD ? ?Past Medical History:  ?Diagnosis Date  ? Allergic rhinitis   ? Esophageal reflux   ? Incomplete right bundle branch block   ? Rheumatoid arthritis ([facility])   ? Situational anxiety   ? ? ?Past Surgical History:  ?Procedure Laterality Date  ? BREAST EXCISIONAL BIOPSY    ? Breast mass removal    ? ? ?Current Outpatient Medications  ?Medication Sig Dispense Refill  ? Acetaminophen (TYLENOL PO) Take by mouth as needed.    ? ALPRAZolam (XANAX) 0.25 MG tablet 1 tablet Orally Twice a day as needed for acute anxiety 30 days 30 tablet 0  ? Cholecalciferol (VITAMIN D3 PO) Take by mouth 1 day or 1 dose.    ? Docusate Sodium (COLACE PO) Take by mouth as needed.    ? Famotidine (PEPCID PO) Take 20 mg by mouth as needed.     ? folic acid (FOLVITE) 1 MG tablet Take 1 tablet (1 mg total) by mouth daily. 90 tablet 3  ? ibuprofen (ADVIL,MOTRIN) 200 MG tablet Take 200 mg by mouth as needed.    ? methotrexate (RHEUMATREX) 2.5 MG tablet Take 5 tablets (12.5 mg total) by mouth once a week. Caution:Chemotherapy.  Protect from light. 60 tablet 0  ? Probiotic CAPS Probiotic    ? VITAMIN E PO vitamin E    ? Diclofenac Sodium 3 % GEL Apply 2 to 4 grams to affected joint 4 times daily as needed. (Patient not taking: Reported on 02/28/2022) 400 g 2  ? ?No current facility-administered medications for this visit.  ? ? ?Allergies  ?Allergen Reactions  ? Crab [Shellfish Allergy]   ? ? ?Social History  ? ?Socioeconomic History  ? Marital status: Married  ?  Spouse name: Not on file  ? Number of children: 3  ? Years of education: Not on file  ? Highest education level: Not on file  ?Occupational History  ? Occupation: Therapist, sports  ?  Employer: [employer]  ?Tobacco Use  ? Smoking status: Never  ?  Passive exposure: Never  ? Smokeless tobacco: Never  ?Vaping Use  ? Vaping Use: Never used  ?Substance and Sexual Activity  ? Alcohol use: No  ? Drug use: No  ? Sexual activity: Not on file  ?Other Topics Concern  ? Not on file  ?Social History Narrative  ? Not on file  ? ?Social Determinants of Health  ? ?Financial Resource Strain: Not on file  ?Food Insecurity: Not on file  ?Transportation Needs: Not on file  ?Physical Activity: Not on  file  ?Stress: Not on file  ?Social Connections: Not on file  ?Intimate Partner Violence: Not on file  ? ? ?Family History  ?Problem Relation Age of Onset  ? Prostate cancer Father   ? Arrhythmia Father   ? Breast cancer Sister   ? Hypertension Mother   ? ? ?Review of Systems:  As stated in the HPI and otherwise negative.  ? ?BP 128/72   Pulse 62   Ht '5\' 2"'$  (1.575 m)   Wt 126 lb (57.2 kg)   SpO2 98%   BMI 23.05 kg/m?  ? ?Physical Examination: ? ?General: Well developed, well nourished, NAD  ?HEENT: OP clear, mucus membranes moist  ?SKIN: warm, dry. No rashes. ?Neuro: No focal deficits  ?Musculoskeletal: Muscle strength 5/5 all ext  ?Psychiatric: Mood and affect normal  ?Neck: No JVD, no carotid bruits, no thyromegaly, no lymphadenopathy.  ?Lungs:Clear bilaterally, no wheezes, rhonci, crackles ?Cardiovascular:  Regular rate and rhythm. No murmurs, gallops or rubs. ?Abdomen:Soft. Bowel sounds present. Non-tender.  ?Extremities: No lower extremity edema. Pulses are 2 + in the bilateral DP/PT. ? ?EKG:  EKG is ordered today. ?The ekg ordered today demonstrates Sinus, NSSTTWA unchanged. Incomplete RBBB ? ?Recent Labs: ?11/03/2021: ALT 26; BUN 18; Creatinine, Ser 0.83; Hemoglobin 14.4; Platelets 242; Potassium 4.5; Sodium 140  ? ?Lipid Panel ?No results found for: CHOL, TRIG, HDL, CHOLHDL, VLDL, LDLCALC, LDLDIRECT ?  ?Wt Readings from Last 3 Encounters:  ?02/28/22 126 lb (57.2 kg)  ?01/13/22 126 lb 3.2 oz (57.2 kg)  ?01/04/22 124 lb 12.8 oz (56.6 kg)  ?  ? ?Other studies Reviewed: ?Additional studies/ records that were reviewed today include:  ?Review of the above records demonstrates:  ? ?Assessment and Plan:  ? ? ?1. Cardiac risk counseling: She has a normal cardiac exam. She is known to have normal LV function by echo in 2019. No carotid bruits. Will arrange Vascu Screening carotid artery dopplers.  ? ?Current medicines are reviewed at length with the patient today.  The patient does not have concerns regarding medicines. ? ?The following changes have been made:  no change ? ?Labs/ tests ordered today include:  ? ?Orders Placed This Encounter  ?Procedures  ? VAS US VASCUSCREEN  ? ? ? ?Disposition:   F/U with me as needed.  ? ? ?Signed, ?Lauree Chandler, MD ?02/28/2022 3:18 PM    ?[place] ?[address], [place], [place]  [address] ?Phone: [phone]; Fax: [phone]  ?

## 2022-03-01 NOTE — Addendum Note (Signed)
Addended by: Janan Halter F on: 03/01/2022 05:14 PM ? ? Modules accepted: Orders ? ?

## 2022-03-14 ENCOUNTER — Ambulatory Visit (HOSPITAL_COMMUNITY)
Admission: RE | Admit: 2022-03-14 | Discharge: 2022-03-14 | Disposition: A | Discharge status: Home / Self Care | Payer: 59 | Source: Ambulatory Visit | Attending: Internal Medicine | Admitting: Internal Medicine

## 2022-03-14 DIAGNOSIS — Z7189 Other specified counseling: Secondary | ICD-10-CM

## 2022-03-22 DIAGNOSIS — H2513 Age-related nuclear cataract, bilateral: Secondary | ICD-10-CM | POA: Diagnosis not present

## 2022-03-22 DIAGNOSIS — M069 Rheumatoid arthritis, unspecified: Secondary | ICD-10-CM | POA: Diagnosis not present

## 2022-03-22 DIAGNOSIS — H3581 Retinal edema: Secondary | ICD-10-CM | POA: Diagnosis not present

## 2022-03-25 ENCOUNTER — Other Ambulatory Visit (HOSPITAL_BASED_OUTPATIENT_CLINIC_OR_DEPARTMENT_OTHER): Payer: Self-pay

## 2022-04-07 ENCOUNTER — Ambulatory Visit (HOSPITAL_BASED_OUTPATIENT_CLINIC_OR_DEPARTMENT_OTHER): Payer: 59

## 2022-04-07 ENCOUNTER — Other Ambulatory Visit (HOSPITAL_BASED_OUTPATIENT_CLINIC_OR_DEPARTMENT_OTHER): Payer: Self-pay

## 2022-04-07 ENCOUNTER — Other Ambulatory Visit: Payer: Self-pay | Admitting: Physician Assistant

## 2022-04-07 ENCOUNTER — Other Ambulatory Visit: Payer: Self-pay | Admitting: *Deleted

## 2022-04-07 DIAGNOSIS — Z79899 Other long term (current) drug therapy: Secondary | ICD-10-CM

## 2022-04-07 MED ORDER — METHOTREXATE 2.5 MG PO TABS
12.5000 mg | ORAL_TABLET | ORAL | 0 refills | Status: DC
  Filled 2022-04-07: qty 20, 28d supply, fill #0

## 2022-04-07 NOTE — Telephone Encounter (Signed)
Next Visit: 06/14/2022  Last Visit: 01/13/2022  Last Fill: 01/10/2022  DX: Rheumatoid arthritis involving multiple sites with positive rheumatoid factor   Current Dose per office note 01/13/2022: Methotrexate 5 tablets by mouth every 7 days   Labs: 11/03/2021 CBC and CMP WNL   Patient advised she is overdue to update labs and she will try to update labs today or tomorrow.   Okay to refill MTX?

## 2022-04-08 ENCOUNTER — Other Ambulatory Visit: Payer: Self-pay | Admitting: *Deleted

## 2022-04-08 DIAGNOSIS — Z79899 Other long term (current) drug therapy: Secondary | ICD-10-CM

## 2022-04-08 DIAGNOSIS — M0579 Rheumatoid arthritis with rheumatoid factor of multiple sites without organ or systems involvement: Secondary | ICD-10-CM

## 2022-04-08 LAB — CBC WITH DIFFERENTIAL/PLATELET
Basophils Absolute: 0.1 10*3/uL (ref 0.0–0.2)
Basos: 1 %
EOS (ABSOLUTE): 0.1 10*3/uL (ref 0.0–0.4)
Eos: 2 %
Hematocrit: 40.1 % (ref 34.0–46.6)
Hemoglobin: 14 g/dL (ref 11.1–15.9)
Immature Grans (Abs): 0.1 10*3/uL (ref 0.0–0.1)
Immature Granulocytes: 1 %
Lymphocytes Absolute: 2.6 10*3/uL (ref 0.7–3.1)
Lymphs: 37 %
MCH: 32.6 pg (ref 26.6–33.0)
MCHC: 34.9 g/dL (ref 31.5–35.7)
MCV: 94 fL (ref 79–97)
Monocytes Absolute: 0.5 10*3/uL (ref 0.1–0.9)
Monocytes: 7 %
Neutrophils Absolute: 3.8 10*3/uL (ref 1.4–7.0)
Neutrophils: 52 %
Platelets: 258 10*3/uL (ref 150–450)
RBC: 4.29 x10E6/uL (ref 3.77–5.28)
RDW: 13.2 % (ref 11.7–15.4)
WBC: 7.1 10*3/uL (ref 3.4–10.8)

## 2022-04-08 LAB — CMP14+EGFR
ALT: 34 IU/L — ABNORMAL HIGH (ref 0–32)
AST: 38 IU/L (ref 0–40)
Albumin/Globulin Ratio: 2 (ref 1.2–2.2)
Albumin: 4.4 g/dL (ref 3.8–4.9)
Alkaline Phosphatase: 84 IU/L (ref 44–121)
BUN/Creatinine Ratio: 18 (ref 12–28)
BUN: 22 mg/dL (ref 8–27)
Bilirubin Total: 0.5 mg/dL (ref 0.0–1.2)
CO2: 24 mmol/L (ref 20–29)
Calcium: 9.2 mg/dL (ref 8.7–10.3)
Chloride: 104 mmol/L (ref 96–106)
Creatinine, Ser: 1.25 mg/dL — ABNORMAL HIGH (ref 0.57–1.00)
Globulin, Total: 2.2 g/dL (ref 1.5–4.5)
Glucose: 62 mg/dL — ABNORMAL LOW (ref 70–99)
Potassium: 4.6 mmol/L (ref 3.5–5.2)
Sodium: 143 mmol/L (ref 134–144)
Total Protein: 6.6 g/dL (ref 6.0–8.5)
eGFR: 49 mL/min/{1.73_m2} — ABNORMAL LOW (ref >59)

## 2022-04-08 NOTE — Progress Notes (Signed)
CBC is normal.  Creatinine is elevated and liver functions are elevated.  Patient should avoid all NSAIDs.  Advil and Voltaren gel 3% are listed on her medication list which she should stop.  I would recommend repeat CMP in 1 month.

## 2022-04-18 ENCOUNTER — Other Ambulatory Visit (HOSPITAL_BASED_OUTPATIENT_CLINIC_OR_DEPARTMENT_OTHER): Payer: Self-pay

## 2022-04-21 ENCOUNTER — Other Ambulatory Visit (HOSPITAL_BASED_OUTPATIENT_CLINIC_OR_DEPARTMENT_OTHER): Payer: Self-pay

## 2022-04-21 DIAGNOSIS — R1013 Epigastric pain: Secondary | ICD-10-CM | POA: Diagnosis not present

## 2022-04-21 MED ORDER — FAMOTIDINE 20 MG PO TABS
ORAL_TABLET | ORAL | 1 refills | Status: DC
  Filled 2022-04-21: qty 60, 30d supply, fill #0

## 2022-05-06 ENCOUNTER — Other Ambulatory Visit (HOSPITAL_BASED_OUTPATIENT_CLINIC_OR_DEPARTMENT_OTHER): Payer: Self-pay

## 2022-05-06 ENCOUNTER — Other Ambulatory Visit: Payer: Self-pay | Admitting: Rheumatology

## 2022-05-06 MED ORDER — METHOTREXATE 2.5 MG PO TABS
12.5000 mg | ORAL_TABLET | ORAL | 0 refills | Status: DC
  Filled 2022-05-06: qty 60, 84d supply, fill #0

## 2022-05-06 NOTE — Telephone Encounter (Signed)
Next Visit: 06/14/2022   Last Visit: 01/13/2022   Last Fill: 04/07/2022 (30 day supply)   DX: Rheumatoid arthritis involving multiple sites with positive rheumatoid factor    Current Dose per office note 01/13/2022: Methotrexate 5 tablets by mouth every 7 days    Labs: 04/07/2022 CBC is normal.  Creatinine is elevated and liver functions are elevated.   Okay to refill MTX?

## 2022-05-09 ENCOUNTER — Other Ambulatory Visit (HOSPITAL_BASED_OUTPATIENT_CLINIC_OR_DEPARTMENT_OTHER): Payer: Self-pay

## 2022-05-11 ENCOUNTER — Other Ambulatory Visit: Payer: Self-pay | Admitting: *Deleted

## 2022-05-11 ENCOUNTER — Other Ambulatory Visit (HOSPITAL_COMMUNITY): Payer: Self-pay

## 2022-05-11 ENCOUNTER — Other Ambulatory Visit (HOSPITAL_BASED_OUTPATIENT_CLINIC_OR_DEPARTMENT_OTHER): Payer: Self-pay

## 2022-05-11 ENCOUNTER — Other Ambulatory Visit: Payer: Self-pay | Admitting: Rheumatology

## 2022-05-11 MED ORDER — METHOTREXATE 2.5 MG PO TABS
12.5000 mg | ORAL_TABLET | ORAL | 0 refills | Status: DC
  Filled 2022-05-11: qty 60, 84d supply, fill #0
  Filled 2022-05-13: qty 20, 28d supply, fill #0

## 2022-05-11 NOTE — Telephone Encounter (Signed)
Okay to send a prescription to [facility].  Please notify patient that she should confirm with the [facility] pharmacy about the manufacturer prior to picking up the prescription

## 2022-05-11 NOTE — Telephone Encounter (Signed)
Patient advise okay to send a prescription to [facility].  Patient advised that she should confirm with the [facility] pharmacy about the manufacturer prior to picking up the prescription.

## 2022-05-11 NOTE — Addendum Note (Signed)
Addended by: Carole Binning on: 05/11/2022 04:36 PM   Modules accepted: Orders

## 2022-05-11 NOTE — Telephone Encounter (Signed)
Patient called the office and left message stating she would like a new prescription sent for MTX. Prescription was just sent on 05/06/2022. Patient did pick it up from the pharmacy. Patient states she is felling unwell taking the MTX from the current manufacturer. Patient is requesting a new prescription to be sent to [facility] so she can get the medication from a  different manufacturer. Please advise.

## 2022-05-13 ENCOUNTER — Other Ambulatory Visit (HOSPITAL_COMMUNITY): Payer: Self-pay

## 2022-05-16 ENCOUNTER — Other Ambulatory Visit (HOSPITAL_BASED_OUTPATIENT_CLINIC_OR_DEPARTMENT_OTHER): Payer: Self-pay

## 2022-05-16 DIAGNOSIS — M069 Rheumatoid arthritis, unspecified: Secondary | ICD-10-CM | POA: Diagnosis not present

## 2022-05-16 DIAGNOSIS — R110 Nausea: Secondary | ICD-10-CM | POA: Diagnosis not present

## 2022-05-16 MED ORDER — ONDANSETRON 4 MG PO TBDP
ORAL_TABLET | ORAL | 0 refills | Status: DC
  Filled 2022-05-16: qty 30, 30d supply, fill #0

## 2022-05-17 ENCOUNTER — Other Ambulatory Visit (HOSPITAL_BASED_OUTPATIENT_CLINIC_OR_DEPARTMENT_OTHER): Payer: Self-pay

## 2022-05-24 ENCOUNTER — Telehealth: Payer: Self-pay | Admitting: Rheumatology

## 2022-05-24 NOTE — Telephone Encounter (Signed)
Attempted to contact the patient and left message for patient to call the office.  

## 2022-05-24 NOTE — Telephone Encounter (Signed)
Patient left a voicemail requesting a return call regarding her Methotrexate medication.  Patient would like to switch back to an injection for a while.  Patient states she is experiencing a lot of side effects with the pills since they changed the manufacturer.

## 2022-05-25 ENCOUNTER — Other Ambulatory Visit: Payer: Self-pay | Admitting: Rheumatology

## 2022-05-25 ENCOUNTER — Other Ambulatory Visit (HOSPITAL_BASED_OUTPATIENT_CLINIC_OR_DEPARTMENT_OTHER): Payer: Self-pay

## 2022-05-25 MED ORDER — METHOTREXATE SODIUM CHEMO INJECTION 50 MG/2ML
12.5000 mg | INTRAMUSCULAR | 0 refills | Status: DC
  Filled 2022-05-25: qty 6, 84d supply, fill #0

## 2022-05-25 MED ORDER — "BD TB SYRINGE 27G X 1/2"" 1 ML MISC"
12.0000 | 3 refills | Status: AC
  Filled 2022-05-25: qty 12, 84d supply, fill #0
  Filled 2022-07-08: qty 12, 84d supply, fill #1

## 2022-05-25 NOTE — Telephone Encounter (Signed)
Patient advised that the vial of MTX is on backorder and we will not be able to switch her to injectable MTX at this time.   Patient states she is having a lot of increased nausea with the MTX tablets. Patient states she has a prescription for Zofran from her PCP.   Patient would like to know if she can decrease her dose on MTX to 4 tabs. Patient states she is overall doing well and has not flaring a lot lately. Please advise.

## 2022-05-25 NOTE — Telephone Encounter (Signed)
Patient called the office stating [facility] has injectable methotrexate. Patient requests a prescription be sent in and states they are waiting for it.

## 2022-05-25 NOTE — Telephone Encounter (Signed)
Patient advised it is okay for patient to reduce the dose of methotrexate to 4 tablets p.o. weekly.  Patient that reducing the dose may precipitate a flare.

## 2022-05-25 NOTE — Telephone Encounter (Signed)
It is okay for patient to reduce the dose of methotrexate to 4 tablets p.o. weekly.  Please make her aware that reducing the dose may precipitate a flare.

## 2022-05-31 NOTE — Progress Notes (Signed)
Office Visit Note  Patient: Gina Mitchell             Date of Birth: [birth year]           MRN: [id number]             PCP: Shirline Frees, MD Referring: Shirline Frees, MD Visit Date: 06/14/2022 Occupation: '@GUAROCC'$ @  Subjective:  Medication management  History of Present Illness: Gina Mitchell is a 61 y.o. female with history of seronegative rheumatoid arthritis, osteoarthritis and degenerative disc disease.  She states that she is back on methotrexate 0.5 mL subcu weekly.  She was on methotrexate tablets for a while due to shortage of the subcutaneous methotrexate.  She had increased GI side effects on oral methotrexate.  She complains of intermittent pain and swelling in her hands when she is under a lot of stress.  She works full-time and also has been taking care of her husband who had a stroke.  Patient states she had recent labs with her PCP which were within normal limits.  Activities of Daily Living:  Patient reports morning stiffness for 0 minutes.   Patient Denies nocturnal pain.  Difficulty dressing/grooming: Denies Difficulty climbing stairs: Denies Difficulty getting out of chair: Denies Difficulty using hands for taps, buttons, cutlery, and/or writing: Denies  Review of Systems  Constitutional:  Positive for fatigue.  HENT:  Positive for mouth sores and mouth dryness.        Fever blisters  Eyes:  Positive for dryness.  Respiratory:  Negative for shortness of breath.   Cardiovascular:  Negative for chest pain and palpitations.  Gastrointestinal:  Positive for constipation. Negative for blood in stool and diarrhea.  Endocrine: Negative for increased urination.  Genitourinary:  Negative for involuntary urination.  Musculoskeletal:  Positive for myalgias and myalgias. Negative for joint pain, joint pain, joint swelling, muscle weakness, morning stiffness and muscle tenderness.  Skin:  Positive for rash and hair loss. Negative for color change and sensitivity to  sunlight.  Allergic/Immunologic: Negative for susceptible to infections.  Neurological:  Positive for headaches. Negative for dizziness.  Hematological:  Negative for swollen glands.  Psychiatric/Behavioral:  Positive for sleep disturbance. Negative for depressed mood. The patient is not nervous/anxious.     PMFS History:  Patient Active Problem List   Diagnosis Date Noted   Primary osteoarthritis of both hands 09/08/2017   Primary osteoarthritis of both feet 09/08/2017   Primary osteoarthritis of both knees 09/08/2017   History of gastroesophageal reflux (GERD) 09/08/2017   Dyslipidemia 09/08/2017   Osteopenia of multiple sites 09/08/2017   Right ankle swelling 06/26/2017   Rheumatoid arthritis ([place]) 06/26/2017   DDD (degenerative disc disease), cervical 09/13/2016   Localized primary carpometacarpal osteoarthritis, right 08/26/2016   Rotator cuff dysfunction, right 08/26/2016   Metatarsalgia with a left third MTP synovitis 08/01/2016   Metatarsal stress fracture of left foot 04/19/2016   Abnormal EKG 11/06/2014   Chest pain 06/10/2014   Plantar fasciitis, bilateral 11/25/2013    Past Medical History:  Diagnosis Date   Allergic rhinitis    Esophageal reflux    Incomplete right bundle branch block    Rheumatoid arthritis ([place])    Situational anxiety     Family History  Problem Relation Age of Onset   Prostate cancer Father    Arrhythmia Father    Breast cancer Sister    Hypertension Mother    Past Surgical History:  Procedure Laterality Date   BREAST EXCISIONAL BIOPSY  Breast mass removal     Social History   Social History Narrative   Not on file   Immunization History  Administered Date(s) Administered   PFIZER(Purple Top)SARS-COV-2 Vaccination 11/25/2019, 12/16/2019, 11/12/2020     Objective: Vital Signs: BP 109/78 (BP Location: Left Arm, Patient Position: Sitting, Cuff Size: Normal)   Pulse 69   Resp 14   Ht '5\' 2"'$  (1.575 m)   Wt 126 lb 6.4 oz (57.3  kg)   BMI 23.12 kg/m    Physical Exam Vitals and nursing note reviewed.  Constitutional:      Appearance: She is well-developed.  HENT:     Head: Normocephalic and atraumatic.  Eyes:     Conjunctiva/sclera: Conjunctivae normal.  Cardiovascular:     Rate and Rhythm: Normal rate and regular rhythm.     Heart sounds: Normal heart sounds.  Pulmonary:     Effort: Pulmonary effort is normal.     Breath sounds: Normal breath sounds.  Abdominal:     General: Bowel sounds are normal.     Palpations: Abdomen is soft.  Musculoskeletal:     Cervical back: Normal range of motion.  Lymphadenopathy:     Cervical: No cervical adenopathy.  Skin:    General: Skin is warm and dry.     Capillary Refill: Capillary refill takes less than 2 seconds.  Neurological:     Mental Status: She is alert and oriented to person, place, and time.  Psychiatric:        Behavior: Behavior normal.      Musculoskeletal Exam: C-spine thoracic and lumbar spine were in good range of motion.  Shoulder joints, elbow joints, wrist joints, MCPs PIPs and DIPs with good range of motion with no synovitis.  PIP and DIP thickening was noted consistent with osteoarthritis.  Hip joints, knee joints, ankles, MTPs and PIPs with good range of motion with no synovitis.  CDAI Exam: CDAI Score: 0.6  Patient Global: 3 mm; Provider Global: 3 mm Swollen: 0 ; Tender: 0  Joint Exam 06/14/2022   No joint exam has been documented for this visit   There is currently no information documented on the homunculus. Go to the Rheumatology activity and complete the homunculus joint exam.  Investigation: No additional findings.  Imaging: No results found.  Recent Labs: Lab Results  Component Value Date   WBC 7.1 04/07/2022   HGB 14.0 04/07/2022   PLT 258 04/07/2022   NA 143 04/07/2022   K 4.6 04/07/2022   CL 104 04/07/2022   CO2 24 04/07/2022   GLUCOSE 62 (L) 04/07/2022   BUN 22 04/07/2022   CREATININE 1.25 (H) 04/07/2022    BILITOT 0.5 04/07/2022   ALKPHOS 84 04/07/2022   AST 38 04/07/2022   ALT 34 (H) 04/07/2022   PROT 6.6 04/07/2022   ALBUMIN 4.4 04/07/2022   CALCIUM 9.2 04/07/2022   GFRAA 92 03/01/2021   QFTBGOLD NEGATIVE 08/14/2017      Speciality Comments: No specialty comments available.  Procedures:  No procedures performed Allergies: Crab [shellfish allergy]   Assessment / Plan:     Visit Diagnoses: Rheumatoid arthritis involving multiple sites with positive rheumatoid factor (HCC) -patient complains of intermittent pain and discomfort in her bilateral hands.  No synovitis was noted.  She has been taking methotrexate 0.5 mL subcu weekly.  She was temporarily on oral methotrexate due to shortage of subcu methotrexate.  She is back on subcu methotrexate now.  She had GI side effects from oral methotrexate.  I will obtain x-rays of her bilateral hands and her feet to monitor for disease progression.  Plan: XR Hand 2 View Right, XR Hand 2 View Left, XR Foot 2 Views Right, XR Foot 2 Views Left.  X-rays obtained today did not show radiographic progression when compared to the x-rays of 2021.  High risk medication use - Methotrexate 0.5 mL subcutaneous every 7 days and folic acid 1 mg 1 tablet daily.May 17, 2022 labs done at Dr. Doreene Adas office: CBC WBC 6.2, hemoglobin 13.7, platelets 241, CMP AST 25, ALT 21, creatinine 0.94, GFR 69.  Labs were reviewed with the patient.  We will continue to monitor labs every 3 months.  She information about immunization was placed in the AVS.  She was also advised to hold methotrexate if she develops an infection and resume after the infection resolves.  Primary osteoarthritis of both hands-she can use to have some discomfort in her hands.  She has rheumatoid arthritis and osteoarthritis overlap.  Will obtain x-rays today.  Primary osteoarthritis of both knees-she has intermittent discomfort.  She is doing better with no synovitis or swelling on the examination  today.  Primary osteoarthritis of both feet-she complains of discomfort in her feet intermittently.  No synovitis was noted.  DDD (degenerative disc disease), cervical-she had good range of motion of the cervical spine.  Primary insomnia-she works night shifts and she also is under a lot of stress as her husband had a stroke.  Other fatigue-due to insomnia.  Osteopenia of multiple sites - DEXA is done by her PCP.  Dyslipidemia-diet modifications were discussed.  History of gastroesophageal reflux (GERD)-currently she is not having GI symptoms.  Retinal edema -she followed by Dr. Manuella Ghazi.  According to the patient the retinal edema resolved.  Orders: Orders Placed This Encounter  Procedures   XR Hand 2 View Right   XR Hand 2 View Left   XR Foot 2 Views Right   XR Foot 2 Views Left   No orders of the defined types were placed in this encounter.    Follow-Up Instructions: Return in about 5 months (around 11/14/2022) for Rheumatoid arthritis, Osteoarthritis.   Bo Merino, MD  Note - This record has been created using Editor, commissioning.  Chart creation errors have been sought, but may not always  have been located. Such creation errors do not reflect on  the standard of medical care.

## 2022-06-14 ENCOUNTER — Ambulatory Visit: Payer: 59 | Attending: Rheumatology | Admitting: Rheumatology

## 2022-06-14 ENCOUNTER — Ambulatory Visit (INDEPENDENT_AMBULATORY_CARE_PROVIDER_SITE_OTHER): Payer: 59

## 2022-06-14 ENCOUNTER — Encounter: Payer: Self-pay | Admitting: Rheumatology

## 2022-06-14 VITALS — BP 109/78 | HR 69 | Resp 14 | Ht 62.0 in | Wt 126.4 lb

## 2022-06-14 DIAGNOSIS — M19041 Primary osteoarthritis, right hand: Secondary | ICD-10-CM | POA: Diagnosis not present

## 2022-06-14 DIAGNOSIS — E785 Hyperlipidemia, unspecified: Secondary | ICD-10-CM

## 2022-06-14 DIAGNOSIS — M0579 Rheumatoid arthritis with rheumatoid factor of multiple sites without organ or systems involvement: Secondary | ICD-10-CM

## 2022-06-14 DIAGNOSIS — H3581 Retinal edema: Secondary | ICD-10-CM

## 2022-06-14 DIAGNOSIS — M5030 Other cervical disc degeneration, unspecified cervical region: Secondary | ICD-10-CM

## 2022-06-14 DIAGNOSIS — Z79899 Other long term (current) drug therapy: Secondary | ICD-10-CM | POA: Diagnosis not present

## 2022-06-14 DIAGNOSIS — M19042 Primary osteoarthritis, left hand: Secondary | ICD-10-CM

## 2022-06-14 DIAGNOSIS — M79642 Pain in left hand: Secondary | ICD-10-CM

## 2022-06-14 DIAGNOSIS — M19071 Primary osteoarthritis, right ankle and foot: Secondary | ICD-10-CM | POA: Diagnosis not present

## 2022-06-14 DIAGNOSIS — F5101 Primary insomnia: Secondary | ICD-10-CM | POA: Diagnosis not present

## 2022-06-14 DIAGNOSIS — M170 Bilateral primary osteoarthritis of knee: Secondary | ICD-10-CM | POA: Diagnosis not present

## 2022-06-14 DIAGNOSIS — M25562 Pain in left knee: Secondary | ICD-10-CM

## 2022-06-14 DIAGNOSIS — M79671 Pain in right foot: Secondary | ICD-10-CM

## 2022-06-14 DIAGNOSIS — M79641 Pain in right hand: Secondary | ICD-10-CM

## 2022-06-14 DIAGNOSIS — M19072 Primary osteoarthritis, left ankle and foot: Secondary | ICD-10-CM

## 2022-06-14 DIAGNOSIS — M8589 Other specified disorders of bone density and structure, multiple sites: Secondary | ICD-10-CM

## 2022-06-14 DIAGNOSIS — R5383 Other fatigue: Secondary | ICD-10-CM | POA: Diagnosis not present

## 2022-06-14 DIAGNOSIS — M79672 Pain in left foot: Secondary | ICD-10-CM

## 2022-06-14 DIAGNOSIS — Z8719 Personal history of other diseases of the digestive system: Secondary | ICD-10-CM

## 2022-06-14 NOTE — Patient Instructions (Signed)
Standing Labs We placed an order today for your standing lab work.   Please have your standing labs drawn in October and every 3 months  If possible, please have your labs drawn 2 weeks prior to your appointment so that the provider can discuss your results at your appointment.  Please note that you may see your imaging and lab results in [facility] before we have reviewed them. We may be awaiting multiple results to interpret others before contacting you. Please allow our office up to 72 hours to thoroughly review all of the results before contacting the office for clarification of your results.  We have open lab daily: Monday through Thursday from 1:30-4:30 PM and Friday from 1:30-4:00 PM at the office of Dr. Bo Merino, [facility].   Please be advised, all patients with office appointments requiring lab work will take precedent over walk-in lab work.  If possible, please come for your lab work on Monday and Friday afternoons, as you may experience shorter wait times. The office is located at [address], [place], [place], [place] [address] No appointment is necessary.   Labs are drawn by [facility]. Please bring your co-pay at the time of your lab draw.  You may receive a bill from [facility] for your lab work.  Please note if you are on Hydroxychloroquine and and an order has been placed for a Hydroxychloroquine level, you will need to have it drawn 4 hours or more after your last dose.  Vaccines You are taking a medication(s) that can suppress your immune system.  The following immunizations are recommended: Flu annually Covid-19  Td/Tdap (tetanus, diphtheria, pertussis) every 10 years Pneumonia (Prevnar 15 then Pneumovax 23 at least 1 year apart.  Alternatively, can take Prevnar 20 without needing additional dose) Shingrix: 2 doses from 4 weeks to 6 months apart  Please check with your PCP to make sure you are up to date.   If you wish to have your labs drawn at  another location, please call the office 24 hours in advance to send orders.  If you have any questions regarding directions or hours of operation,  please call [phone].   As a reminder, please drink plenty of water prior to coming for your lab work. Thanks!  If you have signs or symptoms of an infection or start antibiotics: First, call your PCP for workup of your infection. Hold your medication through the infection, until you complete your antibiotics, and until symptoms resolve if you take the following: Injectable medication (Actemra, Benlysta, Cimzia, Cosentyx, Enbrel, Humira, Kevzara, Orencia, Remicade, Simponi, Stelara, Taltz, Tremfya) Methotrexate Leflunomide (Arava) Mycophenolate (Cellcept) Morrie Sheldon, Olumiant, or Rinvoq

## 2022-07-05 ENCOUNTER — Ambulatory Visit: Payer: 59 | Admitting: Cardiovascular Disease

## 2022-07-08 ENCOUNTER — Other Ambulatory Visit (HOSPITAL_BASED_OUTPATIENT_CLINIC_OR_DEPARTMENT_OTHER): Payer: Self-pay

## 2022-07-14 DIAGNOSIS — Z6823 Body mass index (BMI) 23.0-23.9, adult: Secondary | ICD-10-CM | POA: Diagnosis not present

## 2022-07-14 DIAGNOSIS — Z1211 Encounter for screening for malignant neoplasm of colon: Secondary | ICD-10-CM | POA: Diagnosis not present

## 2022-07-14 DIAGNOSIS — R350 Frequency of micturition: Secondary | ICD-10-CM | POA: Diagnosis not present

## 2022-07-14 DIAGNOSIS — Z01419 Encounter for gynecological examination (general) (routine) without abnormal findings: Secondary | ICD-10-CM | POA: Diagnosis not present

## 2022-07-14 DIAGNOSIS — Z1231 Encounter for screening mammogram for malignant neoplasm of breast: Secondary | ICD-10-CM | POA: Diagnosis not present

## 2022-07-14 DIAGNOSIS — K5900 Constipation, unspecified: Secondary | ICD-10-CM | POA: Diagnosis not present

## 2022-07-20 ENCOUNTER — Other Ambulatory Visit (HOSPITAL_BASED_OUTPATIENT_CLINIC_OR_DEPARTMENT_OTHER): Payer: Self-pay

## 2022-08-05 ENCOUNTER — Other Ambulatory Visit: Payer: Self-pay | Admitting: Rheumatology

## 2022-08-05 ENCOUNTER — Other Ambulatory Visit (HOSPITAL_BASED_OUTPATIENT_CLINIC_OR_DEPARTMENT_OTHER): Payer: Self-pay

## 2022-08-05 MED ORDER — METHOTREXATE SODIUM CHEMO INJECTION 50 MG/2ML
12.5000 mg | INTRAMUSCULAR | 0 refills | Status: DC
  Filled 2022-08-05: qty 2, 28d supply, fill #0

## 2022-08-05 NOTE — Telephone Encounter (Signed)
Next Visit: 11/21/2021  Last Visit: 06/14/2022  Last Fill: 05/25/2022  DX:  Rheumatoid arthritis involving multiple sites with positive rheumatoid factor   Current Dose per office note 06/14/2022: Methotrexate 0.5 mL subcutaneous every 7 days   Labs: 04/07/2022 CBC is normal.  Creatinine is elevated and liver functions are elevated.  Patient should avoid all NSAIDs.  Advil and Voltaren gel 3% are listed on her medication list which she should stop.  I would recommend repeat CMP in 1 month.  Called patient to inform her that she needs labs. Patient states that she can come in for labs on 08/08/2022.   Okay to refill methotrexate?

## 2022-08-09 ENCOUNTER — Other Ambulatory Visit: Payer: Self-pay

## 2022-08-09 DIAGNOSIS — Z79899 Other long term (current) drug therapy: Secondary | ICD-10-CM | POA: Diagnosis not present

## 2022-08-10 LAB — CBC WITH DIFFERENTIAL/PLATELET
Absolute Monocytes: 462 cells/uL (ref 200–950)
Basophils Absolute: 72 cells/uL (ref 0–200)
Basophils Relative: 1.1 %
Eosinophils Absolute: 202 cells/uL (ref 15–500)
Eosinophils Relative: 3.1 %
HCT: 43.6 % (ref 35.0–45.0)
Hemoglobin: 14.7 g/dL (ref 11.7–15.5)
Lymphs Abs: 2197 cells/uL (ref 850–3900)
MCH: 32.1 pg (ref 27.0–33.0)
MCHC: 33.7 g/dL (ref 32.0–36.0)
MCV: 95.2 fL (ref 80.0–100.0)
MPV: 10.2 fL (ref 7.5–12.5)
Monocytes Relative: 7.1 %
Neutro Abs: 3569 cells/uL (ref 1500–7800)
Neutrophils Relative %: 54.9 %
Platelets: 225 10*3/uL (ref 140–400)
RBC: 4.58 10*6/uL (ref 3.80–5.10)
RDW: 13.2 % (ref 11.0–15.0)
Total Lymphocyte: 33.8 %
WBC: 6.5 10*3/uL (ref 3.8–10.8)

## 2022-08-10 LAB — COMPLETE METABOLIC PANEL WITH GFR
AG Ratio: 1.7 (calc) (ref 1.0–2.5)
ALT: 20 U/L (ref 6–29)
AST: 23 U/L (ref 10–35)
Albumin: 4.3 g/dL (ref 3.6–5.1)
Alkaline phosphatase (APISO): 83 U/L (ref 37–153)
BUN: 18 mg/dL (ref 7–25)
CO2: 30 mmol/L (ref 20–32)
Calcium: 9.5 mg/dL (ref 8.6–10.4)
Chloride: 102 mmol/L (ref 98–110)
Creat: 0.84 mg/dL (ref 0.50–1.05)
Globulin: 2.5 g/dL (calc) (ref 1.9–3.7)
Glucose, Bld: 94 mg/dL (ref 65–99)
Potassium: 4.5 mmol/L (ref 3.5–5.3)
Sodium: 139 mmol/L (ref 135–146)
Total Bilirubin: 0.6 mg/dL (ref 0.2–1.2)
Total Protein: 6.8 g/dL (ref 6.1–8.1)
eGFR: 79 mL/min/{1.73_m2} (ref >=60)

## 2022-08-10 NOTE — Progress Notes (Signed)
CBC and CMP are normal.

## 2022-08-12 DIAGNOSIS — K5909 Other constipation: Secondary | ICD-10-CM | POA: Diagnosis not present

## 2022-08-12 DIAGNOSIS — R1031 Right lower quadrant pain: Secondary | ICD-10-CM | POA: Diagnosis not present

## 2022-08-15 DIAGNOSIS — R1084 Generalized abdominal pain: Secondary | ICD-10-CM | POA: Diagnosis not present

## 2022-08-15 DIAGNOSIS — E7800 Pure hypercholesterolemia, unspecified: Secondary | ICD-10-CM | POA: Diagnosis not present

## 2022-08-16 ENCOUNTER — Other Ambulatory Visit: Payer: Self-pay | Admitting: Rheumatology

## 2022-08-16 ENCOUNTER — Other Ambulatory Visit (HOSPITAL_BASED_OUTPATIENT_CLINIC_OR_DEPARTMENT_OTHER): Payer: Self-pay

## 2022-08-16 MED ORDER — METHOTREXATE SODIUM CHEMO INJECTION 50 MG/2ML
12.5000 mg | INTRAMUSCULAR | 0 refills | Status: DC
  Filled 2022-08-16 – 2022-08-29 (×2): qty 6, 84d supply, fill #0

## 2022-08-16 NOTE — Telephone Encounter (Signed)
Next Visit: 11/21/2022  Last Visit: 06/14/2022  Last Fill: 08/05/2022  DX: Rheumatoid arthritis involving multiple sites with positive rheumatoid factor   Current Dose per office note 06/14/2022: Methotrexate 0.5 mL subcutaneous every 7 days   Labs: 08/09/2022 CBC and CMP are normal.  Okay to refill MTX?

## 2022-08-20 ENCOUNTER — Other Ambulatory Visit (HOSPITAL_BASED_OUTPATIENT_CLINIC_OR_DEPARTMENT_OTHER): Payer: Self-pay

## 2022-08-23 ENCOUNTER — Other Ambulatory Visit (HOSPITAL_BASED_OUTPATIENT_CLINIC_OR_DEPARTMENT_OTHER): Payer: Self-pay

## 2022-08-24 ENCOUNTER — Encounter (HOSPITAL_BASED_OUTPATIENT_CLINIC_OR_DEPARTMENT_OTHER): Payer: Self-pay | Admitting: Pharmacist

## 2022-08-24 ENCOUNTER — Other Ambulatory Visit (HOSPITAL_BASED_OUTPATIENT_CLINIC_OR_DEPARTMENT_OTHER): Payer: Self-pay

## 2022-08-29 ENCOUNTER — Other Ambulatory Visit (HOSPITAL_BASED_OUTPATIENT_CLINIC_OR_DEPARTMENT_OTHER): Payer: Self-pay

## 2022-08-29 DIAGNOSIS — M069 Rheumatoid arthritis, unspecified: Secondary | ICD-10-CM | POA: Diagnosis not present

## 2022-08-29 DIAGNOSIS — E7800 Pure hypercholesterolemia, unspecified: Secondary | ICD-10-CM | POA: Diagnosis not present

## 2022-08-29 DIAGNOSIS — Z0000 Encounter for general adult medical examination without abnormal findings: Secondary | ICD-10-CM | POA: Diagnosis not present

## 2022-09-06 DIAGNOSIS — H5202 Hypermetropia, left eye: Secondary | ICD-10-CM | POA: Diagnosis not present

## 2022-09-06 DIAGNOSIS — H5211 Myopia, right eye: Secondary | ICD-10-CM | POA: Diagnosis not present

## 2022-09-06 DIAGNOSIS — H5213 Myopia, bilateral: Secondary | ICD-10-CM | POA: Diagnosis not present

## 2022-09-08 ENCOUNTER — Other Ambulatory Visit (HOSPITAL_BASED_OUTPATIENT_CLINIC_OR_DEPARTMENT_OTHER): Payer: Self-pay

## 2022-09-08 DIAGNOSIS — H9201 Otalgia, right ear: Secondary | ICD-10-CM | POA: Diagnosis not present

## 2022-09-08 MED ORDER — NEOMYCIN-POLYMYXIN-HC 1 % OT SOLN
4.0000 [drp] | Freq: Three times a day (TID) | OTIC | 0 refills | Status: AC
  Filled 2022-09-08: qty 10, 10d supply, fill #0

## 2022-11-01 NOTE — Progress Notes (Deleted)
Office Visit Note  Patient: Gina Mitchell             Date of Birth: 1960/12/09           MRN: [id number]             PCP: Shirline Frees, MD Referring: Shirline Frees, MD Visit Date: 11/04/2022 Occupation: '@GUAROCC'$ @  Subjective:  No chief complaint on file.   History of Present Illness: Gina Mitchell is a 62 y.o. female ***     Activities of Daily Living:  Patient reports morning stiffness for *** {minute/hour:19697}.   Patient {ACTIONS;DENIES/REPORTS:21021675::"Denies"} nocturnal pain.  Difficulty dressing/grooming: {ACTIONS;DENIES/REPORTS:21021675::"Denies"} Difficulty climbing stairs: {ACTIONS;DENIES/REPORTS:21021675::"Denies"} Difficulty getting out of chair: {ACTIONS;DENIES/REPORTS:21021675::"Denies"} Difficulty using hands for taps, buttons, cutlery, and/or writing: {ACTIONS;DENIES/REPORTS:21021675::"Denies"}  No Rheumatology ROS completed.   PMFS History:  Patient Active Problem List   Diagnosis Date Noted  . Primary osteoarthritis of both hands 09/08/2017  . Primary osteoarthritis of both feet 09/08/2017  . Primary osteoarthritis of both knees 09/08/2017  . History of gastroesophageal reflux (GERD) 09/08/2017  . Dyslipidemia 09/08/2017  . Osteopenia of multiple sites 09/08/2017  . Right ankle swelling 06/26/2017  . Rheumatoid arthritis ([place]) 06/26/2017  . DDD (degenerative disc disease), cervical 09/13/2016  . Localized primary carpometacarpal osteoarthritis, right 08/26/2016  . Rotator cuff dysfunction, right 08/26/2016  . Metatarsalgia with a left third MTP synovitis 08/01/2016  . Metatarsal stress fracture of left foot 04/19/2016  . Abnormal EKG 11/06/2014  . Chest pain 06/10/2014  . Plantar fasciitis, bilateral 11/25/2013    Past Medical History:  Diagnosis Date  . Allergic rhinitis   . Esophageal reflux   . Incomplete right bundle branch block   . Rheumatoid arthritis ([place])   . Situational anxiety     Family History  Problem Relation Age  of Onset  . Prostate cancer Father   . Arrhythmia Father   . Breast cancer Sister   . Hypertension Mother    Past Surgical History:  Procedure Laterality Date  . BREAST EXCISIONAL BIOPSY    . Breast mass removal     Social History   Social History Narrative  . Not on file   Immunization History  Administered Date(s) Administered  . PFIZER(Purple Top)SARS-COV-2 Vaccination 11/25/2019, 12/16/2019, 11/12/2020     Objective: Vital Signs: There were no vitals taken for this visit.   Physical Exam   Musculoskeletal Exam: ***  CDAI Exam: CDAI Score: -- Patient Global: --; Provider Global: -- Swollen: --; Tender: -- Joint Exam 11/04/2022   No joint exam has been documented for this visit   There is currently no information documented on the homunculus. Go to the Rheumatology activity and complete the homunculus joint exam.  Investigation: No additional findings.  Imaging: No results found.  Recent Labs: Lab Results  Component Value Date   WBC 6.5 08/09/2022   HGB 14.7 08/09/2022   PLT 225 08/09/2022   NA 139 08/09/2022   K 4.5 08/09/2022   CL 102 08/09/2022   CO2 30 08/09/2022   GLUCOSE 94 08/09/2022   BUN 18 08/09/2022   CREATININE 0.84 08/09/2022   BILITOT 0.6 08/09/2022   ALKPHOS 84 04/07/2022   AST 23 08/09/2022   ALT 20 08/09/2022   PROT 6.8 08/09/2022   ALBUMIN 4.4 04/07/2022   CALCIUM 9.5 08/09/2022   GFRAA 92 03/01/2021   QFTBGOLD NEGATIVE 08/14/2017    Speciality Comments: No specialty comments available.  Procedures:  No procedures performed Allergies: Crab [shellfish allergy]   Assessment /  Plan:     Visit Diagnoses: No diagnosis found.  Orders: No orders of the defined types were placed in this encounter.  No orders of the defined types were placed in this encounter.   Face-to-face time spent with patient was *** minutes. Greater than 50% of time was spent in counseling and coordination of care.  Follow-Up Instructions: No  follow-ups on file.   Earnestine Mealing, CMA  Note - This record has been created using Editor, commissioning.  Chart creation errors have been sought, but may not always  have been located. Such creation errors do not reflect on  the standard of medical care.

## 2022-11-04 ENCOUNTER — Ambulatory Visit: Payer: Commercial Managed Care - PPO | Admitting: Rheumatology

## 2022-11-04 DIAGNOSIS — H3581 Retinal edema: Secondary | ICD-10-CM

## 2022-11-04 DIAGNOSIS — M8589 Other specified disorders of bone density and structure, multiple sites: Secondary | ICD-10-CM

## 2022-11-04 DIAGNOSIS — R5383 Other fatigue: Secondary | ICD-10-CM

## 2022-11-04 DIAGNOSIS — M0579 Rheumatoid arthritis with rheumatoid factor of multiple sites without organ or systems involvement: Secondary | ICD-10-CM

## 2022-11-04 DIAGNOSIS — M19071 Primary osteoarthritis, right ankle and foot: Secondary | ICD-10-CM

## 2022-11-04 DIAGNOSIS — M5030 Other cervical disc degeneration, unspecified cervical region: Secondary | ICD-10-CM

## 2022-11-04 DIAGNOSIS — E785 Hyperlipidemia, unspecified: Secondary | ICD-10-CM

## 2022-11-04 DIAGNOSIS — Z79899 Other long term (current) drug therapy: Secondary | ICD-10-CM

## 2022-11-04 DIAGNOSIS — F5101 Primary insomnia: Secondary | ICD-10-CM

## 2022-11-04 DIAGNOSIS — Z8719 Personal history of other diseases of the digestive system: Secondary | ICD-10-CM

## 2022-11-04 DIAGNOSIS — M19041 Primary osteoarthritis, right hand: Secondary | ICD-10-CM

## 2022-11-04 DIAGNOSIS — M170 Bilateral primary osteoarthritis of knee: Secondary | ICD-10-CM

## 2022-11-07 ENCOUNTER — Other Ambulatory Visit (HOSPITAL_BASED_OUTPATIENT_CLINIC_OR_DEPARTMENT_OTHER): Payer: Self-pay

## 2022-11-07 DIAGNOSIS — L718 Other rosacea: Secondary | ICD-10-CM | POA: Diagnosis not present

## 2022-11-07 MED ORDER — METRONIDAZOLE 0.75 % EX CREA
1.0000 | TOPICAL_CREAM | Freq: Two times a day (BID) | CUTANEOUS | 11 refills | Status: DC
  Filled 2022-11-07: qty 45, 23d supply, fill #0

## 2022-11-07 NOTE — Progress Notes (Deleted)
Office Visit Note  Patient: Gina Mitchell             Date of Birth: 1960-12-24           MRN: [id number]             PCP: Shirline Frees, MD Referring: Shirline Frees, MD Visit Date: 11/21/2022 Occupation: @GUAROCC$ @  Subjective:  No chief complaint on file.   History of Present Illness: Gina Mitchell is a 62 y.o. female ***     Activities of Daily Living:  Patient reports morning stiffness for *** {minute/hour:19697}.   Patient {ACTIONS;DENIES/REPORTS:21021675::"Denies"} nocturnal pain.  Difficulty dressing/grooming: {ACTIONS;DENIES/REPORTS:21021675::"Denies"} Difficulty climbing stairs: {ACTIONS;DENIES/REPORTS:21021675::"Denies"} Difficulty getting out of chair: {ACTIONS;DENIES/REPORTS:21021675::"Denies"} Difficulty using hands for taps, buttons, cutlery, and/or writing: {ACTIONS;DENIES/REPORTS:21021675::"Denies"}  No Rheumatology ROS completed.   PMFS History:  Patient Active Problem List   Diagnosis Date Noted   Primary osteoarthritis of both hands 09/08/2017   Primary osteoarthritis of both feet 09/08/2017   Primary osteoarthritis of both knees 09/08/2017   History of gastroesophageal reflux (GERD) 09/08/2017   Dyslipidemia 09/08/2017   Osteopenia of multiple sites 09/08/2017   Right ankle swelling 06/26/2017   Rheumatoid arthritis ([place]) 06/26/2017   DDD (degenerative disc disease), cervical 09/13/2016   Localized primary carpometacarpal osteoarthritis, right 08/26/2016   Rotator cuff dysfunction, right 08/26/2016   Metatarsalgia with a left third MTP synovitis 08/01/2016   Metatarsal stress fracture of left foot 04/19/2016   Abnormal EKG 11/06/2014   Chest pain 06/10/2014   Plantar fasciitis, bilateral 11/25/2013    Past Medical History:  Diagnosis Date   Allergic rhinitis    Esophageal reflux    Incomplete right bundle branch block    Rheumatoid arthritis ([place])    Situational anxiety     Family History  Problem Relation Age of Onset   Prostate  cancer Father    Arrhythmia Father    Breast cancer Sister    Hypertension Mother    Past Surgical History:  Procedure Laterality Date   BREAST EXCISIONAL BIOPSY     Breast mass removal     Social History   Social History Narrative   Not on file   Immunization History  Administered Date(s) Administered   PFIZER(Purple Top)SARS-COV-2 Vaccination 11/25/2019, 12/16/2019, 11/12/2020     Objective: Vital Signs: There were no vitals taken for this visit.   Physical Exam   Musculoskeletal Exam: ***  CDAI Exam: CDAI Score: -- Patient Global: --; Provider Global: -- Swollen: --; Tender: -- Joint Exam 11/21/2022   No joint exam has been documented for this visit   There is currently no information documented on the homunculus. Go to the Rheumatology activity and complete the homunculus joint exam.  Investigation: No additional findings.  Imaging: No results found.  Recent Labs: Lab Results  Component Value Date   WBC 6.5 08/09/2022   HGB 14.7 08/09/2022   PLT 225 08/09/2022   NA 139 08/09/2022   K 4.5 08/09/2022   CL 102 08/09/2022   CO2 30 08/09/2022   GLUCOSE 94 08/09/2022   BUN 18 08/09/2022   CREATININE 0.84 08/09/2022   BILITOT 0.6 08/09/2022   ALKPHOS 84 04/07/2022   AST 23 08/09/2022   ALT 20 08/09/2022   PROT 6.8 08/09/2022   ALBUMIN 4.4 04/07/2022   CALCIUM 9.5 08/09/2022   GFRAA 92 03/01/2021   QFTBGOLD NEGATIVE 08/14/2017    Speciality Comments: No specialty comments available.  Procedures:  No procedures performed Allergies: Crab [shellfish allergy]   Assessment /  Plan:     Visit Diagnoses: No diagnosis found.  Orders: No orders of the defined types were placed in this encounter.  No orders of the defined types were placed in this encounter.   Face-to-face time spent with patient was *** minutes. Greater than 50% of time was spent in counseling and coordination of care.  Follow-Up Instructions: No follow-ups on file.   Earnestine Mealing, CMA  Note - This record has been created using Editor, commissioning.  Chart creation errors have been sought, but may not always  have been located. Such creation errors do not reflect on  the standard of medical care.

## 2022-11-10 ENCOUNTER — Telehealth: Payer: Self-pay | Admitting: *Deleted

## 2022-11-10 NOTE — Telephone Encounter (Signed)
Left message to advise patient No, she may use Flagyl cream.

## 2022-11-10 NOTE — Telephone Encounter (Signed)
Patient contacted the office and left message stating she is having a flare of her rosacea. Patient states the dermatologist has given her a prescription for a Flagyl ointment for her face to use twice daily. Patient would like to know if there is any contraindication with using the Flagyl cream and the MTX together. Please advise.

## 2022-11-10 NOTE — Telephone Encounter (Signed)
No, she may use Flagyl cream.

## 2022-11-11 ENCOUNTER — Other Ambulatory Visit (HOSPITAL_BASED_OUTPATIENT_CLINIC_OR_DEPARTMENT_OTHER): Payer: Self-pay

## 2022-11-11 MED ORDER — CLINDAMYCIN PHOS-BENZOYL PEROX 1-5 % EX GEL
CUTANEOUS | 0 refills | Status: DC
  Filled 2022-11-11: qty 50, 30d supply, fill #0
  Filled 2022-11-11: qty 25, 30d supply, fill #0

## 2022-11-12 ENCOUNTER — Other Ambulatory Visit (HOSPITAL_BASED_OUTPATIENT_CLINIC_OR_DEPARTMENT_OTHER): Payer: Self-pay

## 2022-11-12 ENCOUNTER — Other Ambulatory Visit: Payer: Self-pay | Admitting: Physician Assistant

## 2022-11-14 ENCOUNTER — Other Ambulatory Visit (HOSPITAL_BASED_OUTPATIENT_CLINIC_OR_DEPARTMENT_OTHER): Payer: Self-pay

## 2022-11-14 MED ORDER — METHOTREXATE SODIUM CHEMO INJECTION 50 MG/2ML
12.5000 mg | INTRAMUSCULAR | 0 refills | Status: DC
  Filled 2022-11-14: qty 6, 84d supply, fill #0

## 2022-11-14 NOTE — Telephone Encounter (Signed)
Next Visit: 11/21/2022   Last Visit: 06/14/2022   Last Fill: 08/16/2022   DX: Rheumatoid arthritis involving multiple sites with positive rheumatoid factor    Current Dose per office note 06/14/2022: Methotrexate 0.5 mL subcutaneous every 7 days    Labs: 08/09/2022 CBC and CMP are normal.  Patient to update labs at her upcoming appointment on 11/21/2022   Okay to refill MTX?

## 2022-11-21 ENCOUNTER — Ambulatory Visit: Payer: Commercial Managed Care - PPO | Admitting: Rheumatology

## 2022-11-21 DIAGNOSIS — M0579 Rheumatoid arthritis with rheumatoid factor of multiple sites without organ or systems involvement: Secondary | ICD-10-CM

## 2022-11-21 DIAGNOSIS — Z8719 Personal history of other diseases of the digestive system: Secondary | ICD-10-CM

## 2022-11-21 DIAGNOSIS — R5383 Other fatigue: Secondary | ICD-10-CM

## 2022-11-21 DIAGNOSIS — M5030 Other cervical disc degeneration, unspecified cervical region: Secondary | ICD-10-CM

## 2022-11-21 DIAGNOSIS — M19041 Primary osteoarthritis, right hand: Secondary | ICD-10-CM

## 2022-11-21 DIAGNOSIS — E785 Hyperlipidemia, unspecified: Secondary | ICD-10-CM

## 2022-11-21 DIAGNOSIS — M170 Bilateral primary osteoarthritis of knee: Secondary | ICD-10-CM

## 2022-11-21 DIAGNOSIS — F5101 Primary insomnia: Secondary | ICD-10-CM

## 2022-11-21 DIAGNOSIS — M8589 Other specified disorders of bone density and structure, multiple sites: Secondary | ICD-10-CM

## 2022-11-21 DIAGNOSIS — Z79899 Other long term (current) drug therapy: Secondary | ICD-10-CM

## 2022-11-21 DIAGNOSIS — M19072 Primary osteoarthritis, left ankle and foot: Secondary | ICD-10-CM

## 2022-11-21 DIAGNOSIS — H3581 Retinal edema: Secondary | ICD-10-CM

## 2022-11-21 NOTE — Progress Notes (Signed)
Office Visit Note  Patient: Gina Mitchell             Date of Birth: [birth year]           MRN: [id number]             PCP: Shirline Frees, MD Referring: Shirline Frees, MD Visit Date: 12/05/2022 Occupation: '@GUAROCC'$ @  Subjective:  Medication management  History of Present Illness: Gina Mitchell is a 62 y.o. female with history of seronegative rheumatoid arthritis, osteoarthritis and degenerative disc disease.  She states she has intermittent discomfort in her joints but she has not noticed any joint swelling.  She notices discomfort in her knee joints.  She continues to have some fatigue from methotrexate.  She has been thinking methotrexate 0.5 mL subcu weekly along with folic acid.  She has been under a lot of stress from her work and taking care of her husband.    Activities of Daily Living:  Patient reports morning stiffness for 0 minute.   Patient Denies nocturnal pain.  Difficulty dressing/grooming: Denies Difficulty climbing stairs: Denies Difficulty getting out of chair: Denies Difficulty using hands for taps, buttons, cutlery, and/or writing: Denies  Review of Systems  Constitutional:  Positive for fatigue.  HENT:  Negative for mouth dryness.   Eyes:  Positive for dryness.  Respiratory:  Negative for shortness of breath.   Cardiovascular:  Negative for chest pain and palpitations.  Gastrointestinal:  Positive for constipation. Negative for blood in stool and diarrhea.  Endocrine: Negative for increased urination.  Genitourinary:  Negative for involuntary urination.  Musculoskeletal:  Positive for muscle tenderness. Negative for joint pain, gait problem, joint pain, joint swelling, myalgias, muscle weakness, morning stiffness and myalgias.  Skin:  Positive for rash and sensitivity to sunlight. Negative for color change and hair loss.       Rosacea  Allergic/Immunologic: Negative for susceptible to infections.  Neurological:  Positive for headaches. Negative for  dizziness.  Hematological:  Negative for swollen glands.  Psychiatric/Behavioral:  Positive for sleep disturbance. Negative for depressed mood. The patient is not nervous/anxious.     PMFS History:  Patient Active Problem List   Diagnosis Date Noted   Primary osteoarthritis of both hands 09/08/2017   Primary osteoarthritis of both feet 09/08/2017   Primary osteoarthritis of both knees 09/08/2017   History of gastroesophageal reflux (GERD) 09/08/2017   Dyslipidemia 09/08/2017   Osteopenia of multiple sites 09/08/2017   Right ankle swelling 06/26/2017   Rheumatoid arthritis ([place]) 06/26/2017   DDD (degenerative disc disease), cervical 09/13/2016   Localized primary carpometacarpal osteoarthritis, right 08/26/2016   Rotator cuff dysfunction, right 08/26/2016   Metatarsalgia with a left third MTP synovitis 08/01/2016   Metatarsal stress fracture of left foot 04/19/2016   Abnormal EKG 11/06/2014   Chest pain 06/10/2014   Plantar fasciitis, bilateral 11/25/2013    Past Medical History:  Diagnosis Date   Allergic rhinitis    Esophageal reflux    Incomplete right bundle branch block    Rheumatoid arthritis ([place])    Rosacea    Situational anxiety     Family History  Problem Relation Age of Onset   Prostate cancer Father    Arrhythmia Father    Breast cancer Sister    Hypertension Mother    Past Surgical History:  Procedure Laterality Date   BREAST EXCISIONAL BIOPSY     Breast mass removal     Social History   Social History Narrative   Not on file  Immunization History  Administered Date(s) Administered   PFIZER(Purple Top)SARS-COV-2 Vaccination 11/25/2019, 12/16/2019, 11/12/2020     Objective: Vital Signs: BP 111/77 (BP Location: Left Arm, Patient Position: Sitting, Cuff Size: Small)   Pulse 67   Resp 16   Ht '5\' 2"'$  (1.575 m)   Wt 129 lb (58.5 kg)   BMI 23.59 kg/m    Physical Exam Vitals and nursing note reviewed.  Constitutional:      Appearance: She is  well-developed.  HENT:     Head: Normocephalic and atraumatic.  Eyes:     Conjunctiva/sclera: Conjunctivae normal.  Cardiovascular:     Rate and Rhythm: Normal rate and regular rhythm.     Heart sounds: Normal heart sounds.  Pulmonary:     Effort: Pulmonary effort is normal.     Breath sounds: Normal breath sounds.  Abdominal:     General: Bowel sounds are normal.     Palpations: Abdomen is soft.  Musculoskeletal:     Cervical back: Normal range of motion.  Lymphadenopathy:     Cervical: No cervical adenopathy.  Skin:    General: Skin is warm and dry.     Capillary Refill: Capillary refill takes less than 2 seconds.  Neurological:     Mental Status: She is alert and oriented to person, place, and time.  Psychiatric:        Behavior: Behavior normal.      Musculoskeletal Exam: Cervical, thoracic and lumbar spine were in good range of motion.  Shoulders, elbows, wrists, MCPs PIPs and DIPs been good range of motion with no synovitis.  Bilateral first PIP joint thickening was noted.  Some DIP thickening was noted.  Hip joints and knee joints were in good range of motion.  There was no tenderness over ankles or MTPs.  CDAI Exam: CDAI Score: -- Patient Global: 2 mm; Provider Global: 2 mm Swollen: --; Tender: -- Joint Exam 12/05/2022   No joint exam has been documented for this visit   There is currently no information documented on the homunculus. Go to the Rheumatology activity and complete the homunculus joint exam.  Investigation: No additional findings.  Imaging: No results found.  Recent Labs: Lab Results  Component Value Date   WBC 6.1 11/29/2022   HGB 14.4 11/29/2022   PLT 239 11/29/2022   NA 139 11/29/2022   K 4.6 11/29/2022   CL 104 11/29/2022   CO2 28 11/29/2022   GLUCOSE 78 11/29/2022   BUN 21 11/29/2022   CREATININE 0.78 11/29/2022   BILITOT 0.5 11/29/2022   ALKPHOS 84 04/07/2022   AST 27 11/29/2022   ALT 19 11/29/2022   PROT 6.7 11/29/2022    ALBUMIN 4.4 04/07/2022   CALCIUM 9.1 11/29/2022   GFRAA 92 03/01/2021   QFTBGOLD NEGATIVE 08/14/2017    Speciality Comments: No specialty comments available.  Procedures:  No procedures performed Allergies: Crab [shellfish allergy] and Flagyl [metronidazole]   Assessment / Plan:     Visit Diagnoses: Rheumatoid arthritis involving multiple sites with positive rheumatoid factor ([place]) -patient continues to have intermittent discomfort.  No synovitis was noted on the examination.  She describes discomfort in her knee joints.  She has been taking methotrexate 0.5 mL subcu weekly.  She does not like taking injections.  She had GI intolerance from oral methotrexate.  X-rays obtained during the last visit in August did not show any radiographic progression.  I will check sedimentation rate with the next labs.  Plan: Sedimentation rate  High risk medication  use - Methotrexate 0.5 mL subcutaneous every 7 days and folic acid 1 mg 1 tablet daily.  Labs obtained in January, CBC with differential and CMP with GFR were within normal limits.  She was advised to get labs every 3 months.  Information on immunization was placed in the AVS.  She was advised to stop methotrexate if she develops an infection resume after the infection resolves.  Primary osteoarthritis of both hands-mild osteoarthritic changes were noted.  No synovitis was noted.  Joint protection muscle strengthening was discussed.  Primary osteoarthritis of both knees-she gives history of discomfort in her knee joints.  No swelling or synovitis was noted.  Lower extremity muscle strengthening exercises were discussed.  Primary osteoarthritis of both feet-no synovitis was noted over ankles or MTPs.  She has intermittent discomfort in her feet.  DDD (degenerative disc disease), cervical-she had good range of motion without discomfort today.  Hair loss-patient states that she has noticed generalized hair thinning over the years.  She is not having  acute hair loss.  Use of over-the-counter Rogaine and biotin was discussed.  Side effects were reviewed.  Muscle cramps-she has been experiencing muscle cramps at night.  Use of magnesium supplement was discussed.  Side effects were reviewed.  Primary insomnia-she continues to have some insomnia due to stressful family situation.  Other fatigue-related to methotrexate and insomnia.  Osteopenia of multiple sites - DEXA is done by her PCP.  Dyslipidemia  History of gastroesophageal reflux (GERD)  Retinal edema - she followed by Dr. Manuella Ghazi.  According to the patient the retinal edema resolved.  Orders: Orders Placed This Encounter  Procedures   Sedimentation rate   No orders of the defined types were placed in this encounter.   Follow-Up Instructions: Return in about 5 months (around 05/05/2023) for Rheumatoid arthritis.   Bo Merino, MD  Note - This record has been created using Editor, commissioning.  Chart creation errors have been sought, but may not always  have been located. Such creation errors do not reflect on  the standard of medical care.

## 2022-11-25 ENCOUNTER — Other Ambulatory Visit (HOSPITAL_BASED_OUTPATIENT_CLINIC_OR_DEPARTMENT_OTHER): Payer: Self-pay

## 2022-11-29 ENCOUNTER — Other Ambulatory Visit: Payer: Self-pay | Admitting: *Deleted

## 2022-11-29 DIAGNOSIS — Z79899 Other long term (current) drug therapy: Secondary | ICD-10-CM

## 2022-11-30 LAB — COMPLETE METABOLIC PANEL WITH GFR
AG Ratio: 1.5 (calc) (ref 1.0–2.5)
ALT: 19 U/L (ref 6–29)
AST: 27 U/L (ref 10–35)
Albumin: 4 g/dL (ref 3.6–5.1)
Alkaline phosphatase (APISO): 74 U/L (ref 37–153)
BUN: 21 mg/dL (ref 7–25)
CO2: 28 mmol/L (ref 20–32)
Calcium: 9.1 mg/dL (ref 8.6–10.4)
Chloride: 104 mmol/L (ref 98–110)
Creat: 0.78 mg/dL (ref 0.50–1.05)
Globulin: 2.7 g/dL (calc) (ref 1.9–3.7)
Glucose, Bld: 78 mg/dL (ref 65–99)
Potassium: 4.6 mmol/L (ref 3.5–5.3)
Sodium: 139 mmol/L (ref 135–146)
Total Bilirubin: 0.5 mg/dL (ref 0.2–1.2)
Total Protein: 6.7 g/dL (ref 6.1–8.1)
eGFR: 86 mL/min/{1.73_m2} (ref >=60)

## 2022-11-30 LAB — CBC WITH DIFFERENTIAL/PLATELET
Absolute Monocytes: 372 cells/uL (ref 200–950)
Basophils Absolute: 61 cells/uL (ref 0–200)
Basophils Relative: 1 %
Eosinophils Absolute: 159 cells/uL (ref 15–500)
Eosinophils Relative: 2.6 %
HCT: 41.7 % (ref 35.0–45.0)
Hemoglobin: 14.4 g/dL (ref 11.7–15.5)
Lymphs Abs: 1830 cells/uL (ref 850–3900)
MCH: 32.3 pg (ref 27.0–33.0)
MCHC: 34.5 g/dL (ref 32.0–36.0)
MCV: 93.5 fL (ref 80.0–100.0)
MPV: 10 fL (ref 7.5–12.5)
Monocytes Relative: 6.1 %
Neutro Abs: 3678 cells/uL (ref 1500–7800)
Neutrophils Relative %: 60.3 %
Platelets: 239 10*3/uL (ref 140–400)
RBC: 4.46 10*6/uL (ref 3.80–5.10)
RDW: 12.9 % (ref 11.0–15.0)
Total Lymphocyte: 30 %
WBC: 6.1 10*3/uL (ref 3.8–10.8)

## 2022-11-30 NOTE — Progress Notes (Signed)
CBC and CMP normal

## 2022-12-02 ENCOUNTER — Other Ambulatory Visit (HOSPITAL_BASED_OUTPATIENT_CLINIC_OR_DEPARTMENT_OTHER): Payer: Self-pay

## 2022-12-05 ENCOUNTER — Ambulatory Visit: Payer: Commercial Managed Care - PPO | Attending: Rheumatology | Admitting: Rheumatology

## 2022-12-05 ENCOUNTER — Encounter: Payer: Self-pay | Admitting: Rheumatology

## 2022-12-05 VITALS — BP 111/77 | HR 67 | Resp 16 | Ht 62.0 in | Wt 129.0 lb

## 2022-12-05 DIAGNOSIS — E785 Hyperlipidemia, unspecified: Secondary | ICD-10-CM | POA: Diagnosis not present

## 2022-12-05 DIAGNOSIS — M19041 Primary osteoarthritis, right hand: Secondary | ICD-10-CM | POA: Diagnosis not present

## 2022-12-05 DIAGNOSIS — Z79899 Other long term (current) drug therapy: Secondary | ICD-10-CM

## 2022-12-05 DIAGNOSIS — H3581 Retinal edema: Secondary | ICD-10-CM

## 2022-12-05 DIAGNOSIS — M19071 Primary osteoarthritis, right ankle and foot: Secondary | ICD-10-CM | POA: Diagnosis not present

## 2022-12-05 DIAGNOSIS — M0579 Rheumatoid arthritis with rheumatoid factor of multiple sites without organ or systems involvement: Secondary | ICD-10-CM

## 2022-12-05 DIAGNOSIS — M5030 Other cervical disc degeneration, unspecified cervical region: Secondary | ICD-10-CM

## 2022-12-05 DIAGNOSIS — Z8719 Personal history of other diseases of the digestive system: Secondary | ICD-10-CM

## 2022-12-05 DIAGNOSIS — L659 Nonscarring hair loss, unspecified: Secondary | ICD-10-CM

## 2022-12-05 DIAGNOSIS — M19042 Primary osteoarthritis, left hand: Secondary | ICD-10-CM

## 2022-12-05 DIAGNOSIS — F5101 Primary insomnia: Secondary | ICD-10-CM | POA: Diagnosis not present

## 2022-12-05 DIAGNOSIS — M19072 Primary osteoarthritis, left ankle and foot: Secondary | ICD-10-CM

## 2022-12-05 DIAGNOSIS — R5383 Other fatigue: Secondary | ICD-10-CM | POA: Diagnosis not present

## 2022-12-05 DIAGNOSIS — M8589 Other specified disorders of bone density and structure, multiple sites: Secondary | ICD-10-CM

## 2022-12-05 DIAGNOSIS — M170 Bilateral primary osteoarthritis of knee: Secondary | ICD-10-CM | POA: Diagnosis not present

## 2022-12-05 DIAGNOSIS — R252 Cramp and spasm: Secondary | ICD-10-CM

## 2022-12-05 NOTE — Patient Instructions (Signed)
.  Standing Labs We placed an order today for your standing lab work.   Please have your standing labs drawn in  April and every 3 months  Please have your labs drawn 2 weeks prior to your appointment so that the provider can discuss your lab results at your appointment.  Please note that you may see your imaging and lab results in [facility] before we have reviewed them. We will contact you once all results are reviewed. Please allow our office up to 72 hours to thoroughly review all of the results before contacting the office for clarification of your results.  Lab hours are:   Monday through Thursday from 8:00 am -12:30 pm and 1:00 pm-5:00 pm and Friday from 8:00 am-12:00 pm.  Please be advised, all patients with office appointments requiring lab work will take precedent over walk-in lab work.   Labs are drawn by [facility]. Please bring your co-pay at the time of your lab draw.  You may receive a bill from [facility] for your lab work.  Please note if you are on Hydroxychloroquine and and an order has been placed for a Hydroxychloroquine level, you will need to have it drawn 4 hours or more after your last dose.  If you wish to have your labs drawn at another location, please call the office 24 hours in advance so we can fax the orders.  The office is located at [address] [place], [place], [place] [address] No appointment is necessary.    If you have any questions regarding directions or hours of operation,  please call [phone].   As a reminder, please drink plenty of water prior to coming for your lab work. Thanks!   Vaccines You are taking a medication(s) that can suppress your immune system.  The following immunizations are recommended: Flu annually Covid-19  RSV Td/Tdap (tetanus, diphtheria, pertussis) every 10 years Pneumonia (Prevnar 15 then Pneumovax 23 at least 1 year apart.  Alternatively, can take Prevnar 20 without needing additional dose) Shingrix: 2 doses from 4  weeks to 6 months apart  Please check with your PCP to make sure you are up to date.   If you have signs or symptoms of an infection or start antibiotics: First, call your PCP for workup of your infection. Hold your medication through the infection, until you complete your antibiotics, and until symptoms resolve if you take the following: Injectable medication (Actemra, Benlysta, Cimzia, Cosentyx, Enbrel, Humira, Kevzara, Orencia, Remicade, Simponi, Stelara, Taltz, Tremfya) Methotrexate Leflunomide (Arava) Mycophenolate (Cellcept) Morrie Sheldon, Olumiant, or Rinvoq

## 2022-12-19 ENCOUNTER — Other Ambulatory Visit (HOSPITAL_BASED_OUTPATIENT_CLINIC_OR_DEPARTMENT_OTHER): Payer: Self-pay

## 2022-12-19 ENCOUNTER — Telehealth: Payer: Self-pay | Admitting: *Deleted

## 2022-12-19 DIAGNOSIS — R399 Unspecified symptoms and signs involving the genitourinary system: Secondary | ICD-10-CM | POA: Diagnosis not present

## 2022-12-19 DIAGNOSIS — H9201 Otalgia, right ear: Secondary | ICD-10-CM | POA: Diagnosis not present

## 2022-12-19 DIAGNOSIS — L719 Rosacea, unspecified: Secondary | ICD-10-CM | POA: Diagnosis not present

## 2022-12-19 MED ORDER — DOXYCYCLINE HYCLATE 100 MG PO CAPS
100.0000 mg | ORAL_CAPSULE | Freq: Two times a day (BID) | ORAL | 0 refills | Status: AC
  Filled 2022-12-19: qty 20, 10d supply, fill #0

## 2022-12-19 NOTE — Telephone Encounter (Signed)
Patient has a cold x 1 day, 99.8 fever last night. Patient called dermatologist, trying to get in as a new patient today (patient has appt scheduled next week) - face is red and swollen from a rosacea flare. Patient was scheduled to take MTX yesterday. Should patient take MTX today?

## 2022-12-19 NOTE — Telephone Encounter (Signed)
Patient advised if she is experiencing symptoms of an infection she will need to hold methotrexate until her symptoms have completely resolved. If her symptoms persist or worsen please advise her to follow up with PCP.     Dr. Martin Majestic prescribed metronidazole cream on 11/07/22 for rosacea. She will need to follow back up with Dr. Martin Majestic.  Patient states she had an allergic reaction to the Metronidazole cream. Patient states that she was then given Clindamycin. Patient states she has put a call into Dr. Martin Majestic but she won't be back into the office until tomorrow. They will call her with what to do.

## 2022-12-19 NOTE — Telephone Encounter (Signed)
If she is experiencing symptoms of an infection she will need to hold methotrexate until her symptoms have completely resolved. If her symptoms persist or worsen please advise her to follow up with PCP.    Dr. Martin Majestic prescribed metronidazole cream on 11/07/22 for rosacea. She will need to follow back up with Dr. Martin Majestic.

## 2022-12-21 DIAGNOSIS — L249 Irritant contact dermatitis, unspecified cause: Secondary | ICD-10-CM | POA: Diagnosis not present

## 2022-12-22 DIAGNOSIS — R059 Cough, unspecified: Secondary | ICD-10-CM | POA: Diagnosis not present

## 2023-01-04 ENCOUNTER — Other Ambulatory Visit (HOSPITAL_BASED_OUTPATIENT_CLINIC_OR_DEPARTMENT_OTHER): Payer: Self-pay

## 2023-01-04 DIAGNOSIS — T7840XA Allergy, unspecified, initial encounter: Secondary | ICD-10-CM | POA: Diagnosis not present

## 2023-01-04 MED ORDER — PREDNISONE 10 MG PO TABS
ORAL_TABLET | ORAL | 0 refills | Status: DC
  Filled 2023-01-04: qty 20, 8d supply, fill #0

## 2023-01-05 ENCOUNTER — Telehealth: Payer: Self-pay

## 2023-01-05 NOTE — Telephone Encounter (Signed)
I consulted with Dr.Deveshwar. Dr. Estanislado Pandy states there is no reason to stop taking her Methotrexate while taking the Prednisone. Contacted patient to advise she can still take her Methotrexate while continuing the Prednisone. Patient verbalized understanding.

## 2023-01-05 NOTE — Telephone Encounter (Signed)
Patient contacted the office and states she has a bad rash on her face. Patient states she went to see a Doctor about the rash. Patient states she was not sure if the rash is rosacea or a rheumatological reaction. Patient states her doctor prescribed her Prednisone. Patient states she started the Prednisone yesterday, 01/04/2023. Patient inquired if she can still take her Methotrexate this weekend. Patient's call back number is [phone]. Please advise. Thanks.

## 2023-01-18 ENCOUNTER — Other Ambulatory Visit (HOSPITAL_BASED_OUTPATIENT_CLINIC_OR_DEPARTMENT_OTHER): Payer: Self-pay

## 2023-01-18 ENCOUNTER — Other Ambulatory Visit: Payer: Self-pay | Admitting: Physician Assistant

## 2023-01-18 MED ORDER — METHOTREXATE SODIUM CHEMO INJECTION 50 MG/2ML
12.5000 mg | INTRAMUSCULAR | 0 refills | Status: DC
  Filled 2023-01-18: qty 6, 84d supply, fill #0
  Filled 2023-01-20: qty 2, 28d supply, fill #0
  Filled 2023-02-24: qty 2, 28d supply, fill #1
  Filled 2023-03-18: qty 2, 28d supply, fill #2

## 2023-01-18 NOTE — Telephone Encounter (Signed)
Last Fill: 11/14/2022  Labs: 11/29/2022 CBC and CMP normal.   Next Visit: 05/22/2023  Last Visit: 12/05/2022  DX: Rheumatoid arthritis involving multiple sites with positive rheumatoid factor   Current Dose per office note 12/05/2022: Methotrexate 0.5 mL subcutaneous every 7 days   Okay to refill Methotrexate?

## 2023-01-20 ENCOUNTER — Other Ambulatory Visit (HOSPITAL_BASED_OUTPATIENT_CLINIC_OR_DEPARTMENT_OTHER): Payer: Self-pay

## 2023-02-10 ENCOUNTER — Encounter: Payer: Self-pay | Admitting: Rheumatology

## 2023-02-10 DIAGNOSIS — M0579 Rheumatoid arthritis with rheumatoid factor of multiple sites without organ or systems involvement: Secondary | ICD-10-CM

## 2023-02-10 DIAGNOSIS — Z79899 Other long term (current) drug therapy: Secondary | ICD-10-CM

## 2023-02-13 NOTE — Telephone Encounter (Signed)
Lab orders added.

## 2023-02-14 ENCOUNTER — Other Ambulatory Visit: Payer: Self-pay | Admitting: *Deleted

## 2023-02-14 ENCOUNTER — Other Ambulatory Visit (HOSPITAL_BASED_OUTPATIENT_CLINIC_OR_DEPARTMENT_OTHER): Payer: Self-pay

## 2023-02-14 DIAGNOSIS — M0579 Rheumatoid arthritis with rheumatoid factor of multiple sites without organ or systems involvement: Secondary | ICD-10-CM

## 2023-02-14 DIAGNOSIS — Z79899 Other long term (current) drug therapy: Secondary | ICD-10-CM

## 2023-02-17 LAB — CBC WITH DIFFERENTIAL/PLATELET
Absolute Monocytes: 384 cells/uL (ref 200–950)
Basophils Absolute: 73 cells/uL (ref 0–200)
Basophils Relative: 1.2 %
Eosinophils Absolute: 159 cells/uL (ref 15–500)
Eosinophils Relative: 2.6 %
HCT: 41.5 % (ref 35.0–45.0)
Hemoglobin: 14.3 g/dL (ref 11.7–15.5)
Lymphs Abs: 2220 cells/uL (ref 850–3900)
MCH: 32.1 pg (ref 27.0–33.0)
MCHC: 34.5 g/dL (ref 32.0–36.0)
MCV: 93 fL (ref 80.0–100.0)
MPV: 10.2 fL (ref 7.5–12.5)
Monocytes Relative: 6.3 %
Neutro Abs: 3264 cells/uL (ref 1500–7800)
Neutrophils Relative %: 53.5 %
Platelets: 247 10*3/uL (ref 140–400)
RBC: 4.46 10*6/uL (ref 3.80–5.10)
RDW: 13.3 % (ref 11.0–15.0)
Total Lymphocyte: 36.4 %
WBC: 6.1 10*3/uL (ref 3.8–10.8)

## 2023-02-17 LAB — ANTI-NUCLEAR AB-TITER (ANA TITER)
ANA TITER: 1:80 {titer} — ABNORMAL HIGH
ANA Titer 1: 1:40 {titer} — ABNORMAL HIGH

## 2023-02-17 LAB — COMPLETE METABOLIC PANEL WITH GFR
AG Ratio: 1.8 (calc) (ref 1.0–2.5)
ALT: 9 U/L (ref 6–29)
AST: 20 U/L (ref 10–35)
Albumin: 4.2 g/dL (ref 3.6–5.1)
Alkaline phosphatase (APISO): 74 U/L (ref 37–153)
BUN: 20 mg/dL (ref 7–25)
CO2: 28 mmol/L (ref 20–32)
Calcium: 9.4 mg/dL (ref 8.6–10.4)
Chloride: 104 mmol/L (ref 98–110)
Creat: 0.86 mg/dL (ref 0.50–1.05)
Globulin: 2.4 g/dL (calc) (ref 1.9–3.7)
Glucose, Bld: 89 mg/dL (ref 65–99)
Potassium: 4.2 mmol/L (ref 3.5–5.3)
Sodium: 140 mmol/L (ref 135–146)
Total Bilirubin: 0.7 mg/dL (ref 0.2–1.2)
Total Protein: 6.6 g/dL (ref 6.1–8.1)
eGFR: 77 mL/min/{1.73_m2} (ref >=60)

## 2023-02-17 LAB — RHEUMATOID FACTOR: Rheumatoid fact SerPl-aCnc: 18 IU/mL — ABNORMAL HIGH (ref <14)

## 2023-02-17 LAB — CYCLIC CITRUL PEPTIDE ANTIBODY, IGG: Cyclic Citrullin Peptide Ab: 125 UNITS — ABNORMAL HIGH

## 2023-02-17 LAB — SEDIMENTATION RATE: Sed Rate: 9 mm/h (ref 0–30)

## 2023-02-17 LAB — C-REACTIVE PROTEIN: CRP: <3 mg/L (ref <8.0)

## 2023-02-17 LAB — ANA: Anti Nuclear Antibody (ANA): POSITIVE — AB

## 2023-02-19 NOTE — Progress Notes (Signed)
Anti-CCP is positive and high titer.  Rheumatoid factors positive, ANA titer is stable.  C-reactive protein and sed rate are normal.  CBC and CMP are normal.

## 2023-02-20 NOTE — Progress Notes (Signed)
I called patient and discussed the results.  I explained that the high anti-CCP titers correlate with the disease process.  Recent rheumatoid arthritis flare would explain the high anti-CCP titer.  Rosacea should not cause elevation of anti-CCP titer.  Patient has been wanting to decrease her methotrexate dose which most likely caused disease flare.  I would recommend increasing methotrexate to 0.7 mL subcu weekly because she had a recent flare.  Patient stated that she is not sure if she really had a flare as the swelling went down after elevating her feet.  She will contact us if she has a flare to increase the dose of methotrexate.

## 2023-02-24 ENCOUNTER — Other Ambulatory Visit (HOSPITAL_BASED_OUTPATIENT_CLINIC_OR_DEPARTMENT_OTHER): Payer: Self-pay

## 2023-02-27 ENCOUNTER — Telehealth: Payer: Self-pay

## 2023-02-27 ENCOUNTER — Other Ambulatory Visit (HOSPITAL_BASED_OUTPATIENT_CLINIC_OR_DEPARTMENT_OTHER): Payer: Self-pay

## 2023-02-27 DIAGNOSIS — L718 Other rosacea: Secondary | ICD-10-CM | POA: Diagnosis not present

## 2023-02-27 MED ORDER — DOXYCYCLINE HYCLATE 50 MG PO CAPS
50.0000 mg | ORAL_CAPSULE | Freq: Every day | ORAL | 1 refills | Status: DC
  Filled 2023-02-27: qty 90, 90d supply, fill #0

## 2023-02-27 NOTE — Telephone Encounter (Signed)
Doxycycline can increase methotrexate level and cause increased side effects.  As patient is on lower dose of methotrexate it should be okay for her to take methotrexate with doxycycline.  We will continue to monitor labs.

## 2023-02-27 NOTE — Telephone Encounter (Signed)
Patient called and states she saw her dermatologist today and was prescribed doxycycline 50mg  po qd for Rosacea. The dermatologist would like patient to take the doxycycline for 6 months. Per patient, the dermatologist advised her it was okay to take with her MTX. Patient would like to check with you prior to taking the doxycycline and if it's okay to take with MTX for that duration. Please advise. Thanks!

## 2023-02-27 NOTE — Telephone Encounter (Signed)
I called patient, patient verbalized understanding. 

## 2023-03-06 ENCOUNTER — Other Ambulatory Visit (HOSPITAL_BASED_OUTPATIENT_CLINIC_OR_DEPARTMENT_OTHER): Payer: Self-pay

## 2023-03-06 MED ORDER — ALPRAZOLAM 0.25 MG PO TABS
0.2500 mg | ORAL_TABLET | Freq: Two times a day (BID) | ORAL | 0 refills | Status: DC | PRN
  Filled 2023-03-06: qty 30, 15d supply, fill #0

## 2023-03-07 DIAGNOSIS — M069 Rheumatoid arthritis, unspecified: Secondary | ICD-10-CM | POA: Diagnosis not present

## 2023-03-07 DIAGNOSIS — H2513 Age-related nuclear cataract, bilateral: Secondary | ICD-10-CM | POA: Diagnosis not present

## 2023-03-07 DIAGNOSIS — H3581 Retinal edema: Secondary | ICD-10-CM | POA: Diagnosis not present

## 2023-03-16 ENCOUNTER — Other Ambulatory Visit (HOSPITAL_BASED_OUTPATIENT_CLINIC_OR_DEPARTMENT_OTHER): Payer: Self-pay

## 2023-03-16 MED ORDER — DOXYCYCLINE HYCLATE 20 MG PO TABS
ORAL_TABLET | ORAL | 1 refills | Status: DC
  Filled 2023-03-16: qty 90, 90d supply, fill #0

## 2023-03-18 ENCOUNTER — Other Ambulatory Visit (HOSPITAL_BASED_OUTPATIENT_CLINIC_OR_DEPARTMENT_OTHER): Payer: Self-pay

## 2023-03-20 ENCOUNTER — Other Ambulatory Visit (HOSPITAL_BASED_OUTPATIENT_CLINIC_OR_DEPARTMENT_OTHER): Payer: Self-pay

## 2023-04-05 ENCOUNTER — Other Ambulatory Visit (HOSPITAL_COMMUNITY): Payer: Self-pay

## 2023-04-08 ENCOUNTER — Other Ambulatory Visit: Payer: Self-pay | Admitting: Physician Assistant

## 2023-04-08 ENCOUNTER — Other Ambulatory Visit (HOSPITAL_BASED_OUTPATIENT_CLINIC_OR_DEPARTMENT_OTHER): Payer: Self-pay

## 2023-04-10 ENCOUNTER — Other Ambulatory Visit (HOSPITAL_BASED_OUTPATIENT_CLINIC_OR_DEPARTMENT_OTHER): Payer: Self-pay

## 2023-04-10 MED ORDER — METHOTREXATE SODIUM CHEMO INJECTION 50 MG/2ML
12.5000 mg | INTRAMUSCULAR | 0 refills | Status: DC
  Filled 2023-04-10: qty 2, 28d supply, fill #0
  Filled 2023-05-16: qty 2, 28d supply, fill #1
  Filled 2023-06-16: qty 2, 28d supply, fill #2

## 2023-04-10 NOTE — Telephone Encounter (Signed)
Last Fill: 01/18/2023  Labs: 02/14/2023  CBC and CMP are normal.   Next Visit: 05/22/2023  Last Visit: 12/05/2022  DX:  Rheumatoid arthritis involving multiple sites with positive rheumatoid facto   Current Dose per office note 12/05/2022: Methotrexate 0.5 mL subcutaneous every 7 days   Okay to refill Methotrexate?

## 2023-04-11 ENCOUNTER — Other Ambulatory Visit (HOSPITAL_BASED_OUTPATIENT_CLINIC_OR_DEPARTMENT_OTHER): Payer: Self-pay

## 2023-04-13 ENCOUNTER — Other Ambulatory Visit: Payer: Self-pay | Admitting: Family Medicine

## 2023-04-13 ENCOUNTER — Other Ambulatory Visit (HOSPITAL_BASED_OUTPATIENT_CLINIC_OR_DEPARTMENT_OTHER): Payer: Self-pay

## 2023-04-13 ENCOUNTER — Ambulatory Visit
Admission: RE | Admit: 2023-04-13 | Discharge: 2023-04-13 | Disposition: A | Discharge status: Home / Self Care | Payer: Commercial Managed Care - PPO | Source: Ambulatory Visit | Attending: Family Medicine | Admitting: Family Medicine

## 2023-04-13 DIAGNOSIS — M069 Rheumatoid arthritis, unspecified: Secondary | ICD-10-CM | POA: Diagnosis not present

## 2023-04-13 DIAGNOSIS — M542 Cervicalgia: Secondary | ICD-10-CM | POA: Diagnosis not present

## 2023-04-13 DIAGNOSIS — M25512 Pain in left shoulder: Secondary | ICD-10-CM

## 2023-04-13 DIAGNOSIS — R200 Anesthesia of skin: Secondary | ICD-10-CM

## 2023-04-13 MED ORDER — METHOCARBAMOL 750 MG PO TABS
750.0000 mg | ORAL_TABLET | Freq: Three times a day (TID) | ORAL | 0 refills | Status: DC
  Filled 2023-04-13: qty 30, 10d supply, fill #0

## 2023-05-06 ENCOUNTER — Other Ambulatory Visit (HOSPITAL_BASED_OUTPATIENT_CLINIC_OR_DEPARTMENT_OTHER): Payer: Self-pay

## 2023-05-09 NOTE — Progress Notes (Signed)
Office Visit Note  Patient: Gina Mitchell             Date of Birth: 28-Apr-1961           MRN: [id number]             PCP: Noberto Retort, MD Referring: Noberto Retort, MD Visit Date: 05/22/2023 Occupation: @GUAROCC @  Subjective:  Pain in both knees  History of Present Illness: Gina Mitchell is a 62 y.o. female with seronegative rheumatoid arthritis, osteoarthritis and degenerative disc disease.  She states she was biking and after that she developed pain and discomfort in her bilateral knee joints.  She is also having some popping sensation in her right knee..  She continues to have some discomfort in her ankles and her feet.  None of the other joints are painful.  She has been taking methotrexate 2.5 mL subcu weekly along with folic acid on a regular basis without any interruption.    Activities of Daily Living:  Patient reports morning stiffness for a few minutes.   Patient Reports nocturnal pain.  Difficulty dressing/grooming: Denies Difficulty climbing stairs: Denies Difficulty getting out of chair: Denies Difficulty using hands for taps, buttons, cutlery, and/or writing: Denies  Review of Systems  Constitutional:  Positive for fatigue.  HENT:  Negative for mouth sores and mouth dryness.   Eyes:  Positive for dryness.  Respiratory:  Negative for shortness of breath.   Cardiovascular:  Negative for chest pain and palpitations.  Gastrointestinal:  Positive for constipation. Negative for blood in stool and diarrhea.  Endocrine: Negative for increased urination.  Genitourinary:  Negative for involuntary urination.  Musculoskeletal:  Positive for joint pain, joint pain, joint swelling, myalgias, morning stiffness, muscle tenderness and myalgias. Negative for gait problem and muscle weakness.  Skin:  Positive for hair loss and sensitivity to sunlight. Negative for color change and rash.  Allergic/Immunologic: Negative for susceptible to infections.  Neurological:   Positive for headaches. Negative for dizziness.  Hematological:  Negative for swollen glands.  Psychiatric/Behavioral:  Negative for depressed mood and sleep disturbance. The patient is not nervous/anxious.     PMFS History:  Patient Active Problem List   Diagnosis Date Noted   Primary osteoarthritis of both hands 09/08/2017   Primary osteoarthritis of both feet 09/08/2017   Primary osteoarthritis of both knees 09/08/2017   History of gastroesophageal reflux (GERD) 09/08/2017   Dyslipidemia 09/08/2017   Osteopenia of multiple sites 09/08/2017   Right ankle swelling 06/26/2017   Rheumatoid arthritis (HCC) 06/26/2017   DDD (degenerative disc disease), cervical 09/13/2016   Localized primary carpometacarpal osteoarthritis, right 08/26/2016   Rotator cuff dysfunction, right 08/26/2016   Metatarsalgia with a left third MTP synovitis 08/01/2016   Metatarsal stress fracture of left foot 04/19/2016   Abnormal EKG 11/06/2014   Chest pain 06/10/2014   Plantar fasciitis, bilateral 11/25/2013    Past Medical History:  Diagnosis Date   Allergic rhinitis    Esophageal reflux    Incomplete right bundle branch block    Rheumatoid arthritis (HCC)    Rosacea    Situational anxiety     Family History  Problem Relation Age of Onset   Prostate cancer Father    Arrhythmia Father    Breast cancer Sister    Hypertension Mother    Past Surgical History:  Procedure Laterality Date   BREAST EXCISIONAL BIOPSY     Breast mass removal     Social History   Social History Narrative  Not on file   Immunization History  Administered Date(s) Administered   PFIZER(Purple Top)SARS-COV-2 Vaccination 11/25/2019, 12/16/2019, 11/12/2020     Objective: Vital Signs: BP 125/87 (BP Location: Left Arm, Patient Position: Sitting, Cuff Size: Normal)   Pulse 60   Resp 16   Ht 5\' 2"  (1.575 m)   Wt 126 lb 12.8 oz (57.5 kg)   BMI 23.19 kg/m    Physical Exam Vitals and nursing note reviewed.   Constitutional:      Appearance: She is well-developed.  HENT:     Head: Normocephalic and atraumatic.  Eyes:     Conjunctiva/sclera: Conjunctivae normal.  Cardiovascular:     Rate and Rhythm: Normal rate and regular rhythm.     Heart sounds: Normal heart sounds.  Pulmonary:     Effort: Pulmonary effort is normal.     Breath sounds: Normal breath sounds.  Abdominal:     General: Bowel sounds are normal.     Palpations: Abdomen is soft.  Musculoskeletal:     Cervical back: Normal range of motion.  Lymphadenopathy:     Cervical: No cervical adenopathy.  Skin:    General: Skin is warm and dry.     Capillary Refill: Capillary refill takes less than 2 seconds.  Neurological:     Mental Status: She is alert and oriented to person, place, and time.  Psychiatric:        Behavior: Behavior normal.      Musculoskeletal Exam: Cervical, thoracic and lumbar spine were in good range of motion.  Shoulder joints, elbow joints, wrist joints, MCPs PIPs and DIPs been good range of motion with no synovitis.  Hip joints and knee joints were in good range of motion without any warmth swelling or effusion.  There was no tenderness over ankles or MTPs.  CDAI Exam: CDAI Score: 2.8  Patient Global: 4 / 100; Provider Global: 4 / 100 Swollen: 0 ; Tender: 4  Joint Exam 05/22/2023      Right  Left  Knee   Tender   Tender  Ankle   Tender   Tender     Investigation: No additional findings.  Imaging: No results found.  Recent Labs: Lab Results  Component Value Date   WBC 6.1 02/14/2023   HGB 14.3 02/14/2023   PLT 247 02/14/2023   NA 140 02/14/2023   K 4.2 02/14/2023   CL 104 02/14/2023   CO2 28 02/14/2023   GLUCOSE 89 02/14/2023   BUN 20 02/14/2023   CREATININE 0.86 02/14/2023   BILITOT 0.7 02/14/2023   ALKPHOS 84 04/07/2022   AST 20 02/14/2023   ALT 9 02/14/2023   PROT 6.6 02/14/2023   ALBUMIN 4.4 04/07/2022   CALCIUM 9.4 02/14/2023   GFRAA 92 03/01/2021   QFTBGOLD NEGATIVE  08/14/2017    Speciality Comments: No specialty comments available.  Procedures:  No procedures performed Allergies: Benzoyl peroxide, Crab [shellfish allergy], and Flagyl [metronidazole]   Assessment / Plan:     Visit Diagnoses: Rheumatoid arthritis involving multiple sites with positive rheumatoid factor (HCC) -patient continues to have pain and discomfort in multiple joints.  No synovitis was noted on the examination.  February 14, 2023 sed rate and CRP were normal.  Rheumatoid factor and ANA remain positive.  I discussed the option of increasing the methotrexate dose but she is hesitant to increase the dose of methotrexate.  High risk medication use - Methotrexate 0.5 mL subcutaneous every 7 days and folic acid 1 mg 1 tablet daily.  February 14, 2023 CBC and CMP were normal. - Plan: CBC with Differential/Platelet, COMPLETE METABOLIC PANEL WITH GFR today and every 3 months to monitor for drug toxicity.  Information regarding immunization was placed in the AVS.  She was advised to hold methotrexate if she develops an infection resume after the infection is resolved.  Primary osteoarthritis of both hands-she continues to have pain and stiffness in her bilateral hands.  No synovitis was noted.  Primary osteoarthritis of both knees -patient states that this discomfort in her knee joints started after biking.  No warmth swelling or effusion was noted.  Patient requested x-rays of her bilateral knee joints.  She had mild osteoarthritis on previous x-rays in 2021.  Plan: XR KNEE 3 VIEW RIGHT, XR KNEE 3 VIEW LEFT.  X-rays of bilateral knee joints showed mild osteoarthritis and mild chondromalacia patella.  No radiographic progression was noted when compared to the previous x-rays.  Need for regular exercise was emphasized.  Primary osteoarthritis of both feet-she continues to pain and discomfort in her ankles and her feet.  No synovitis was noted on the examination.  DDD (degenerative disc disease),  cervical-she continues to have stiffness in her cervical spine.  She had good mobility in her cervical spine.  Other medical problems are listed as follows:  Hair loss-no hair thinning was noted.  Primary insomnia  Other fatigue - related to methotrexate and insomnia.  Osteopenia of multiple sites - DEXA is done by her PCP.  Dyslipidemia  Retinal edema - she followed by Dr. Sherryll Burger.  According to the patient the retinal edema resolved.  History of gastroesophageal reflux (GERD)  Orders: Orders Placed This Encounter  Procedures   XR KNEE 3 VIEW RIGHT   XR KNEE 3 VIEW LEFT   CBC with Differential/Platelet   COMPLETE METABOLIC PANEL WITH GFR   No orders of the defined types were placed in this encounter.    Follow-Up Instructions: Return in about 5 months (around 10/22/2023) for Rheumatoid arthritis, Osteoarthritis.   Pollyann Savoy, MD  Note - This record has been created using Animal nutritionist.  Chart creation errors have been sought, but may not always  have been located. Such creation errors do not reflect on  the standard of medical care.

## 2023-05-16 ENCOUNTER — Other Ambulatory Visit: Payer: Self-pay | Admitting: Rheumatology

## 2023-05-16 ENCOUNTER — Other Ambulatory Visit (HOSPITAL_BASED_OUTPATIENT_CLINIC_OR_DEPARTMENT_OTHER): Payer: Self-pay

## 2023-05-16 MED ORDER — FOLIC ACID 1 MG PO TABS
1.0000 mg | ORAL_TABLET | Freq: Every day | ORAL | 3 refills | Status: DC
  Filled 2023-05-16: qty 90, 90d supply, fill #0

## 2023-05-16 NOTE — Telephone Encounter (Signed)
Last Fill: 12/14/2021  Next Visit: 05/22/2023  Last Visit: 12/05/2022  Dx: Rheumatoid arthritis involving multiple sites with positive rheumatoid factor   Current Dose per office note on 12/05/2022: folic acid 1 mg 1 tablet daily   Okay to refill Folic Acid?

## 2023-05-22 ENCOUNTER — Ambulatory Visit: Payer: Commercial Managed Care - PPO | Attending: Rheumatology | Admitting: Rheumatology

## 2023-05-22 ENCOUNTER — Encounter: Payer: Self-pay | Admitting: Rheumatology

## 2023-05-22 ENCOUNTER — Ambulatory Visit (INDEPENDENT_AMBULATORY_CARE_PROVIDER_SITE_OTHER): Payer: Commercial Managed Care - PPO

## 2023-05-22 ENCOUNTER — Ambulatory Visit: Payer: Commercial Managed Care - PPO

## 2023-05-22 VITALS — BP 125/87 | HR 60 | Resp 16 | Ht 62.0 in | Wt 126.8 lb

## 2023-05-22 DIAGNOSIS — M19072 Primary osteoarthritis, left ankle and foot: Secondary | ICD-10-CM

## 2023-05-22 DIAGNOSIS — M19041 Primary osteoarthritis, right hand: Secondary | ICD-10-CM | POA: Diagnosis not present

## 2023-05-22 DIAGNOSIS — F5101 Primary insomnia: Secondary | ICD-10-CM

## 2023-05-22 DIAGNOSIS — R5383 Other fatigue: Secondary | ICD-10-CM | POA: Diagnosis not present

## 2023-05-22 DIAGNOSIS — M0579 Rheumatoid arthritis with rheumatoid factor of multiple sites without organ or systems involvement: Secondary | ICD-10-CM | POA: Diagnosis not present

## 2023-05-22 DIAGNOSIS — R252 Cramp and spasm: Secondary | ICD-10-CM

## 2023-05-22 DIAGNOSIS — M19071 Primary osteoarthritis, right ankle and foot: Secondary | ICD-10-CM | POA: Diagnosis not present

## 2023-05-22 DIAGNOSIS — E785 Hyperlipidemia, unspecified: Secondary | ICD-10-CM

## 2023-05-22 DIAGNOSIS — M5030 Other cervical disc degeneration, unspecified cervical region: Secondary | ICD-10-CM | POA: Diagnosis not present

## 2023-05-22 DIAGNOSIS — M170 Bilateral primary osteoarthritis of knee: Secondary | ICD-10-CM | POA: Diagnosis not present

## 2023-05-22 DIAGNOSIS — M19042 Primary osteoarthritis, left hand: Secondary | ICD-10-CM

## 2023-05-22 DIAGNOSIS — H3581 Retinal edema: Secondary | ICD-10-CM

## 2023-05-22 DIAGNOSIS — Z79899 Other long term (current) drug therapy: Secondary | ICD-10-CM

## 2023-05-22 DIAGNOSIS — H52202 Unspecified astigmatism, left eye: Secondary | ICD-10-CM | POA: Diagnosis not present

## 2023-05-22 DIAGNOSIS — Z8719 Personal history of other diseases of the digestive system: Secondary | ICD-10-CM

## 2023-05-22 DIAGNOSIS — L659 Nonscarring hair loss, unspecified: Secondary | ICD-10-CM

## 2023-05-22 DIAGNOSIS — M8589 Other specified disorders of bone density and structure, multiple sites: Secondary | ICD-10-CM | POA: Diagnosis not present

## 2023-05-22 LAB — CBC WITH DIFFERENTIAL/PLATELET
Absolute Monocytes: 399 cells/uL (ref 200–950)
Basophils Absolute: 40 cells/uL (ref 0–200)
Basophils Relative: 0.7 %
Eosinophils Absolute: 131 cells/uL (ref 15–500)
Eosinophils Relative: 2.3 %
MCH: 31.8 pg (ref 27.0–33.0)
MCHC: 33.9 g/dL (ref 32.0–36.0)
Neutrophils Relative %: 56 %
Platelets: 234 10*3/uL (ref 140–400)
RDW: 12.9 % (ref 11.0–15.0)
Total Lymphocyte: 34 %
WBC: 5.7 10*3/uL (ref 3.8–10.8)

## 2023-05-22 NOTE — Patient Instructions (Signed)
Standing Labs We placed an order today for your standing lab work.   Please have your standing labs drawn in October and every 3 months  Please have your labs drawn 2 weeks prior to your appointment so that the provider can discuss your lab results at your appointment, if possible.  Please note that you may see your imaging and lab results in MyChart before we have reviewed them. We will contact you once all results are reviewed. Please allow our office up to 72 hours to thoroughly review all of the results before contacting the office for clarification of your results.  WALK-IN LAB HOURS  Monday through Thursday from 8:00 am -12:30 pm and 1:00 pm-5:00 pm and Friday from 8:00 am-12:00 pm.  Patients with office visits requiring labs will be seen before walk-in labs.  You may encounter longer than normal wait times. Please allow additional time. Wait times may be shorter on  Monday and Thursday afternoons.  We do not book appointments for walk-in labs. We appreciate your patience and understanding with our staff.   Labs are drawn by [facility]. Please bring your co-pay at the time of your lab draw.  You may receive a bill from [facility] for your lab work.  Please note if you are on Hydroxychloroquine and and an order has been placed for a Hydroxychloroquine level,  you will need to have it drawn 4 hours or more after your last dose.  If you wish to have your labs drawn at another location, please call the office 24 hours in advance so we can fax the orders.  The office is located at [address], [address], [place], [place] [address]   If you have any questions regarding directions or hours of operation,  please call [phone].   As a reminder, please drink plenty of water prior to coming for your lab work. Thanks!   Vaccines You are taking a medication(s) that can suppress your immune system.  The following immunizations are recommended: Flu annually Covid-19  RSV Td/Tdap (tetanus,  diphtheria, pertussis) every 10 years Pneumonia (Prevnar 15 then Pneumovax 23 at least 1 year apart.  Alternatively, can take Prevnar 20 without needing additional dose) Shingrix: 2 doses from 4 weeks to 6 months apart  Please check with your PCP to make sure you are up to date.   If you have signs or symptoms of an infection or start antibiotics: First, call your PCP for workup of your infection. Hold your medication through the infection, until you complete your antibiotics, and until symptoms resolve if you take the following: Injectable medication (Actemra, Benlysta, Cimzia, Cosentyx, Enbrel, Humira, Kevzara, Orencia, Remicade, Simponi, Stelara, Taltz, Tremfya) Methotrexate Leflunomide (Arava) Mycophenolate (Cellcept) Harriette Ohara, Olumiant, or Rinvoq

## 2023-05-23 LAB — COMPLETE METABOLIC PANEL WITH GFR
AG Ratio: 1.8 (calc) (ref 1.0–2.5)
ALT: 9 U/L (ref 6–29)
AST: 21 U/L (ref 10–35)
Albumin: 4.1 g/dL (ref 3.6–5.1)
Alkaline phosphatase (APISO): 75 U/L (ref 37–153)
BUN: 17 mg/dL (ref 7–25)
CO2: 28 mmol/L (ref 20–32)
Calcium: 9.4 mg/dL (ref 8.6–10.4)
Chloride: 104 mmol/L (ref 98–110)
Creat: 0.87 mg/dL (ref 0.50–1.05)
Globulin: 2.3 g/dL (calc) (ref 1.9–3.7)
Glucose, Bld: 68 mg/dL (ref 65–99)
Potassium: 4.8 mmol/L (ref 3.5–5.3)
Sodium: 140 mmol/L (ref 135–146)
Total Bilirubin: 0.4 mg/dL (ref 0.2–1.2)
Total Protein: 6.4 g/dL (ref 6.1–8.1)
eGFR: 76 mL/min/{1.73_m2} (ref >=60)

## 2023-05-23 LAB — CBC WITH DIFFERENTIAL/PLATELET
HCT: 39.8 % (ref 35.0–45.0)
Hemoglobin: 13.5 g/dL (ref 11.7–15.5)
Lymphs Abs: 1938 cells/uL (ref 850–3900)
MCV: 93.9 fL (ref 80.0–100.0)
MPV: 10.2 fL (ref 7.5–12.5)
Monocytes Relative: 7 %
Neutro Abs: 3192 cells/uL (ref 1500–7800)
RBC: 4.24 10*6/uL (ref 3.80–5.10)

## 2023-05-23 NOTE — Progress Notes (Signed)
CBC and CMP normal

## 2023-05-26 ENCOUNTER — Other Ambulatory Visit (HOSPITAL_BASED_OUTPATIENT_CLINIC_OR_DEPARTMENT_OTHER): Payer: Self-pay

## 2023-05-26 ENCOUNTER — Other Ambulatory Visit: Payer: Self-pay | Admitting: Sports Medicine

## 2023-05-26 ENCOUNTER — Ambulatory Visit: Payer: Commercial Managed Care - PPO | Admitting: Sports Medicine

## 2023-05-26 DIAGNOSIS — M170 Bilateral primary osteoarthritis of knee: Secondary | ICD-10-CM | POA: Diagnosis not present

## 2023-05-26 MED ORDER — IBUPROFEN 800 MG PO TABS
800.0000 mg | ORAL_TABLET | Freq: Three times a day (TID) | ORAL | 2 refills | Status: DC | PRN
Start: 2023-05-26 — End: 2023-05-26
  Filled 2023-05-26: qty 90, 30d supply, fill #0

## 2023-05-26 MED ORDER — CELECOXIB 200 MG PO CAPS
200.0000 mg | ORAL_CAPSULE | Freq: Every day | ORAL | 2 refills | Status: DC | PRN
Start: 2023-05-26 — End: 2023-06-30
  Filled 2023-05-26: qty 60, 30d supply, fill #0

## 2023-05-26 NOTE — Assessment & Plan Note (Addendum)
This is a very pleasant 62 year old female, she has had several weeks of increasing pain right worse than left anterior with patellar crepitus after a spin bike session, she saw her rheumatologist, x-rays were obtained that showed patellofemoral chondromalacia and osteoarthritis. She has been using ibuprofen 200. On exam she has mild to moderate effusion on the right side as well as patellar crepitus, knees are stable. Adding Celebrex 200 daily as she does have a history of gastritis, home physical therapy, she will raise her seat on her spin bike so that her knees do not bend past 90 degrees, she will also experiment with feet pointed straight, intoeing and out-toeing to see which feels better for her.  In the meantime I would like her to hold off on all exercise for about a week and she can return to see me in a month. Home PT given.

## 2023-05-26 NOTE — Progress Notes (Addendum)
    Procedures performed today:    None.  Independent interpretation of notes and tests performed by another provider:   None.  Brief History, Exam, Impression, and Recommendations:    Primary osteoarthritis of both knees This is a very pleasant 62 year old female, she has had several weeks of increasing pain right worse than left anterior with patellar crepitus after a spin bike session, she saw her rheumatologist, x-rays were obtained that showed patellofemoral chondromalacia and osteoarthritis. She has been using ibuprofen 200. On exam she has mild to moderate effusion on the right side as well as patellar crepitus, knees are stable. Adding Celebrex 200 daily as she does have a history of gastritis, home physical therapy, she will raise her seat on her spin bike so that her knees do not bend past 90 degrees, she will also experiment with feet pointed straight, intoeing and out-toeing to see which feels better for her.  In the meantime I would like her to hold off on all exercise for about a week and she can return to see me in a month. Home PT given.    ____________________________________________ Ihor Austin. Benjamin Stain, M.D., ABFM., CAQSM., AME. Primary Care and Sports Medicine  [facility]  Adjunct Professor of Family Medicine  [employer] [facility] [employer] [employer] [facility] [employer]  Restaurant manager, fast food

## 2023-05-26 NOTE — Addendum Note (Signed)
Addended by: Monica Becton on: 05/26/2023 04:18 PM   Modules accepted: Orders

## 2023-05-31 ENCOUNTER — Telehealth: Payer: Self-pay

## 2023-05-31 NOTE — Telephone Encounter (Addendum)
It is okay to take Celebrex occasionally.  She may take Celebrex 200 mg every other day temporarily.  Please notify patient that Celebrex can cause elevation in liver function and kidney function.

## 2023-05-31 NOTE — Telephone Encounter (Signed)
Patient advised it is okay to take Celebrex occasionally.  She may take Celebrex 200 mg every other day temporarily.  Patient advised that Celebrex can cause elevation in liver function and kidney function.

## 2023-05-31 NOTE — Telephone Encounter (Signed)
Patient states sports med prescribed celebrex 200-400mg  daily and patient would like to know if she could take that since she takes methotrexate 0.52mL once weekly. Patient started the celebrex on Monday and only took 200mg , patient states she planned on possibly taking the celebrex 200mg  every other day if that is okay to take with the methotrexate. Please advise.

## 2023-05-31 NOTE — Telephone Encounter (Signed)
Please discuss above message with the patient.

## 2023-06-16 ENCOUNTER — Other Ambulatory Visit (HOSPITAL_BASED_OUTPATIENT_CLINIC_OR_DEPARTMENT_OTHER): Payer: Self-pay

## 2023-06-26 ENCOUNTER — Ambulatory Visit: Payer: Commercial Managed Care - PPO | Admitting: Sports Medicine

## 2023-06-30 ENCOUNTER — Other Ambulatory Visit (HOSPITAL_BASED_OUTPATIENT_CLINIC_OR_DEPARTMENT_OTHER): Payer: Self-pay

## 2023-06-30 ENCOUNTER — Encounter: Payer: Self-pay | Admitting: Sports Medicine

## 2023-06-30 ENCOUNTER — Ambulatory Visit: Payer: Commercial Managed Care - PPO | Admitting: Sports Medicine

## 2023-06-30 DIAGNOSIS — M170 Bilateral primary osteoarthritis of knee: Secondary | ICD-10-CM | POA: Diagnosis not present

## 2023-06-30 DIAGNOSIS — L719 Rosacea, unspecified: Secondary | ICD-10-CM

## 2023-06-30 MED ORDER — CELECOXIB 50 MG PO CAPS
50.0000 mg | ORAL_CAPSULE | Freq: Every day | ORAL | 11 refills | Status: DC | PRN
Start: 2023-06-30 — End: 2023-10-10
  Filled 2023-06-30: qty 30, 30d supply, fill #0

## 2023-06-30 MED ORDER — AZELAIC ACID 15 % EX GEL
1.0000 | Freq: Two times a day (BID) | CUTANEOUS | 11 refills | Status: AC
Start: 2023-06-30 — End: ?
  Filled 2023-06-30: qty 50, 30d supply, fill #0

## 2023-06-30 NOTE — Assessment & Plan Note (Signed)
Rosacea, intolerant of metronidazole, currently on doxycycline which is giving her some dyspepsia. Has never tried azelaic acid, we will try this, she can follow this up with her dermatologist. She can also consider laser treatment.

## 2023-06-30 NOTE — Assessment & Plan Note (Signed)
Patellofemoral osteoarthritis, much better with Celebrex and exercises. Celebrex is a bit hard on her stomach so we will drop her down to 50 mg with Pepcid and food. Return to see me as needed. Injections deferred, she does not need them.

## 2023-06-30 NOTE — Progress Notes (Signed)
    Procedures performed today:    None.  Independent interpretation of notes and tests performed by another provider:   None.  Brief History, Exam, Impression, and Recommendations:    Primary osteoarthritis of both knees Patellofemoral osteoarthritis, much better with Celebrex and exercises. Celebrex is a bit hard on her stomach so we will drop her down to 50 mg with Pepcid and food. Return to see me as needed. Injections deferred, she does not need them.  Rosacea Rosacea, intolerant of metronidazole, currently on doxycycline which is giving her some dyspepsia. Has never tried azelaic acid, we will try this, she can follow this up with her dermatologist. She can also consider laser treatment.    ____________________________________________ Ihor Austin. Benjamin Stain, M.D., ABFM., CAQSM., AME. Primary Care and Sports Medicine [place] [facility]  Adjunct Professor of Family Medicine  [employer]  Restaurant manager, fast food

## 2023-07-01 ENCOUNTER — Other Ambulatory Visit (HOSPITAL_BASED_OUTPATIENT_CLINIC_OR_DEPARTMENT_OTHER): Payer: Self-pay

## 2023-07-04 ENCOUNTER — Other Ambulatory Visit: Payer: Self-pay

## 2023-07-17 ENCOUNTER — Telehealth: Payer: Self-pay | Admitting: *Deleted

## 2023-07-17 ENCOUNTER — Other Ambulatory Visit (HOSPITAL_BASED_OUTPATIENT_CLINIC_OR_DEPARTMENT_OTHER): Payer: Self-pay

## 2023-07-17 ENCOUNTER — Other Ambulatory Visit: Payer: Self-pay | Admitting: Physician Assistant

## 2023-07-17 MED ORDER — METHOTREXATE SODIUM CHEMO INJECTION 50 MG/2ML
12.5000 mg | INTRAMUSCULAR | 0 refills | Status: DC
  Filled 2023-07-17: qty 6, 84d supply, fill #0

## 2023-07-17 NOTE — Telephone Encounter (Signed)
Patient contacted the office regarding her Methotrexate prescription. Patient states the pharmacy has only been giving her once vial at a time. Patient advised we send the prescription for 6 mL which should be 3 vials. Patient advised to contact the pharmacy to see why they are only giving her 1 vial at a time. Patient expressed understanding.

## 2023-07-17 NOTE — Telephone Encounter (Signed)
Last Fill: 04/10/2023  Labs: 05/22/2023 CBC and CMP normal.   Next Visit: 10/10/2023  Last Visit: 05/22/2023  DX: Rheumatoid arthritis involving multiple sites with positive rheumatoid factor   Current Dose per office note 05/22/2023: Methotrexate 0.5 mL subcutaneous every 7 days   Okay to refill Methotrexate?

## 2023-08-01 DIAGNOSIS — K5900 Constipation, unspecified: Secondary | ICD-10-CM | POA: Diagnosis not present

## 2023-08-01 DIAGNOSIS — Z139 Encounter for screening, unspecified: Secondary | ICD-10-CM | POA: Diagnosis not present

## 2023-08-01 DIAGNOSIS — Z01419 Encounter for gynecological examination (general) (routine) without abnormal findings: Secondary | ICD-10-CM | POA: Diagnosis not present

## 2023-08-01 DIAGNOSIS — Z1211 Encounter for screening for malignant neoplasm of colon: Secondary | ICD-10-CM | POA: Diagnosis not present

## 2023-08-01 DIAGNOSIS — Z6823 Body mass index (BMI) 23.0-23.9, adult: Secondary | ICD-10-CM | POA: Diagnosis not present

## 2023-08-01 DIAGNOSIS — Z1231 Encounter for screening mammogram for malignant neoplasm of breast: Secondary | ICD-10-CM | POA: Diagnosis not present

## 2023-08-01 DIAGNOSIS — K5909 Other constipation: Secondary | ICD-10-CM | POA: Diagnosis not present

## 2023-08-01 DIAGNOSIS — R350 Frequency of micturition: Secondary | ICD-10-CM | POA: Diagnosis not present

## 2023-08-01 DIAGNOSIS — Z124 Encounter for screening for malignant neoplasm of cervix: Secondary | ICD-10-CM | POA: Diagnosis not present

## 2023-08-29 DIAGNOSIS — L821 Other seborrheic keratosis: Secondary | ICD-10-CM | POA: Diagnosis not present

## 2023-08-29 DIAGNOSIS — D1722 Benign lipomatous neoplasm of skin and subcutaneous tissue of left arm: Secondary | ICD-10-CM | POA: Diagnosis not present

## 2023-08-29 DIAGNOSIS — L718 Other rosacea: Secondary | ICD-10-CM | POA: Diagnosis not present

## 2023-08-29 DIAGNOSIS — D224 Melanocytic nevi of scalp and neck: Secondary | ICD-10-CM | POA: Diagnosis not present

## 2023-08-29 DIAGNOSIS — D2261 Melanocytic nevi of right upper limb, including shoulder: Secondary | ICD-10-CM | POA: Diagnosis not present

## 2023-09-12 DIAGNOSIS — E7800 Pure hypercholesterolemia, unspecified: Secondary | ICD-10-CM | POA: Diagnosis not present

## 2023-09-12 DIAGNOSIS — E559 Vitamin D deficiency, unspecified: Secondary | ICD-10-CM | POA: Diagnosis not present

## 2023-09-12 LAB — LAB REPORT - SCANNED: EGFR: 81

## 2023-09-18 ENCOUNTER — Other Ambulatory Visit: Payer: Self-pay | Admitting: Physician Assistant

## 2023-09-18 ENCOUNTER — Other Ambulatory Visit (HOSPITAL_BASED_OUTPATIENT_CLINIC_OR_DEPARTMENT_OTHER): Payer: Self-pay

## 2023-09-18 DIAGNOSIS — M069 Rheumatoid arthritis, unspecified: Secondary | ICD-10-CM | POA: Diagnosis not present

## 2023-09-18 DIAGNOSIS — E7800 Pure hypercholesterolemia, unspecified: Secondary | ICD-10-CM | POA: Diagnosis not present

## 2023-09-18 DIAGNOSIS — F419 Anxiety disorder, unspecified: Secondary | ICD-10-CM | POA: Diagnosis not present

## 2023-09-18 DIAGNOSIS — Z0000 Encounter for general adult medical examination without abnormal findings: Secondary | ICD-10-CM | POA: Diagnosis not present

## 2023-09-18 DIAGNOSIS — K219 Gastro-esophageal reflux disease without esophagitis: Secondary | ICD-10-CM | POA: Diagnosis not present

## 2023-09-21 ENCOUNTER — Other Ambulatory Visit (HOSPITAL_BASED_OUTPATIENT_CLINIC_OR_DEPARTMENT_OTHER): Payer: Self-pay

## 2023-09-21 ENCOUNTER — Other Ambulatory Visit: Payer: Self-pay | Admitting: Physician Assistant

## 2023-09-21 MED ORDER — METHOTREXATE SODIUM CHEMO INJECTION 50 MG/2ML
12.5000 mg | INTRAMUSCULAR | 0 refills | Status: DC
  Filled 2023-09-21 – 2023-09-22 (×2): qty 2, 28d supply, fill #0

## 2023-09-21 NOTE — Telephone Encounter (Signed)
Attempted to contact the patient and left message to advise patient she is due to update her labs.

## 2023-09-21 NOTE — Telephone Encounter (Signed)
Please advise patient to come in for labs 

## 2023-09-21 NOTE — Telephone Encounter (Signed)
Last Fill: 07/17/2023  Labs: 05/22/2023 CBC and CMP normal.   Next Visit: 10/10/2023  Last Visit: 05/22/2023  DX: Rheumatoid arthritis involving multiple sites with positive rheumatoid factor   Current Dose per office note 05/22/2023: Methotrexate 0.5 mL subcutaneous every 7 days   Okay to refill Methotrexate?

## 2023-09-22 ENCOUNTER — Other Ambulatory Visit (HOSPITAL_BASED_OUTPATIENT_CLINIC_OR_DEPARTMENT_OTHER): Payer: Self-pay

## 2023-09-27 NOTE — Progress Notes (Signed)
Office Visit Note  Patient: Gina Mitchell             Date of Birth: 10/21/1961           MRN: [id number]             PCP: Noberto Retort, MD Referring: Noberto Retort, MD Visit Date: 10/10/2023 Occupation: @GUAROCC @  Subjective:  Medication management  History of Present Illness: Gina Mitchell is a 62 y.o. female with seronegative rheumatoid arthritis, osteoarthritis and degenerative disc disease.  Patient states she has been taking methotrexate 0.5 mL subcutaneously every week along with folic acid 1 mg p.o. daily without any interruption.  She has not had a flare of rheumatoid arthritis.  She states the discomfort in her hands and knee joints is tolerable.  She has not noticed any swelling in her ankles and feet recently.  She continues to have dry eyes.  She complains of occasional sores in her mouth.    Activities of Daily Living:  Patient reports morning stiffness for 0 minutes.   Patient Denies nocturnal pain.  Difficulty dressing/grooming: Denies Difficulty climbing stairs: Denies Difficulty getting out of chair: Denies Difficulty using hands for taps, buttons, cutlery, and/or writing: Denies  Review of Systems  Constitutional:  Positive for fatigue.  HENT:  Positive for mouth sores. Negative for mouth dryness.   Eyes:  Positive for dryness.  Respiratory:  Negative for shortness of breath.   Cardiovascular:  Negative for chest pain and palpitations.  Gastrointestinal:  Positive for constipation. Negative for blood in stool and diarrhea.  Endocrine: Negative for increased urination.  Genitourinary:  Negative for involuntary urination.  Musculoskeletal:  Positive for joint pain, joint pain, myalgias and myalgias. Negative for gait problem, joint swelling, muscle weakness, morning stiffness and muscle tenderness.  Skin:  Negative for color change, rash, hair loss and sensitivity to sunlight.  Allergic/Immunologic: Negative for susceptible to infections.   Neurological:  Positive for headaches. Negative for dizziness.  Hematological:  Negative for swollen glands.  Psychiatric/Behavioral:  Positive for sleep disturbance. Negative for depressed mood. The patient is not nervous/anxious.     PMFS History:  Patient Active Problem List   Diagnosis Date Noted   Rosacea 06/30/2023   Primary osteoarthritis of both hands 09/08/2017   Primary osteoarthritis of both feet 09/08/2017   Primary osteoarthritis of both knees 09/08/2017   History of gastroesophageal reflux (GERD) 09/08/2017   Dyslipidemia 09/08/2017   Osteopenia of multiple sites 09/08/2017   Right ankle swelling 06/26/2017   Rheumatoid arthritis (HCC) 06/26/2017   DDD (degenerative disc disease), cervical 09/13/2016   Localized primary carpometacarpal osteoarthritis, right 08/26/2016   Rotator cuff dysfunction, right 08/26/2016   Metatarsalgia with a left third MTP synovitis 08/01/2016   Metatarsal stress fracture of left foot 04/19/2016   Abnormal EKG 11/06/2014   Chest pain 06/10/2014   Plantar fasciitis, bilateral 11/25/2013    Past Medical History:  Diagnosis Date   Allergic rhinitis    Esophageal reflux    Incomplete right bundle branch block    Rheumatoid arthritis (HCC)    Rosacea    Situational anxiety     Family History  Problem Relation Age of Onset   Prostate cancer Father    Arrhythmia Father    Breast cancer Sister    Hypertension Mother    Past Surgical History:  Procedure Laterality Date   BREAST EXCISIONAL BIOPSY     Breast mass removal     Social History  Social History Narrative   Not on file   Immunization History  Administered Date(s) Administered   PFIZER(Purple Top)SARS-COV-2 Vaccination 11/25/2019, 12/16/2019, 11/12/2020     Objective: Vital Signs: BP 111/78 (BP Location: Left Arm, Patient Position: Sitting, Cuff Size: Normal)   Pulse 69   Resp 15   Ht 5\' 2"  (1.575 m)   Wt 126 lb 9.6 oz (57.4 kg)   BMI 23.16 kg/m    Physical  Exam Vitals and nursing note reviewed.  Constitutional:      Appearance: She is well-developed.  HENT:     Head: Normocephalic and atraumatic.  Eyes:     Conjunctiva/sclera: Conjunctivae normal.  Cardiovascular:     Rate and Rhythm: Normal rate and regular rhythm.     Heart sounds: Normal heart sounds.  Pulmonary:     Effort: Pulmonary effort is normal.     Breath sounds: Normal breath sounds.  Abdominal:     General: Bowel sounds are normal.     Palpations: Abdomen is soft.  Musculoskeletal:     Cervical back: Normal range of motion.  Lymphadenopathy:     Cervical: No cervical adenopathy.  Skin:    General: Skin is warm and dry.     Capillary Refill: Capillary refill takes less than 2 seconds.  Neurological:     Mental Status: She is alert and oriented to person, place, and time.  Psychiatric:        Behavior: Behavior normal.      Musculoskeletal Exam: She had good range of motion of the cervical spine.  There was no tenderness over thoracic or lumbar spine.  Shoulders, elbows, wrist joints, MCPs PIPs and DIPs were in good range of motion with no synovitis.  Hip joints and knee joints with good range of motion without any warmth swelling or effusion.  There was no tenderness over ankles or MTPs.  CDAI Exam: CDAI Score: -- Patient Global: 3 / 100; Provider Global: 3 / 100 Swollen: --; Tender: -- Joint Exam 10/10/2023   No joint exam has been documented for this visit   There is currently no information documented on the homunculus. Go to the Rheumatology activity and complete the homunculus joint exam.  Investigation: No additional findings.  Imaging: No results found.  Recent Labs: Lab Results  Component Value Date   WBC 5.7 05/22/2023   HGB 13.5 05/22/2023   PLT 234 05/22/2023   NA 140 05/22/2023   K 4.8 05/22/2023   CL 104 05/22/2023   CO2 28 05/22/2023   GLUCOSE 68 05/22/2023   BUN 17 05/22/2023   CREATININE 0.87 05/22/2023   BILITOT 0.4 05/22/2023    ALKPHOS 84 04/07/2022   AST 21 05/22/2023   ALT 9 05/22/2023   PROT 6.4 05/22/2023   ALBUMIN 4.4 04/07/2022   CALCIUM 9.4 05/22/2023   GFRAA 92 03/01/2021   QFTBGOLD NEGATIVE 08/14/2017      Speciality Comments: No specialty comments available.  Procedures:  No procedures performed Allergies: Benzoyl peroxide, Crab [shellfish allergy], and Flagyl [metronidazole]   Assessment / Plan:     Visit Diagnoses: Rheumatoid arthritis involving multiple sites with positive rheumatoid factor (HCC) - February 14, 2023 sed rate and CRP were normal.  Rheumatoid factor and ANA remain positive.  Patient denies having a flare since the last visit.  She had no synovitis on the examination.  She continues to take methotrexate 0.5 mL subcu weekly without any interruption.  Patient wants to know if she still have active inflammation.  I will check sed rate with her next labs.  High risk medication use - Methotrexate 0.5 mL subcutaneous every 7 days and folic acid 1 mg 1 tablet daily.  Patient had labs drawn with her PCP at [facility] internal medicine.  I saw the lab results on her cell phone which showed: September 12, 2023 CBC WBC 6.6, hemoglobin 14.3, platelets 245, CMP GFR 81, AST 18, ALT 10.  Labs were reviewed with the patient.  She is supposed to have labs every 3 months.  Information on immunization was placed in the AVS.  She was advised to hold methotrexate if she develops an infection and resume after the infection resolves.  Primary osteoarthritis of both hands-she has bilateral CMC PIP and DIP thickening consistent with osteoarthritis.  Joint protection was discussed.  Primary osteoarthritis of both knees -she has intermittent discomfort in her knee joints.  No warmth swelling or effusion was noted.  X-rays of bilateral knee joints: mild osteoarthritis and mild chondromalacia patella.No radiographic progression was noted when compared to the previous x-rays.  Lower extremity muscle strengthening exercises  were discussed.  Primary osteoarthritis of both feet-she has not had recent flare of arthritis in her ankles.  No synovitis was noted over the ankles or MTPs.  DDD (degenerative disc disease), cervical-she had good range of motion without a stiffness today.  Hair loss-improved.  Other medical problems are listed as follows:  Primary insomnia  Other fatigue  Osteopenia of multiple sites - DEXA is done by her PCP.  Dyslipidemia  Retinal edema - she followed by Dr. Sherryll Burger.  According to the patient the retinal edema resolved.  History of gastroesophageal reflux (GERD)  Orders: No orders of the defined types were placed in this encounter.  Meds ordered this encounter  Medications   folic acid (FOLVITE) 1 MG tablet    Sig: Take 1 tablet (1 mg total) by mouth daily.    Dispense:  90 tablet    Refill:  3      Follow-Up Instructions: Return in about 5 months (around 03/09/2024) for Rheumatoid arthritis, Osteoarthritis.   Pollyann Savoy, MD  Note - This record has been created using Animal nutritionist.  Chart creation errors have been sought, but may not always  have been located. Such creation errors do not reflect on  the standard of medical care.

## 2023-10-10 ENCOUNTER — Ambulatory Visit: Payer: Commercial Managed Care - PPO | Attending: Rheumatology | Admitting: Rheumatology

## 2023-10-10 ENCOUNTER — Other Ambulatory Visit (HOSPITAL_BASED_OUTPATIENT_CLINIC_OR_DEPARTMENT_OTHER): Payer: Self-pay

## 2023-10-10 ENCOUNTER — Encounter: Payer: Self-pay | Admitting: Rheumatology

## 2023-10-10 VITALS — BP 111/78 | HR 69 | Resp 15 | Ht 62.0 in | Wt 126.6 lb

## 2023-10-10 DIAGNOSIS — M8589 Other specified disorders of bone density and structure, multiple sites: Secondary | ICD-10-CM

## 2023-10-10 DIAGNOSIS — M19042 Primary osteoarthritis, left hand: Secondary | ICD-10-CM

## 2023-10-10 DIAGNOSIS — R5383 Other fatigue: Secondary | ICD-10-CM

## 2023-10-10 DIAGNOSIS — E785 Hyperlipidemia, unspecified: Secondary | ICD-10-CM

## 2023-10-10 DIAGNOSIS — F5101 Primary insomnia: Secondary | ICD-10-CM

## 2023-10-10 DIAGNOSIS — Z79899 Other long term (current) drug therapy: Secondary | ICD-10-CM | POA: Diagnosis not present

## 2023-10-10 DIAGNOSIS — L659 Nonscarring hair loss, unspecified: Secondary | ICD-10-CM | POA: Diagnosis not present

## 2023-10-10 DIAGNOSIS — M0579 Rheumatoid arthritis with rheumatoid factor of multiple sites without organ or systems involvement: Secondary | ICD-10-CM | POA: Diagnosis not present

## 2023-10-10 DIAGNOSIS — M5030 Other cervical disc degeneration, unspecified cervical region: Secondary | ICD-10-CM

## 2023-10-10 DIAGNOSIS — M19072 Primary osteoarthritis, left ankle and foot: Secondary | ICD-10-CM

## 2023-10-10 DIAGNOSIS — M19041 Primary osteoarthritis, right hand: Secondary | ICD-10-CM

## 2023-10-10 DIAGNOSIS — M19071 Primary osteoarthritis, right ankle and foot: Secondary | ICD-10-CM | POA: Diagnosis not present

## 2023-10-10 DIAGNOSIS — H3581 Retinal edema: Secondary | ICD-10-CM

## 2023-10-10 DIAGNOSIS — Z8719 Personal history of other diseases of the digestive system: Secondary | ICD-10-CM

## 2023-10-10 DIAGNOSIS — M170 Bilateral primary osteoarthritis of knee: Secondary | ICD-10-CM | POA: Diagnosis not present

## 2023-10-10 MED ORDER — FOLIC ACID 1 MG PO TABS
1.0000 mg | ORAL_TABLET | Freq: Every day | ORAL | 3 refills | Status: AC
  Filled 2023-10-10: qty 90, 90d supply, fill #0
  Filled 2024-03-13: qty 90, 90d supply, fill #1
  Filled 2024-07-19: qty 90, 90d supply, fill #2

## 2023-10-10 NOTE — Patient Instructions (Signed)
Standing Labs We placed an order today for your standing lab work.   Please have your standing labs drawn in February and every 3 months  Please have your labs drawn 2 weeks prior to your appointment so that the provider can discuss your lab results at your appointment, if possible.  Please note that you may see your imaging and lab results in MyChart before we have reviewed them. We will contact you once all results are reviewed. Please allow our office up to 72 hours to thoroughly review all of the results before contacting the office for clarification of your results.  WALK-IN LAB HOURS  Monday through Thursday from 8:00 am -12:30 pm and 1:00 pm-5:00 pm and Friday from 8:00 am-12:00 pm.  Patients with office visits requiring labs will be seen before walk-in labs.  You may encounter longer than normal wait times. Please allow additional time. Wait times may be shorter on  Monday and Thursday afternoons.  We do not book appointments for walk-in labs. We appreciate your patience and understanding with our staff.   Labs are drawn by [facility]. Please bring your co-pay at the time of your lab draw.  You may receive a bill from [facility] for your lab work.  Please note if you are on Hydroxychloroquine and and an order has been placed for a Hydroxychloroquine level,  you will need to have it drawn 4 hours or more after your last dose.  If you wish to have your labs drawn at another location, please call the office 24 hours in advance so we can fax the orders.  The office is located at [address], [address], [place], [place] [address]   If you have any questions regarding directions or hours of operation,  please call [phone].   As a reminder, please drink plenty of water prior to coming for your lab work. Thanks!   Vaccines You are taking a medication(s) that can suppress your immune system.  The following immunizations are recommended: Flu annually Covid-19  RSV Td/Tdap (tetanus,  diphtheria, pertussis) every 10 years Pneumonia (Prevnar 15 then Pneumovax 23 at least 1 year apart.  Alternatively, can take Prevnar 20 without needing additional dose) Shingrix: 2 doses from 4 weeks to 6 months apart If you have signs or symptoms of an infection or start antibiotics: First, call your PCP for workup of your infection. Hold your medication through the infection, until you complete your antibiotics, and until symptoms resolve if you take the following: Injectable medication (Actemra, Benlysta, Cimzia, Cosentyx, Enbrel, Humira, Kevzara, Orencia, Remicade, Simponi, Stelara, Taltz, Tremfya) Methotrexate Leflunomide (Arava) Mycophenolate (Cellcept) [place]borne [place], or Rinvoq   Please check with your PCP to make sure you are up to date.

## 2023-10-16 ENCOUNTER — Ambulatory Visit: Payer: Self-pay | Admitting: Family Medicine

## 2023-10-16 NOTE — Telephone Encounter (Signed)
Copied from CRM [id number]. Topic: Appointments - Appointment Scheduling >> Oct 16, 2023  3:55 PM Whitney O wrote: Patient/patient representative is calling to schedule an appointment. Refer to attachments for appointment information. Patient is calling about neck pain and arm pain and numbness. Patient is needing a appointment sometime this week preferably tomorrow if possible or this week  Chief Complaint: Neck soreness Symptoms: neck soreness and two episodes of numbness in left arm for the past two days only in the morning time after waking up, soreness around both shoulders Frequency: off and on for 2 days Pertinent Negatives: Patient denies chest pain,  Disposition: [] ED /[x] Urgent Care (no appt availability in office) / [x] Appointment(In office/virtual)/ []  [facility]/ [] Home Care/ [] Refused Recommended Disposition /[] [facility]/ []  Follow-up with PCP Additional Notes: Patient advised that she started working out 2 weeks ago and 2 days ago she started having soreness around the back of her neck, both shoulders, and for the past two days she woke up with some numbness/tingling down her left arm that got better once she got up and moving.  She was unsure if it was just because of her positioning when sleeping.  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  Patient denies any numbness at this time, any chest pain, headaches, fevers, or any other neurological symptoms.  She also denies any major known injury.  Patient had an appointment made with the initial call center agent with this office for 10/26/23 and was put on the Waitlist to be seen sooner if possible.  Patient also stated that she called her primary care office and they did not have any appointments any time soon either so that is why she called [place].  She has been taking Motrin and that has been helping some but she still wanted to  get it checked out soon.  Patient is advised that urgent care is recommended at the moment to have a provider be able to assess her since no appointments are immediately available.  She said she would like to wait to hear back from [place] and she was going to call her regular PCP first thing in the morning as well to see if there were any cancellations. Patient is advised to go to urgent care for assessment by a provider and if she chooses not to do this and anything changes or gets worse to go to the emergency room.  Patient verbalized understanding.   Reason for Disposition  Numbness in an arm or hand (i.e., loss of sensation)  Answer Assessment - Initial Assessment Questions 1. ONSET: "When did the pain begin?"      2 days 2. LOCATION: "Where does it hurt?"      Neck pain, near shoulder blade, and off and on pain in left arm. Pt states some pain around left scapula a few weeks ago 3. PATTERN "Does the pain come and go, or has it been constant since it started?"      Comes and goes 4. SEVERITY: "How bad is the pain?"  (Scale 1-10; or mild, moderate, severe)   - NO PAIN (0): no pain or only slight stiffness    - MILD (1-3): doesn't interfere with normal activities    - MODERATE (4-7): interferes with normal activities or awakens from sleep    - SEVERE (8-10):  excruciating pain, unable to do any  normal activities      "Just soreness around shoulders" "Short period of tingling in the back of the neck but then it goes away" "I work on a computer often as well" 5. RADIATION: "Does the pain go anywhere else, shoot into your arms?"     "Only in the mornings the left arm feels numb but it goes away.  Some soreness around shoulders" 6. CORD SYMPTOMS: "Any weakness or numbness of the arms or legs?"     Pt states off and on soreness and some numbness in the left arm but at this time no numbness.  Numbness was mostly in the morning the past two days after sleeping.  Once patient gets up and moving it gets  better 7. CAUSE: "What do you think is causing the neck pain?"     Working out on machines in the gym 8. NECK OVERUSE: "Any recent activities that involved turning or twisting the neck?"     Working out at [employer] and kickboxing at home 9. OTHER SYMPTOMS: "Do you have any other symptoms?" (e.g., headache, fever, chest pain, difficulty breathing, neck swelling)     Denies 10. PREGNANCY: "Is there any chance you are pregnant?" "When was your last menstrual period?"       No  Protocols used: Neck Pain or Stiffness-A-AH

## 2023-10-17 NOTE — Telephone Encounter (Signed)
Spoke with patient. She states she is feeling some better and will wait and watch. She will keep appt schld for 10/26/23. If symptoms change or worsen she will go to urgent care .

## 2023-10-26 ENCOUNTER — Ambulatory Visit: Payer: Commercial Managed Care - PPO | Admitting: Sports Medicine

## 2023-11-06 ENCOUNTER — Other Ambulatory Visit (HOSPITAL_BASED_OUTPATIENT_CLINIC_OR_DEPARTMENT_OTHER): Payer: Self-pay

## 2023-11-06 ENCOUNTER — Other Ambulatory Visit: Payer: Self-pay | Admitting: Rheumatology

## 2023-11-13 ENCOUNTER — Other Ambulatory Visit (HOSPITAL_BASED_OUTPATIENT_CLINIC_OR_DEPARTMENT_OTHER): Payer: Self-pay

## 2023-11-13 ENCOUNTER — Other Ambulatory Visit: Payer: Self-pay | Admitting: *Deleted

## 2023-11-13 MED ORDER — METHOTREXATE SODIUM CHEMO INJECTION 50 MG/2ML
12.5000 mg | INTRAMUSCULAR | 2 refills | Status: DC
  Filled 2023-11-13: qty 2, 28d supply, fill #0
  Filled 2023-12-18: qty 2, 28d supply, fill #1
  Filled 2024-01-11: qty 2, 28d supply, fill #2

## 2023-11-13 NOTE — Telephone Encounter (Signed)
 Patient contacted the office requesting a refill on MTX.  Last Fill: 09/21/2023 (30 day supply)  Labs: September 12, 2023 CBC WBC 6.6, hemoglobin 14.3, platelets 245, CMP GFR 81, AST 18, ALT 10.    Next Visit: 04/08/2024  Last Visit: 10/10/2023  DX: Rheumatoid arthritis involving multiple sites with positive rheumatoid factor   Current Dose per office note 10/10/2023: Methotrexate  0.5 mL subcutaneous every 7 days   Okay to refill Methotrexate ?

## 2023-11-15 ENCOUNTER — Telehealth: Payer: Self-pay | Admitting: *Deleted

## 2023-11-15 NOTE — Telephone Encounter (Signed)
 Labs received from:[facility]  Drawn on:09/12/2023  Reviewed by: Jacinta Martinis, PA-C  Labs drawn: CBC, CMP. Lipid Panel, TSH, Vitamin D   Results: Cholesterol 283   Calc LDL 189   Non HDL 213   Chol/HDL Ratio 4.1  Patient is on MTX 0.5 mL SQ weekly and Folic Acid  1 mg po daily.

## 2023-11-16 ENCOUNTER — Other Ambulatory Visit (HOSPITAL_BASED_OUTPATIENT_CLINIC_OR_DEPARTMENT_OTHER): Payer: Self-pay

## 2023-12-18 ENCOUNTER — Telehealth: Payer: Self-pay | Admitting: Rheumatology

## 2023-12-18 ENCOUNTER — Other Ambulatory Visit (HOSPITAL_BASED_OUTPATIENT_CLINIC_OR_DEPARTMENT_OTHER): Payer: Self-pay

## 2023-12-18 NOTE — Telephone Encounter (Signed)
Patient contacted the office to request a medication refill.   1. Name of Medication: Methotrexate  2. How are you currently taking this medication (dosage and times per day)? 0.48ml    3. What pharmacy would you like for that to be sent to? [facility] [address]

## 2023-12-18 NOTE — Telephone Encounter (Signed)
Reached out to patient to advised she contact the pharmacy for refill. Patient advised she should have one additional refill at the pharmacy. Reminded patient she is due for labs this month.

## 2023-12-28 ENCOUNTER — Other Ambulatory Visit: Payer: Self-pay | Admitting: *Deleted

## 2023-12-28 DIAGNOSIS — Z79899 Other long term (current) drug therapy: Secondary | ICD-10-CM

## 2023-12-28 DIAGNOSIS — M0579 Rheumatoid arthritis with rheumatoid factor of multiple sites without organ or systems involvement: Secondary | ICD-10-CM | POA: Diagnosis not present

## 2023-12-29 LAB — COMPLETE METABOLIC PANEL WITH GFR
AG Ratio: 1.9 (calc) (ref 1.0–2.5)
ALT: 8 U/L (ref 6–29)
AST: 15 U/L (ref 10–35)
Albumin: 4.1 g/dL (ref 3.6–5.1)
Alkaline phosphatase (APISO): 66 U/L (ref 37–153)
BUN: 16 mg/dL (ref 7–25)
CO2: 27 mmol/L (ref 20–32)
Calcium: 9.1 mg/dL (ref 8.6–10.4)
Chloride: 106 mmol/L (ref 98–110)
Creat: 0.69 mg/dL (ref 0.50–1.05)
Globulin: 2.2 g/dL (ref 1.9–3.7)
Glucose, Bld: 90 mg/dL (ref 65–99)
Potassium: 4.4 mmol/L (ref 3.5–5.3)
Sodium: 140 mmol/L (ref 135–146)
Total Bilirubin: 0.6 mg/dL (ref 0.2–1.2)
Total Protein: 6.3 g/dL (ref 6.1–8.1)
eGFR: 98 mL/min/{1.73_m2} (ref >=60)

## 2023-12-29 LAB — CBC WITH DIFFERENTIAL/PLATELET
Absolute Lymphocytes: 2075 {cells}/uL (ref 850–3900)
Absolute Monocytes: 328 {cells}/uL (ref 200–950)
Basophils Absolute: 57 {cells}/uL (ref 0–200)
Basophils Relative: 1.1 %
Eosinophils Absolute: 161 {cells}/uL (ref 15–500)
Eosinophils Relative: 3.1 %
HCT: 38 % (ref 35.0–45.0)
Hemoglobin: 13 g/dL (ref 11.7–15.5)
MCH: 31.9 pg (ref 27.0–33.0)
MCHC: 34.2 g/dL (ref 32.0–36.0)
MCV: 93.4 fL (ref 80.0–100.0)
MPV: 10.1 fL (ref 7.5–12.5)
Monocytes Relative: 6.3 %
Neutro Abs: 2579 {cells}/uL (ref 1500–7800)
Neutrophils Relative %: 49.6 %
Platelets: 232 10*3/uL (ref 140–400)
RBC: 4.07 10*6/uL (ref 3.80–5.10)
RDW: 12.8 % (ref 11.0–15.0)
Total Lymphocyte: 39.9 %
WBC: 5.2 10*3/uL (ref 3.8–10.8)

## 2023-12-29 LAB — SEDIMENTATION RATE: Sed Rate: 11 mm/h (ref 0–30)

## 2023-12-29 NOTE — Progress Notes (Signed)
 CBC and CMP are normal.  Sed rate is normal

## 2024-01-11 ENCOUNTER — Other Ambulatory Visit (HOSPITAL_BASED_OUTPATIENT_CLINIC_OR_DEPARTMENT_OTHER): Payer: Self-pay

## 2024-01-11 ENCOUNTER — Other Ambulatory Visit: Payer: Self-pay | Admitting: Physician Assistant

## 2024-01-11 MED ORDER — METHOTREXATE SODIUM CHEMO INJECTION 50 MG/2ML
12.5000 mg | INTRAMUSCULAR | 0 refills | Status: DC
  Filled 2024-01-11: qty 6, 84d supply, fill #0

## 2024-01-11 NOTE — Telephone Encounter (Signed)
 Last Fill: 11/13/2023  Labs: 12/28/2023 CBC and CMP are normal.  Sed rate is normal   Next Visit: 04/08/2024  Last Visit: 10/10/2023  DX: Rheumatoid arthritis involving multiple sites with positive rheumatoid factor   Current Dose per office note 10/10/2023: Methotrexate 0.5 mL subcutaneous every 7 days   Okay to refill Methotrexate?

## 2024-02-06 DIAGNOSIS — R1084 Generalized abdominal pain: Secondary | ICD-10-CM | POA: Diagnosis not present

## 2024-02-07 ENCOUNTER — Other Ambulatory Visit: Payer: Self-pay | Admitting: Family Medicine

## 2024-02-07 DIAGNOSIS — R1084 Generalized abdominal pain: Secondary | ICD-10-CM

## 2024-02-18 ENCOUNTER — Encounter: Payer: Self-pay | Admitting: Rheumatology

## 2024-02-19 ENCOUNTER — Other Ambulatory Visit

## 2024-02-19 NOTE — Telephone Encounter (Signed)
 Okay for the patient to hold methotrexate  this week if she would like to

## 2024-02-20 ENCOUNTER — Ambulatory Visit
Admission: RE | Admit: 2024-02-20 | Discharge: 2024-02-20 | Disposition: A | Discharge status: Home / Self Care | Source: Ambulatory Visit | Attending: Family Medicine | Admitting: Family Medicine

## 2024-02-20 DIAGNOSIS — R1084 Generalized abdominal pain: Secondary | ICD-10-CM

## 2024-02-20 DIAGNOSIS — R1031 Right lower quadrant pain: Secondary | ICD-10-CM | POA: Diagnosis not present

## 2024-02-20 MED ORDER — IOPAMIDOL (ISOVUE-300) INJECTION 61%
100.0000 mL | Freq: Once | INTRAVENOUS | Status: AC | PRN
  Administered 2024-02-20: 100 mL via INTRAVENOUS

## 2024-03-01 DIAGNOSIS — H5213 Myopia, bilateral: Secondary | ICD-10-CM | POA: Diagnosis not present

## 2024-03-01 DIAGNOSIS — H524 Presbyopia: Secondary | ICD-10-CM | POA: Diagnosis not present

## 2024-03-13 ENCOUNTER — Other Ambulatory Visit (HOSPITAL_BASED_OUTPATIENT_CLINIC_OR_DEPARTMENT_OTHER): Payer: Self-pay

## 2024-03-18 DIAGNOSIS — M069 Rheumatoid arthritis, unspecified: Secondary | ICD-10-CM | POA: Diagnosis not present

## 2024-03-18 DIAGNOSIS — E7800 Pure hypercholesterolemia, unspecified: Secondary | ICD-10-CM | POA: Diagnosis not present

## 2024-03-23 ENCOUNTER — Other Ambulatory Visit (HOSPITAL_BASED_OUTPATIENT_CLINIC_OR_DEPARTMENT_OTHER): Payer: Self-pay

## 2024-03-26 NOTE — Progress Notes (Signed)
 Office Visit Note  Patient: Gina Mitchell             Date of Birth: [birth year]           MRN: [id number]             PCP: Roselind Congo, MD Referring: Roselind Congo, MD Visit Date: 04/08/2024 Occupation: @GUAROCC @  Subjective:  Medication management  History of Present Illness: Gina Mitchell is a 63 y.o. female with seronegative rheumatoid arthritis, osteoarthritis and degenerative disc disease.  She returns today after her last visit in December 2024.  She states she has been taking methotrexate  0.5 mL subcu weekly along with folic acid  1 mg p.o. daily.  She denies having a flare of rheumatoid arthritis since the last visit.  She denies any joint pain and joint swelling.  She continues to have some dry eyes.  She also feels tired due to her busy schedule.    Activities of Daily Living:  Patient reports morning stiffness for 0 minutes.   Patient Denies nocturnal pain.  Difficulty dressing/grooming: Denies Difficulty climbing stairs: Denies Difficulty getting out of chair: Denies Difficulty using hands for taps, buttons, cutlery, and/or writing: Reports  Review of Systems  Constitutional: Negative.  Negative for fatigue.  HENT: Negative.  Negative for mouth sores and mouth dryness.   Eyes:  Positive for dryness.  Respiratory: Negative.  Negative for shortness of breath.   Cardiovascular: Negative.  Negative for chest pain and palpitations.  Gastrointestinal: Negative.  Negative for blood in stool, constipation and diarrhea.  Endocrine: Negative.  Negative for increased urination.  Genitourinary: Negative.  Negative for involuntary urination.  Musculoskeletal:  Positive for joint pain and joint pain. Negative for gait problem, joint swelling, myalgias, muscle weakness, morning stiffness, muscle tenderness and myalgias.  Skin: Negative.  Negative for color change, rash, hair loss and sensitivity to sunlight.  Allergic/Immunologic: Negative.  Negative for susceptible to  infections.  Neurological: Negative.  Negative for dizziness and headaches.  Hematological: Negative.  Negative for swollen glands.  Psychiatric/Behavioral: Negative.  Negative for depressed mood and sleep disturbance. The patient is not nervous/anxious.     PMFS History:  Patient Active Problem List   Diagnosis Date Noted   Rosacea 06/30/2023   Primary osteoarthritis of both hands 09/08/2017   Primary osteoarthritis of both feet 09/08/2017   Primary osteoarthritis of both knees 09/08/2017   History of gastroesophageal reflux (GERD) 09/08/2017   Dyslipidemia 09/08/2017   Osteopenia of multiple sites 09/08/2017   Right ankle swelling 06/26/2017   Rheumatoid arthritis (HCC) 06/26/2017   DDD (degenerative disc disease), cervical 09/13/2016   Localized primary carpometacarpal osteoarthritis, right 08/26/2016   Rotator cuff dysfunction, right 08/26/2016   Metatarsalgia with a left third MTP synovitis 08/01/2016   Metatarsal stress fracture of left foot 04/19/2016   Abnormal EKG 11/06/2014   Chest pain 06/10/2014   Plantar fasciitis, bilateral 11/25/2013    Past Medical History:  Diagnosis Date   Allergic rhinitis    Esophageal reflux    Incomplete right bundle branch block    Rheumatoid arthritis (HCC)    Rosacea    Situational anxiety     Family History  Problem Relation Age of Onset   Prostate cancer Father    Arrhythmia Father    Breast cancer Sister    Hypertension Mother    Past Surgical History:  Procedure Laterality Date   BREAST EXCISIONAL BIOPSY     Breast mass removal  Social History   Social History Narrative   Not on file   Immunization History  Administered Date(s) Administered   PFIZER(Purple Top)SARS-COV-2 Vaccination 11/25/2019, 12/16/2019, 11/12/2020     Objective: Vital Signs: BP 128/86   Pulse (!) 57   Resp 16   Ht 5\' 2"  (1.575 m)   Wt 126 lb 4.8 oz (57.3 kg)   SpO2 99%   BMI 23.10 kg/m    Physical Exam Vitals and nursing note  reviewed.  Constitutional:      Appearance: She is well-developed.  HENT:     Head: Normocephalic and atraumatic.  Eyes:     Conjunctiva/sclera: Conjunctivae normal.  Cardiovascular:     Rate and Rhythm: Normal rate and regular rhythm.     Heart sounds: Normal heart sounds.  Pulmonary:     Effort: Pulmonary effort is normal.     Breath sounds: Normal breath sounds.  Abdominal:     General: Bowel sounds are normal.     Palpations: Abdomen is soft.  Musculoskeletal:     Cervical back: Normal range of motion.  Lymphadenopathy:     Cervical: No cervical adenopathy.  Skin:    General: Skin is warm and dry.     Capillary Refill: Capillary refill takes less than 2 seconds.  Neurological:     Mental Status: She is alert and oriented to person, place, and time.  Psychiatric:        Behavior: Behavior normal.      Musculoskeletal Exam: Cervical, thoracic and lumbar spine good range of motion.  Shoulders, elbows, wrist joints, MCPs PIPs and DIPs with good range of motion with no synovitis.  Hip joints and knee joints in good range of motion without any warmth swelling or effusion.  There was no tenderness over ankles or MTPs  CDAI Exam: CDAI Score: -- Patient Global: --; Provider Global: -- Swollen: --; Tender: -- Joint Exam 04/08/2024   No joint exam has been documented for this visit   There is currently no information documented on the homunculus. Go to the Rheumatology activity and complete the homunculus joint exam.  Investigation: No additional findings.  Imaging: No results found.  Recent Labs: Lab Results  Component Value Date   WBC 5.2 12/28/2023   HGB 13.0 12/28/2023   PLT 232 12/28/2023   NA 140 12/28/2023   K 4.4 12/28/2023   CL 106 12/28/2023   CO2 27 12/28/2023   GLUCOSE 90 12/28/2023   BUN 16 12/28/2023   CREATININE 0.69 12/28/2023   BILITOT 0.6 12/28/2023   ALKPHOS 84 04/07/2022   AST 15 12/28/2023   ALT 8 12/28/2023   PROT 6.3 12/28/2023    ALBUMIN 4.4 04/07/2022   CALCIUM 9.1 12/28/2023   GFRAA 92 03/01/2021   QFTBGOLD NEGATIVE 08/14/2017      Speciality Comments: No specialty comments available.  Procedures:  No procedures performed Allergies: Benzoyl peroxide , Crab [shellfish allergy ], and Flagyl  [metronidazole ]   Assessment / Plan:     Visit Diagnoses: Rheumatoid arthritis involving multiple sites with positive rheumatoid factor (HCC) - February 14, 2023 sed rate and CRP were normal.  Rheumatoid factor and ANA remain positive.  Patient denies having a flare since the last visit.  She states the methotrexate  causes fatigue the next day.  She would like to reduce the dose of methotrexate .  She had no synovitis on the examination today.  We decided to reduce the dose of methotrexate  to 0.4 mL subcu weekly.  A prescription was sent.  High  risk medication use - Methotrexate  0.5 mL subcutaneous every 7 days and folic acid  1 mg 1 tablet daily.  Labs from [facility]: Mar 18, 2024 CMP creatinine 0.80, GFR 83, AST 17, ALT 6, CBC WBC 5.6, hemoglobin 13.7, platelets 204 ,   Primary osteoarthritis of both hands-she had bilateral DIP thickening.  No synovitis was noted on the examination.  Primary osteoarthritis of both knees -she continue to have some stiffness in her knees.  No warmth swelling or effusion was noted.  X-rays of bilateral knee joints: mild osteoarthritis and mild chondromalacia patella.  Primary osteoarthritis of both feet-she denies any discomfort in her feet.  Bilateral bunions were noted.  Proper fitting shoes were advised.  DDD (degenerative disc disease), cervical-she had good range of motion of the cervical spine without any discomfort today.  Hair loss-improved.  Other fatigue-she has a busy work schedule.  She also experiences increased fatigue after the dose of methotrexate .  Primary insomnia-good sleep hygiene was discussed.  Osteopenia of multiple sites - DEXA is done by her PCP.  Dyslipidemia-at  [facility] from May 19, 2025lipid panel LDL 164, HDL 66, total cholesterol 782, triglycerides 98.  Patient is currently not taking any lipid-lowering agents.  She is trying dietary modifications.  Need for regular exercise was emphasized.  Retinal edema - she followed by Dr. Mason Sole.  According to the patient the retinal edema resolved.  Now she is followed by Dr. Alveda Aures.  She reports dry eyes.  Use of frequent eyedrops and blinking was advised as she works on the computer for several hours.  History of gastroesophageal reflux (GERD)-she takes Pepcid  as needed.  Orders: No orders of the defined types were placed in this encounter.  Meds ordered this encounter  Medications   methotrexate  50 MG/2ML injection    Sig: Inject 0.4 mLs (10 mg total) into the skin once a week.    Dispense:  6 mL    Refill:  0     Follow-Up Instructions: Return in about 5 months (around 09/08/2024) for Rheumatoid arthritis.   Nicholas Bari, MD  Note - This record has been created using Animal nutritionist.  Chart creation errors have been sought, but may not always  have been located. Such creation errors do not reflect on  the standard of medical care.

## 2024-04-08 ENCOUNTER — Ambulatory Visit: Payer: Commercial Managed Care - PPO | Attending: Rheumatology | Admitting: Rheumatology

## 2024-04-08 ENCOUNTER — Other Ambulatory Visit (HOSPITAL_BASED_OUTPATIENT_CLINIC_OR_DEPARTMENT_OTHER): Payer: Self-pay

## 2024-04-08 ENCOUNTER — Encounter: Payer: Self-pay | Admitting: Rheumatology

## 2024-04-08 VITALS — BP 128/86 | HR 57 | Resp 16 | Ht 62.0 in | Wt 126.3 lb

## 2024-04-08 DIAGNOSIS — L659 Nonscarring hair loss, unspecified: Secondary | ICD-10-CM | POA: Diagnosis not present

## 2024-04-08 DIAGNOSIS — Z8719 Personal history of other diseases of the digestive system: Secondary | ICD-10-CM

## 2024-04-08 DIAGNOSIS — M19041 Primary osteoarthritis, right hand: Secondary | ICD-10-CM | POA: Diagnosis not present

## 2024-04-08 DIAGNOSIS — M19042 Primary osteoarthritis, left hand: Secondary | ICD-10-CM

## 2024-04-08 DIAGNOSIS — E785 Hyperlipidemia, unspecified: Secondary | ICD-10-CM

## 2024-04-08 DIAGNOSIS — R5383 Other fatigue: Secondary | ICD-10-CM

## 2024-04-08 DIAGNOSIS — F5101 Primary insomnia: Secondary | ICD-10-CM

## 2024-04-08 DIAGNOSIS — M8589 Other specified disorders of bone density and structure, multiple sites: Secondary | ICD-10-CM | POA: Diagnosis not present

## 2024-04-08 DIAGNOSIS — M170 Bilateral primary osteoarthritis of knee: Secondary | ICD-10-CM | POA: Diagnosis not present

## 2024-04-08 DIAGNOSIS — H3581 Retinal edema: Secondary | ICD-10-CM

## 2024-04-08 DIAGNOSIS — Z79899 Other long term (current) drug therapy: Secondary | ICD-10-CM | POA: Diagnosis not present

## 2024-04-08 DIAGNOSIS — M19071 Primary osteoarthritis, right ankle and foot: Secondary | ICD-10-CM

## 2024-04-08 DIAGNOSIS — M19072 Primary osteoarthritis, left ankle and foot: Secondary | ICD-10-CM

## 2024-04-08 DIAGNOSIS — M0579 Rheumatoid arthritis with rheumatoid factor of multiple sites without organ or systems involvement: Secondary | ICD-10-CM

## 2024-04-08 DIAGNOSIS — M5030 Other cervical disc degeneration, unspecified cervical region: Secondary | ICD-10-CM | POA: Diagnosis not present

## 2024-04-08 MED ORDER — METHOTREXATE SODIUM CHEMO INJECTION 50 MG/2ML
10.0000 mg | INTRAMUSCULAR | 0 refills | Status: DC
  Filled 2024-04-08: qty 6, 84d supply, fill #0

## 2024-04-08 NOTE — Patient Instructions (Signed)
 Standing Labs We placed an order today for your standing lab work.   Please have your standing labs drawn in  August and every 3 months  Please have your labs drawn 2 weeks prior to your appointment so that the provider can discuss your lab results at your appointment, if possible.  Please note that you may see your imaging and lab results in MyChart before we have reviewed them. We will contact you once all results are reviewed. Please allow our office up to 72 hours to thoroughly review all of the results before contacting the office for clarification of your results.  WALK-IN LAB HOURS  Monday through Thursday from 8:00 am -12:30 pm and 1:00 pm-4:00 pm and Friday from 8:00 am-12:00 pm.  Patients with office visits requiring labs will be seen before walk-in labs.  You may encounter longer than normal wait times. Please allow additional time. Wait times may be shorter on  Monday and Thursday afternoons.  We do not book appointments for walk-in labs. We appreciate your patience and understanding with our staff.   Labs are drawn by [facility]. Please bring your co-pay at the time of your lab draw.  You may receive a bill from [facility] for your lab work.  Please note if you are on Hydroxychloroquine and and an order has been placed for a Hydroxychloroquine level,  you will need to have it drawn 4 hours or more after your last dose.  If you wish to have your labs drawn at another location, please call the office 24 hours in advance so we can fax the orders.  The office is located at [address], [place], [place] [address]   If you have any questions regarding directions or hours of operation,  please call [phone].   As a reminder, please drink plenty of water  prior to coming for your lab work. Thanks!   Vaccines You are taking a medication(s) that can suppress your immune system.  The following immunizations are recommended: Flu annually Covid-19  RSV Td/Tdap (tetanus,  diphtheria, pertussis) every 10 years Pneumonia (Prevnar 15 then Pneumovax 23 at least 1 year apart.  Alternatively, can take Prevnar 20 without needing additional dose) Shingrix: 2 doses from 4 weeks to 6 months apart  Please check with your PCP to make sure you are up to date.   If you have signs or symptoms of an infection or start antibiotics: First, call your PCP for workup of your infection. Hold your medication through the infection, until you complete your antibiotics, and until symptoms resolve if you take the following: Injectable medication (Actemra, Benlysta, Cimzia, Cosentyx, Enbrel, Humira, Kevzara, Orencia, Remicade, Simponi, Stelara, Taltz, Tremfya) Methotrexate  Leflunomide (Arava) Mycophenolate (Cellcept) Cloria Danger, Olumiant, or Rinvoq

## 2024-04-11 ENCOUNTER — Other Ambulatory Visit (HOSPITAL_BASED_OUTPATIENT_CLINIC_OR_DEPARTMENT_OTHER): Payer: Self-pay

## 2024-04-18 ENCOUNTER — Other Ambulatory Visit (HOSPITAL_BASED_OUTPATIENT_CLINIC_OR_DEPARTMENT_OTHER): Payer: Self-pay

## 2024-04-18 MED ORDER — ALPRAZOLAM 0.25 MG PO TABS
0.2500 mg | ORAL_TABLET | Freq: Two times a day (BID) | ORAL | 0 refills | Status: AC | PRN
  Filled 2024-04-18: qty 30, 15d supply, fill #0

## 2024-04-19 ENCOUNTER — Other Ambulatory Visit: Payer: Self-pay

## 2024-05-24 ENCOUNTER — Other Ambulatory Visit: Payer: Self-pay | Admitting: Obstetrics and Gynecology

## 2024-05-24 DIAGNOSIS — Z1231 Encounter for screening mammogram for malignant neoplasm of breast: Secondary | ICD-10-CM

## 2024-05-27 DIAGNOSIS — K5900 Constipation, unspecified: Secondary | ICD-10-CM | POA: Diagnosis not present

## 2024-05-27 DIAGNOSIS — K9289 Other specified diseases of the digestive system: Secondary | ICD-10-CM | POA: Diagnosis not present

## 2024-06-07 ENCOUNTER — Other Ambulatory Visit (HOSPITAL_BASED_OUTPATIENT_CLINIC_OR_DEPARTMENT_OTHER): Payer: Self-pay

## 2024-06-17 DIAGNOSIS — H6590 Unspecified nonsuppurative otitis media, unspecified ear: Secondary | ICD-10-CM | POA: Diagnosis not present

## 2024-06-19 ENCOUNTER — Telehealth: Payer: Self-pay

## 2024-06-19 NOTE — Telephone Encounter (Signed)
 Patient left a message. Contacted the patient and patient inquires if she is okay to continue her methotrexate  while on Sudafed. Patient states she is taking sudafed because of her sinuses. Patient states she was advised she has no fever, just puffiness and drumming in her ears. Please advise.

## 2024-06-20 NOTE — Telephone Encounter (Signed)
 Attempted to contact the patient and left message for patient to call the office.

## 2024-06-20 NOTE — Telephone Encounter (Signed)
 It is okay to take methotrexate  with Sudafed.

## 2024-06-20 NOTE — Telephone Encounter (Signed)
 Advised patient that per Dr. Dolphus,  It is okay to take methotrexate  with Sudafed.    Patient verbalized understanding.

## 2024-06-24 ENCOUNTER — Other Ambulatory Visit: Payer: Self-pay

## 2024-06-24 DIAGNOSIS — Z79899 Other long term (current) drug therapy: Secondary | ICD-10-CM

## 2024-06-25 ENCOUNTER — Ambulatory Visit: Payer: Self-pay | Admitting: Rheumatology

## 2024-06-25 DIAGNOSIS — H43393 Other vitreous opacities, bilateral: Secondary | ICD-10-CM | POA: Diagnosis not present

## 2024-06-25 LAB — COMPLETE METABOLIC PANEL WITHOUT GFR
AG Ratio: 1.6 (calc) (ref 1.0–2.5)
ALT: 7 U/L (ref 6–29)
AST: 16 U/L (ref 10–35)
Albumin: 4.2 g/dL (ref 3.6–5.1)
Alkaline phosphatase (APISO): 72 U/L (ref 37–153)
BUN: 17 mg/dL (ref 7–25)
CO2: 30 mmol/L (ref 20–32)
Calcium: 9.6 mg/dL (ref 8.6–10.4)
Chloride: 102 mmol/L (ref 98–110)
Creat: 0.82 mg/dL (ref 0.50–1.05)
Globulin: 2.6 g/dL (ref 1.9–3.7)
Glucose, Bld: 84 mg/dL (ref 65–99)
Potassium: 4.8 mmol/L (ref 3.5–5.3)
Sodium: 139 mmol/L (ref 135–146)
Total Bilirubin: 0.5 mg/dL (ref 0.2–1.2)
Total Protein: 6.8 g/dL (ref 6.1–8.1)

## 2024-06-25 LAB — CBC WITH DIFFERENTIAL/PLATELET
Absolute Lymphocytes: 1771 {cells}/uL (ref 850–3900)
Absolute Monocytes: 319 {cells}/uL (ref 200–950)
Basophils Absolute: 50 {cells}/uL (ref 0–200)
Basophils Relative: 0.9 %
Eosinophils Absolute: 193 {cells}/uL (ref 15–500)
Eosinophils Relative: 3.5 %
HCT: 44.1 % (ref 35.0–45.0)
Hemoglobin: 14.7 g/dL (ref 11.7–15.5)
MCH: 32.2 pg (ref 27.0–33.0)
MCHC: 33.3 g/dL (ref 32.0–36.0)
MCV: 96.5 fL (ref 80.0–100.0)
MPV: 9.9 fL (ref 7.5–12.5)
Monocytes Relative: 5.8 %
Neutro Abs: 3168 {cells}/uL (ref 1500–7800)
Neutrophils Relative %: 57.6 %
Platelets: 250 Thousand/uL (ref 140–400)
RBC: 4.57 Million/uL (ref 3.80–5.10)
RDW: 13.2 % (ref 11.0–15.0)
Total Lymphocyte: 32.2 %
WBC: 5.5 Thousand/uL (ref 3.8–10.8)

## 2024-06-25 NOTE — Progress Notes (Signed)
 CBC and CMP normal

## 2024-07-02 ENCOUNTER — Encounter: Payer: Self-pay | Admitting: Sports Medicine

## 2024-07-04 ENCOUNTER — Other Ambulatory Visit (HOSPITAL_BASED_OUTPATIENT_CLINIC_OR_DEPARTMENT_OTHER): Payer: Self-pay

## 2024-07-04 ENCOUNTER — Other Ambulatory Visit: Payer: Self-pay | Admitting: Rheumatology

## 2024-07-04 MED ORDER — METHOTREXATE SODIUM CHEMO INJECTION 50 MG/2ML
10.0000 mg | INTRAMUSCULAR | 0 refills | Status: DC
  Filled 2024-07-04: qty 6, 90d supply, fill #0

## 2024-07-04 NOTE — Telephone Encounter (Signed)
 Last Fill: 04/08/2024  Labs: 06/24/2024 CBC and CMP normal.   Next Visit: 10/14/2024  Last Visit: 04/08/2024  DX: Rheumatoid arthritis involving multiple sites with positive rheumatoid factor   Current Dose per office note 04/08/2024: Methotrexate  0.5 mL subcutaneous every 7 days   Okay to refill Methotrexate ?  Contacted patient to confirm what dose she is taking, patient said she is taking 0.23ml weekly not 0.75ml.

## 2024-07-19 ENCOUNTER — Other Ambulatory Visit (HOSPITAL_BASED_OUTPATIENT_CLINIC_OR_DEPARTMENT_OTHER): Payer: Self-pay

## 2024-08-05 ENCOUNTER — Ambulatory Visit
Admission: RE | Admit: 2024-08-05 | Discharge: 2024-08-05 | Disposition: A | Discharge status: Home / Self Care | Source: Ambulatory Visit | Attending: Obstetrics and Gynecology | Admitting: Obstetrics and Gynecology

## 2024-08-05 DIAGNOSIS — Z1231 Encounter for screening mammogram for malignant neoplasm of breast: Secondary | ICD-10-CM | POA: Diagnosis not present

## 2024-08-06 DIAGNOSIS — Z01419 Encounter for gynecological examination (general) (routine) without abnormal findings: Secondary | ICD-10-CM | POA: Diagnosis not present

## 2024-08-06 DIAGNOSIS — Z780 Asymptomatic menopausal state: Secondary | ICD-10-CM | POA: Diagnosis not present

## 2024-08-06 DIAGNOSIS — M8580 Other specified disorders of bone density and structure, unspecified site: Secondary | ICD-10-CM | POA: Diagnosis not present

## 2024-08-06 DIAGNOSIS — Z1211 Encounter for screening for malignant neoplasm of colon: Secondary | ICD-10-CM | POA: Diagnosis not present

## 2024-08-06 DIAGNOSIS — Z6823 Body mass index (BMI) 23.0-23.9, adult: Secondary | ICD-10-CM | POA: Diagnosis not present

## 2024-08-06 DIAGNOSIS — Z1231 Encounter for screening mammogram for malignant neoplasm of breast: Secondary | ICD-10-CM | POA: Diagnosis not present

## 2024-08-06 DIAGNOSIS — Z1339 Encounter for screening examination for other mental health and behavioral disorders: Secondary | ICD-10-CM | POA: Diagnosis not present

## 2024-08-12 DIAGNOSIS — Z1211 Encounter for screening for malignant neoplasm of colon: Secondary | ICD-10-CM | POA: Diagnosis not present

## 2024-08-16 LAB — COLOGUARD: COLOGUARD: NEGATIVE

## 2024-09-11 ENCOUNTER — Other Ambulatory Visit (HOSPITAL_COMMUNITY): Payer: Self-pay

## 2024-09-12 DIAGNOSIS — Z780 Asymptomatic menopausal state: Secondary | ICD-10-CM | POA: Diagnosis not present

## 2024-09-20 ENCOUNTER — Other Ambulatory Visit (HOSPITAL_BASED_OUTPATIENT_CLINIC_OR_DEPARTMENT_OTHER): Payer: Self-pay

## 2024-09-20 DIAGNOSIS — L821 Other seborrheic keratosis: Secondary | ICD-10-CM | POA: Diagnosis not present

## 2024-09-20 DIAGNOSIS — E559 Vitamin D deficiency, unspecified: Secondary | ICD-10-CM | POA: Diagnosis not present

## 2024-09-20 DIAGNOSIS — E7800 Pure hypercholesterolemia, unspecified: Secondary | ICD-10-CM | POA: Diagnosis not present

## 2024-09-20 DIAGNOSIS — L718 Other rosacea: Secondary | ICD-10-CM | POA: Diagnosis not present

## 2024-09-20 MED ORDER — AZELAIC ACID 15 % EX GEL
CUTANEOUS | 3 refills | Status: AC
  Filled 2024-09-20: qty 50, 30d supply, fill #0

## 2024-09-21 ENCOUNTER — Other Ambulatory Visit (HOSPITAL_BASED_OUTPATIENT_CLINIC_OR_DEPARTMENT_OTHER): Payer: Self-pay

## 2024-09-21 ENCOUNTER — Other Ambulatory Visit: Payer: Self-pay | Admitting: Rheumatology

## 2024-09-23 ENCOUNTER — Other Ambulatory Visit (HOSPITAL_BASED_OUTPATIENT_CLINIC_OR_DEPARTMENT_OTHER): Payer: Self-pay

## 2024-09-23 MED ORDER — METHOTREXATE SODIUM CHEMO INJECTION 50 MG/2ML
10.0000 mg | INTRAMUSCULAR | 0 refills | Status: AC
  Filled 2024-09-23: qty 6, 90d supply, fill #0

## 2024-09-23 NOTE — Telephone Encounter (Signed)
 Last Fill: 07/04/2024  Labs: 06/24/2024 CBC and CMP normal.   Next Visit: 10/14/2024  Last Visit: 04/08/2024  DX: Rheumatoid arthritis involving multiple sites with positive rheumatoid factor   Current Dose per office note on 04/08/2024: Methotrexate  0.5 mL subcutaneous every 7 days. We decided to reduce the dose of methotrexate  to 0.4 mL subcu weekly.   Okay to refill Methotrexate ?

## 2024-09-24 DIAGNOSIS — F419 Anxiety disorder, unspecified: Secondary | ICD-10-CM | POA: Diagnosis not present

## 2024-09-24 DIAGNOSIS — H9071 Mixed conductive and sensorineural hearing loss, unilateral, right ear, with unrestricted hearing on the contralateral side: Secondary | ICD-10-CM | POA: Diagnosis not present

## 2024-09-24 DIAGNOSIS — M069 Rheumatoid arthritis, unspecified: Secondary | ICD-10-CM | POA: Diagnosis not present

## 2024-09-24 DIAGNOSIS — K219 Gastro-esophageal reflux disease without esophagitis: Secondary | ICD-10-CM | POA: Diagnosis not present

## 2024-09-24 DIAGNOSIS — E7800 Pure hypercholesterolemia, unspecified: Secondary | ICD-10-CM | POA: Diagnosis not present

## 2024-09-24 DIAGNOSIS — Z0000 Encounter for general adult medical examination without abnormal findings: Secondary | ICD-10-CM | POA: Diagnosis not present

## 2024-09-30 NOTE — Progress Notes (Unsigned)
 Office Visit Note  Patient: Gina Mitchell             Date of Birth: January 25, 1961           MRN: [id number]             PCP: Arloa Elsie SAUNDERS, MD Referring: Arloa Elsie SAUNDERS, MD Visit Date: 10/14/2024 Occupation: Data Unavailable  Subjective:  No chief complaint on file.   History of Present Illness: Gina Mitchell is a 63 y.o. female ***     Activities of Daily Living:  Patient reports morning stiffness for *** {minute/hour:19697}.   Patient {ACTIONS;DENIES/REPORTS:21021675::Denies} nocturnal pain.  Difficulty dressing/grooming: {ACTIONS;DENIES/REPORTS:21021675::Denies} Difficulty climbing stairs: {ACTIONS;DENIES/REPORTS:21021675::Denies} Difficulty getting out of chair: {ACTIONS;DENIES/REPORTS:21021675::Denies} Difficulty using hands for taps, buttons, cutlery, and/or writing: {ACTIONS;DENIES/REPORTS:21021675::Denies}  No Rheumatology ROS completed.   PMFS History:  Patient Active Problem List   Diagnosis Date Noted   Rosacea 06/30/2023   Primary osteoarthritis of both hands 09/08/2017   Primary osteoarthritis of both feet 09/08/2017   Primary osteoarthritis of both knees 09/08/2017   History of gastroesophageal reflux (GERD) 09/08/2017   Dyslipidemia 09/08/2017   Osteopenia of multiple sites 09/08/2017   Right ankle swelling 06/26/2017   Rheumatoid arthritis (HCC) 06/26/2017   DDD (degenerative disc disease), cervical 09/13/2016   Localized primary carpometacarpal osteoarthritis, right 08/26/2016   Rotator cuff dysfunction, right 08/26/2016   Metatarsalgia with a left third MTP synovitis 08/01/2016   Metatarsal stress fracture of left foot 04/19/2016   Abnormal EKG 11/06/2014   Chest pain 06/10/2014   Plantar fasciitis, bilateral 11/25/2013    Past Medical History:  Diagnosis Date   Allergic rhinitis    Esophageal reflux    Incomplete right bundle branch block    Rheumatoid arthritis (HCC)    Rosacea    Situational anxiety     Family History   Problem Relation Age of Onset   Prostate cancer Father    Arrhythmia Father    Breast cancer Sister    Hypertension Mother    Past Surgical History:  Procedure Laterality Date   BREAST EXCISIONAL BIOPSY     Breast mass removal     Social History   Tobacco Use   Smoking status: Never    Passive exposure: Never   Smokeless tobacco: Never  Vaping Use   Vaping status: Never Used  Substance Use Topics   Alcohol use: No   Drug use: No   Social History   Social History Narrative   Not on file     Immunization History  Administered Date(s) Administered   PFIZER(Purple Top)SARS-COV-2 Vaccination 11/25/2019, 12/16/2019, 11/12/2020     Objective: Vital Signs: There were no vitals taken for this visit.   Physical Exam   Musculoskeletal Exam: ***  CDAI Exam: CDAI Score: -- Patient Global: --; Provider Global: -- Swollen: --; Tender: -- Joint Exam 10/14/2024   No joint exam has been documented for this visit   There is currently no information documented on the homunculus. Go to the Rheumatology activity and complete the homunculus joint exam.  Investigation: No additional findings.  Imaging: No results found.  Recent Labs: Lab Results  Component Value Date   WBC 5.5 06/24/2024   HGB 14.7 06/24/2024   PLT 250 06/24/2024   NA 139 06/24/2024   K 4.8 06/24/2024   CL 102 06/24/2024   CO2 30 06/24/2024   GLUCOSE 84 06/24/2024   BUN 17 06/24/2024   CREATININE 0.82 06/24/2024   BILITOT 0.5 06/24/2024  ALKPHOS 84 04/07/2022   AST 16 06/24/2024   ALT 7 06/24/2024   PROT 6.8 06/24/2024   ALBUMIN 4.4 04/07/2022   CALCIUM 9.6 06/24/2024   GFRAA 92 03/01/2021   QFTBGOLD NEGATIVE 08/14/2017    Speciality Comments: No specialty comments available.  Procedures:  No procedures performed Allergies: Benzoyl peroxide , Crab [shellfish allergy ], and Flagyl  [metronidazole ]   Assessment / Plan:     Visit Diagnoses: No diagnosis found.  Orders: No orders of the  defined types were placed in this encounter.  No orders of the defined types were placed in this encounter.   Face-to-face time spent with patient was *** minutes. Greater than 50% of time was spent in counseling and coordination of care.  Follow-Up Instructions: No follow-ups on file.   Daved JAYSON Gavel, CMA  Note - This record has been created using Animal nutritionist.  Chart creation errors have been sought, but may not always  have been located. Such creation errors do not reflect on  the standard of medical care.

## 2024-10-03 ENCOUNTER — Encounter (INDEPENDENT_AMBULATORY_CARE_PROVIDER_SITE_OTHER): Payer: Self-pay

## 2024-10-14 ENCOUNTER — Encounter: Payer: Self-pay | Admitting: Rheumatology

## 2024-10-14 ENCOUNTER — Ambulatory Visit: Attending: Rheumatology | Admitting: Rheumatology

## 2024-10-14 VITALS — BP 120/81 | HR 66 | Temp 97.1°F | Resp 16 | Ht 62.0 in | Wt 129.2 lb

## 2024-10-14 DIAGNOSIS — M8589 Other specified disorders of bone density and structure, multiple sites: Secondary | ICD-10-CM | POA: Diagnosis not present

## 2024-10-14 DIAGNOSIS — M5030 Other cervical disc degeneration, unspecified cervical region: Secondary | ICD-10-CM | POA: Diagnosis not present

## 2024-10-14 DIAGNOSIS — F5101 Primary insomnia: Secondary | ICD-10-CM | POA: Diagnosis not present

## 2024-10-14 DIAGNOSIS — Z79899 Other long term (current) drug therapy: Secondary | ICD-10-CM

## 2024-10-14 DIAGNOSIS — M170 Bilateral primary osteoarthritis of knee: Secondary | ICD-10-CM | POA: Diagnosis not present

## 2024-10-14 DIAGNOSIS — L659 Nonscarring hair loss, unspecified: Secondary | ICD-10-CM

## 2024-10-14 DIAGNOSIS — R5383 Other fatigue: Secondary | ICD-10-CM | POA: Diagnosis not present

## 2024-10-14 DIAGNOSIS — M0579 Rheumatoid arthritis with rheumatoid factor of multiple sites without organ or systems involvement: Secondary | ICD-10-CM | POA: Diagnosis not present

## 2024-10-14 DIAGNOSIS — H3581 Retinal edema: Secondary | ICD-10-CM | POA: Diagnosis not present

## 2024-10-14 DIAGNOSIS — E785 Hyperlipidemia, unspecified: Secondary | ICD-10-CM | POA: Diagnosis not present

## 2024-10-14 DIAGNOSIS — M19071 Primary osteoarthritis, right ankle and foot: Secondary | ICD-10-CM

## 2024-10-14 DIAGNOSIS — Z8719 Personal history of other diseases of the digestive system: Secondary | ICD-10-CM

## 2024-10-14 DIAGNOSIS — M19072 Primary osteoarthritis, left ankle and foot: Secondary | ICD-10-CM

## 2024-10-14 DIAGNOSIS — M19041 Primary osteoarthritis, right hand: Secondary | ICD-10-CM | POA: Diagnosis not present

## 2024-10-14 DIAGNOSIS — M19042 Primary osteoarthritis, left hand: Secondary | ICD-10-CM

## 2024-10-14 NOTE — Patient Instructions (Signed)
 Standing Labs We placed an order today for your standing lab work.   Please have your standing labs drawn in  February and every 3 months  Please have your labs drawn 2 weeks prior to your appointment so that the provider can discuss your lab results at your appointment, if possible.  Please note that you may see your imaging and lab results in MyChart before we have reviewed them. We will contact you once all results are reviewed. Please allow our office up to 72 hours to thoroughly review all of the results before contacting the office for clarification of your results.  WALK-IN LAB HOURS  Monday through Thursday from 8:00 am - 4:30 pm and Friday from 8:00 am-12:00 pm.  Patients with office visits requiring labs will be seen before walk-in labs.  You may encounter longer than normal wait times. Please allow additional time. Wait times may be shorter on  Monday and Thursday afternoons.  We do not book appointments for walk-in labs. We appreciate your patience and understanding with our staff.   Labs are drawn by [facility]. Please bring your co-pay at the time of your lab draw.  You may receive a bill from [facility] for your lab work.  Please note if you are on Hydroxychloroquine  and and an order has been placed for a Hydroxychloroquine  level,  you will need to have it drawn 4 hours or more after your last dose.  If you wish to have your labs drawn at another location, please call the office 24 hours in advance so we can fax the orders.  The office is located at [address], [place], [place] [address]   If you have any questions regarding directions or hours of operation,  please call [phone].   As a reminder, please drink plenty of water prior to coming for your lab work. Thanks!   Vaccines You are taking a medication(s) that can suppress your immune system.  The following immunizations are recommended: Flu annually RSV Covid-19  Td/Tdap (tetanus, diphtheria,  pertussis) every 10 years Pneumonia (Prevnar 15 then Pneumovax 23 at least 1 year apart.  Alternatively, can take Prevnar 20 without needing additional dose) Shingrix: 2 doses from 4 weeks to 6 months apart  Please check with your PCP to make sure you are up to date.   If you have signs or symptoms of an infection or start antibiotics: First, call your PCP for workup of your infection. Hold your medication through the infection, until you complete your antibiotics, and until symptoms resolve if you take the following: Injectable medication (Actemra , Benlysta, Cimzia, Cosentyx, Enbrel , Humira, Kevzara, Orencia, Remicade, Simponi, Stelara, Taltz, Tremfya) Methotrexate Leflunomide  (Arava ) Mycophenolate (Cellcept) Xeljanz, Olumiant, or Rinvoq

## 2024-11-06 ENCOUNTER — Ambulatory Visit (INDEPENDENT_AMBULATORY_CARE_PROVIDER_SITE_OTHER): Admitting: Otolaryngology

## 2024-11-06 ENCOUNTER — Encounter (INDEPENDENT_AMBULATORY_CARE_PROVIDER_SITE_OTHER): Payer: Self-pay | Admitting: Otolaryngology

## 2024-11-06 VITALS — BP 118/79 | HR 68 | Ht 62.0 in | Wt 126.0 lb

## 2024-11-06 DIAGNOSIS — H9193 Unspecified hearing loss, bilateral: Secondary | ICD-10-CM

## 2024-11-06 DIAGNOSIS — H9313 Tinnitus, bilateral: Secondary | ICD-10-CM

## 2024-11-06 NOTE — Progress Notes (Signed)
 CC: Bilateral tinnitus  Discussed the use of AI scribe software for clinical note transcription with the patient, who gave verbal consent to proceed.  History of Present Illness Gina Mitchell is a 64 year old female who presents for evaluation of intermittent bilateral tinnitus, slightly worse on the right side.  She reports intermittent tinnitus, described as a low-level, high-pitched ringing that is not constant and is often ignorable. She also experiences episodic pulsatile tinnitus, characterized as a bump, bump, bump sensation, which occurs occasionally, particularly during or after physical activity. She sometimes perceives her pulse in the right ear following exercise, but this is not a persistent symptom.  She denies significant hearing loss, though she occasionally questions whether she is hearing ringing or other noises. She does not experience otalgia. She notes occasional imbalance. She denies prior otitis media and has no history of otologic, nasal, or pharyngeal surgery.  She has a history of intermittent sinus symptoms and allergic rhinitis, for which she occasionally takes loratadine. She does not use intranasal corticosteroids and denies current nasal congestion. She notes periorbital edema upon waking, which she attributes to swelling after sleep. She is currently treated for rheumatoid arthritis with weekly low-dose methotrexate  injections and reports mild jaw soreness.   Past Medical History:  Diagnosis Date   Allergic rhinitis    Esophageal reflux    Incomplete right bundle branch block    Rheumatoid arthritis (HCC)    Rosacea    Situational anxiety     Past Surgical History:  Procedure Laterality Date   BREAST EXCISIONAL BIOPSY     Breast mass removal      Family History  Problem Relation Age of Onset   Prostate cancer Father    Arrhythmia Father    Breast cancer Sister    Hypertension Mother     Social History:  reports that she has never  smoked. She has never been exposed to tobacco smoke. She has never used smokeless tobacco. She reports that she does not drink alcohol and does not use drugs.  Allergies: Allergies[1]  Prior to Admission medications  Medication Sig Start Date End Date Taking? Authorizing Provider  Acetaminophen  (TYLENOL  PO) Take by mouth as needed.   Yes [provider]  ALPRAZolam  (XANAX ) 0.25 MG tablet Take 1 tablet (0.25 mg total) by mouth 2 (two) times daily as needed for acute anxiety 04/18/24  Yes   Azelaic Acid  15 % gel Apply 1 Application topically 2 (two) times daily. 06/30/23  Yes Curtis Debby PARAS, MD  Azelaic Acid  15 % gel Apply a small amount to skin twice a day Apply pea size amount to entire face. 09/20/24  Yes   Cholecalciferol (VITAMIN D3 PO) Take by mouth 1 day or 1 dose.   Yes [provider]  Docusate Sodium (COLACE PO) Take by mouth as needed.   Yes [provider]  Famotidine  (PEPCID  PO) Take 20 mg by mouth as needed.    Yes [provider]  methotrexate  50 MG/2ML injection Inject 0.4 mLs (10 mg total) into the skin once a week. Discard each vial 30 days after first use. 09/23/24  Yes Deveshwar, Maya, MD  Probiotic CAPS Probiotic   Yes [provider]  TUBERCULIN SYR 1CC/27GX1/2 (B-D TB SYRINGE 1CC/27GX1/2) 27G X 1/2 1 ML MISC 12 Syringes by Does not apply route once a week. 05/25/22  Yes Deveshwar, Maya, MD    Blood pressure 118/79, pulse 68, height 5' 2 (1.575 m), weight 126 lb (57.2 kg),  SpO2 97%. Exam: General: Communicates without difficulty, well nourished, no acute distress. Head: Normocephalic, no evidence injury, no tenderness, facial buttresses intact without stepoff. Face/sinus: No tenderness to palpation and percussion. Facial movement is normal and symmetric. Eyes: PERRL, EOMI. No scleral icterus, conjunctivae clear. Neuro: CN II exam reveals vision grossly intact.  No nystagmus at any point of gaze. Ears: Auricles well  formed without lesions.  Ear canals are intact without mass or lesion.  No erythema or edema is appreciated.  The TMs are intact without fluid. Nose: External evaluation reveals normal support and skin without lesions.  Dorsum is intact.  Anterior rhinoscopy reveals congested mucosa over anterior aspect of inferior turbinates and intact septum.  No purulence noted. Oral:  Oral cavity and oropharynx are intact, symmetric, without erythema or edema.  Mucosa is moist without lesions. Neck: Full range of motion without pain.  There is no significant lymphadenopathy.  No masses palpable.  Thyroid  bed within normal limits to palpation.  Parotid glands and submandibular glands equal bilaterally without mass.  Trachea is midline. Neuro:  CN 2-12 grossly intact.   Assessment and Plan Assessment & Plan Bilateral tinnitus She experiences intermittent tinnitus with occasional pulsatile symptoms, without pain, infection, or hearing loss on prior testing. Physical examination is normal.  Her tympanic membranes and middle ear spaces are all normal. - Scheduled audiometric evaluation to assess for underlying hearing loss or asymmetry. - Provided education regarding the benign and common nature of intermittent tinnitus. - Recommended non-pharmacologic management strategies, including use of white noise, fan, radio, or noise maker app to mask tinnitus if bothersome. - Advised to return for further evaluation if tinnitus becomes constant, significantly worsens, or is associated with additional symptoms.  Suspected hearing loss She does not report subjective hearing loss, but tinnitus is often associated with mild or age-related hearing loss. Physical examination is normal, and she has not had a recent audiometric evaluation.  - Scheduled audiometric evaluation to objectively assess hearing function. - Advised that symmetric, mild hearing loss is common and typically not concerning; significant asymmetry would prompt  further evaluation. - Instructed to follow up if audiometry reveals abnormal findings or if new symptoms develop.  Kynzi Levay W Rufus Cypert 11/06/2024, 4:25 PM      [1]  Allergies Allergen Reactions   Benzoyl Peroxide     Crab [Shellfish Allergy ]    Flagyl  [Metronidazole ] Rash

## 2024-12-06 ENCOUNTER — Ambulatory Visit (INDEPENDENT_AMBULATORY_CARE_PROVIDER_SITE_OTHER): Admitting: Audiology

## 2025-04-07 ENCOUNTER — Ambulatory Visit: Admitting: Rheumatology
